# Patient Record
Sex: Male | Born: 1955 | Race: Black or African American | Hispanic: No | Smoking: Current every day smoker
Health system: Southern US, Community
[De-identification: ages and names within clinical notes are randomized; demographics above are authoritative.]

## PROBLEM LIST (undated history)

## (undated) DIAGNOSIS — M25562 Pain in left knee: Secondary | ICD-10-CM

## (undated) DIAGNOSIS — M549 Dorsalgia, unspecified: Secondary | ICD-10-CM

## (undated) DIAGNOSIS — G4733 Obstructive sleep apnea (adult) (pediatric): Secondary | ICD-10-CM

## (undated) DIAGNOSIS — B009 Herpesviral infection, unspecified: Secondary | ICD-10-CM

## (undated) DIAGNOSIS — M1990 Unspecified osteoarthritis, unspecified site: Secondary | ICD-10-CM

## (undated) DIAGNOSIS — A4902 Methicillin resistant Staphylococcus aureus infection, unspecified site: Secondary | ICD-10-CM

## (undated) DIAGNOSIS — I639 Cerebral infarction, unspecified: Secondary | ICD-10-CM

## (undated) DIAGNOSIS — M25561 Pain in right knee: Secondary | ICD-10-CM

## (undated) DIAGNOSIS — F1410 Cocaine abuse, uncomplicated: Secondary | ICD-10-CM

## (undated) HISTORY — DX: Herpesviral infection, unspecified: B00.9

## (undated) HISTORY — DX: Pain in right knee: M25.562

## (undated) HISTORY — PX: CYST REMOVAL NECK: SHX6281

## (undated) HISTORY — DX: Pain in right knee: M25.561

## (undated) HISTORY — DX: Cocaine abuse, uncomplicated: F14.10

## (undated) HISTORY — DX: Dorsalgia, unspecified: M54.9

---

## 2007-01-09 HISTORY — PX: HAND SURGERY: SHX662

## 2008-07-28 ENCOUNTER — Emergency Department (HOSPITAL_COMMUNITY): Admission: EM | Admit: 2008-07-28 | Discharge: 2008-07-28 | Payer: Self-pay | Admitting: Family Medicine

## 2008-10-04 ENCOUNTER — Emergency Department (HOSPITAL_COMMUNITY): Admission: EM | Admit: 2008-10-04 | Discharge: 2008-10-05 | Payer: Self-pay | Admitting: Emergency Medicine

## 2010-03-06 ENCOUNTER — Inpatient Hospital Stay (INDEPENDENT_AMBULATORY_CARE_PROVIDER_SITE_OTHER)
Admission: RE | Admit: 2010-03-06 | Discharge: 2010-03-06 | Disposition: A | Discharge status: Home / Self Care | Payer: Self-pay | Source: Ambulatory Visit | Attending: Family Medicine | Admitting: Family Medicine

## 2010-03-06 DIAGNOSIS — B354 Tinea corporis: Secondary | ICD-10-CM

## 2010-03-06 DIAGNOSIS — T148XXA Other injury of unspecified body region, initial encounter: Secondary | ICD-10-CM

## 2010-03-06 DIAGNOSIS — M549 Dorsalgia, unspecified: Secondary | ICD-10-CM

## 2010-06-22 ENCOUNTER — Observation Stay (HOSPITAL_COMMUNITY)
Admission: EM | Admit: 2010-06-22 | Discharge: 2010-06-23 | Disposition: A | Discharge status: Home / Self Care | Payer: Self-pay | Attending: Internal Medicine | Admitting: Internal Medicine

## 2010-06-22 DIAGNOSIS — F1010 Alcohol abuse, uncomplicated: Secondary | ICD-10-CM | POA: Insufficient documentation

## 2010-06-22 DIAGNOSIS — E7800 Pure hypercholesterolemia, unspecified: Secondary | ICD-10-CM | POA: Insufficient documentation

## 2010-06-22 DIAGNOSIS — R21 Rash and other nonspecific skin eruption: Secondary | ICD-10-CM | POA: Insufficient documentation

## 2010-06-22 DIAGNOSIS — F17200 Nicotine dependence, unspecified, uncomplicated: Secondary | ICD-10-CM | POA: Insufficient documentation

## 2010-06-22 DIAGNOSIS — R0789 Other chest pain: Principal | ICD-10-CM | POA: Insufficient documentation

## 2010-06-22 DIAGNOSIS — M25519 Pain in unspecified shoulder: Secondary | ICD-10-CM | POA: Insufficient documentation

## 2010-06-22 DIAGNOSIS — F1411 Cocaine abuse, in remission: Secondary | ICD-10-CM | POA: Insufficient documentation

## 2010-06-23 ENCOUNTER — Emergency Department (HOSPITAL_COMMUNITY): Payer: Self-pay

## 2010-06-23 ENCOUNTER — Inpatient Hospital Stay (HOSPITAL_COMMUNITY): Payer: Self-pay

## 2010-06-23 DIAGNOSIS — I200 Unstable angina: Secondary | ICD-10-CM

## 2010-06-23 LAB — COMPREHENSIVE METABOLIC PANEL
ALT: 16 U/L (ref 0–53)
AST: 17 U/L (ref 0–37)
Albumin: 3.8 g/dL (ref 3.5–5.2)
Alkaline Phosphatase: 70 U/L (ref 39–117)
BUN: 11 mg/dL (ref 6–23)
Calcium: 9.1 mg/dL (ref 8.4–10.5)
Calcium: 9.3 mg/dL (ref 8.4–10.5)
Creatinine, Ser: 0.67 mg/dL (ref 0.50–1.35)
Creatinine, Ser: 0.81 mg/dL (ref 0.4–1.5)
GFR calc Af Amer: >60 mL/min (ref >60)
Potassium: 3.5 mEq/L (ref 3.5–5.1)
Sodium: 138 mEq/L (ref 135–145)
Total Protein: 6.9 g/dL (ref 6.0–8.3)
Total Protein: 7.5 g/dL (ref 6.0–8.3)

## 2010-06-23 LAB — CARDIAC PANEL(CRET KIN+CKTOT+MB+TROPI)
CK, MB: 3.9 ng/mL (ref 0.3–4.0)
CK, MB: 3.6 ng/mL (ref 0.3–4.0)
Relative Index: 1.3 (ref 0.0–2.5)
Relative Index: 1.3 (ref 0.0–2.5)
Total CK: 309 U/L — ABNORMAL HIGH (ref 7–232)
Troponin I: <0.3 ng/mL (ref <0.30)

## 2010-06-23 LAB — CBC
HCT: 43 % (ref 39.0–52.0)
HCT: 44.1 % (ref 39.0–52.0)
Hemoglobin: 15.2 g/dL (ref 13.0–17.0)
RBC: 4.89 MIL/uL (ref 4.22–5.81)
RDW: 13.1 % (ref 11.5–15.5)
WBC: 6.2 10*3/uL (ref 4.0–10.5)
WBC: 7.1 10*3/uL (ref 4.0–10.5)

## 2010-06-23 LAB — LIPID PANEL
HDL: 53 mg/dL (ref >39)
LDL Cholesterol: 110 mg/dL — ABNORMAL HIGH (ref 0–99)
Total CHOL/HDL Ratio: 3.4 RATIO
Triglycerides: 83 mg/dL (ref <150)

## 2010-06-23 LAB — CK TOTAL AND CKMB (NOT AT ARMC)
CK, MB: 4 ng/mL (ref 0.3–4.0)
Relative Index: 1.1 (ref 0.0–2.5)

## 2010-06-23 LAB — LIPASE, BLOOD: Lipase: 19 U/L (ref 11–59)

## 2010-06-23 LAB — ETHANOL: Alcohol, Ethyl (B): 41 mg/dL — ABNORMAL HIGH (ref 0–11)

## 2010-07-03 NOTE — H&P (Signed)
NAME:  Harold Colon, BALDING               ACCOUNT NO.:  [id number]  MEDICAL RECORD NO.:  [id number]  LOCATION:                                 FACILITY:  PHYSICIAN:  Eduard Clos, MDDATE OF BIRTH:  10/12/1955  DATE OF ADMISSION: DATE OF DISCHARGE:                             HISTORY & PHYSICAL   PRIMARY CARE PHYSICIAN:  Not known.  The patient does not recall the name, previously is to be [facility] and he is not going to [facility] for more than 2 years now.  CHIEF COMPLAINT:  Chest pain.  HISTORY OF PRESENT ILLNESS:  A  55 year old male with known history of alcoholism and tobacco abuse, previous history of  drug abuse. He presented with complaint of chest pain.  The patient stated the chest pain started around 8:30 p.m. and lasted for 10 minutes with a left- sided anterior chest wall pressure like, he came to the ER.  In the ER, the patient's cardiac enzymes and EKG did not show anything acute.  Atthis time, the patient has been admitted for further workup for his chest pain.  Chest x-ray only showed bronchitic changes at the bases.  The patient denies any shortness of breath.  Denies any nausea, vomiting, abdominal pain, dysuria, discharge, or diarrhea.  Denies any dizziness, loss of consciousness, does have some difficulty moving his right upper extremity which caused intense pain in the shoulder when he does that.  It has been there like for a month's time.  PAST MEDICAL HISTORY: 1. He has some chronic abdominal rash for which he uses cream.  He     does not know the name of the cream. 2. Tobacco abuse and alcohol abuse.  PAST SURGICAL HISTORY:  He has had surgery for his hand.  MEDICATIONS ON ADMISSION:  He uses a cream for the abdominal rash.  SOCIAL HISTORY:  The patient is separated.  Smokes cigarettes, drinks beer every other day used to use cocaine.  He says he has not used cocaine for a year or two now.  FAMILY HISTORY:  Significant for  diabetes in his mom.  REVIEW OF SYSTEMS:  As per history of present illness, nothing else significant.  ALLERGIES:  No known drug allergies.  PHYSICAL EXAMINATION:  GENERAL:  The patient examined at bedside not in acute distress. VITAL SIGNS:  Blood pressure 120/86, pulse 70 per minute, temperature 97.1, respirations 18, and O2 sat 94%. HEENT:  Anicteric.  No pallor.  No discharge from ears, eyes, or mouth. CHEST:  Bilateral air entry present.  No rhonchi.  No crepitation. HEART:  S1 and S2 heard. ABDOMEN:  Soft, nontender.  Bowel sounds heard.  There is a rash in the lower part of the abdomen which he says it has been there for at least 4 weeks. CNS:  Patient is alert, awake, and oriented to time, place, and person. He is able to move upper and lower extremities but right upper extremity is limited because of the pain in the right shoulder. EXTREMITIES:  There is shoulder pain.  The right side and abduction in the right is only 45% because of the pain.  There is no acute  extremity changes, cyanosis, or clubbing.  LABORATORY DATA:  EKG shows normal sinus rhythm with nonspecific ST-T changes.  Heart rate is around 80 beats per minute.  I did discuss the case with cardiologist.  Chest x-ray shows bronchitic changes at the bases.  CBC:  WBC 7.1, hemoglobin is 14.9, hematocrit 43, platelets 201. Complete metabolic panel sodium 138, potassium 3.5, chloride 103, carbon dioxide 27, glucose of five, BUN 11 creatinine 0.8, total bilirubin is 0.2, alkaline phosphatase 70, AST 20, ALT 18, total protein 7.5, prealbumin 3.8, calcium of 9.3, CK is 351, MB is 4, index is 1.1, troponin less than 0.3.  Alcohol 41.  ASSESSMENT: 1. Chest pain. 2. History of alcohol and tobacco abuse with previous history of drug     abuse. 3. Chronic abdominal rash. 4. Right shoulder pain with decreased abduction of his right.  PLAN: 1. At this time, admit the patient to telemetry. 2. Chest pain.  At this  time, the patient  is chest pain free we will     cycle cardiac markers.  The patient on aspirin.  I am going to     check a D-dimer and lipase level.  We will get a 2-D Echo, check a     drug screen. 3. Alcohol abuse.  We will keep the patient on alcohol withdrawal     protocol use p.o. Ativan along with thiamine.  We will get an x-ray     of his right shoulder.  Further recommendation based on the test     order and clinic course.     Eduard Clos, MD     ANK/MEDQ  D:  06/23/2010  T:  06/23/2010  Job:  [id number]  Electronically Signed by Midge Minium MD on 07/03/2010 07:33:35 AM

## 2010-07-10 ENCOUNTER — Encounter: Payer: Self-pay | Admitting: Cardiovascular Disease

## 2010-07-10 ENCOUNTER — Encounter: Payer: Self-pay | Admitting: *Deleted

## 2010-07-10 NOTE — Discharge Summary (Signed)
NAME:  Harold Colon, Harold Colon               ACCOUNT NO.:  [id number]  MEDICAL RECORD NO.:  [id number]  LOCATION:  3736                         FACILITY:  [facility]  PHYSICIAN:  Marcellus Scott, MD     DATE OF BIRTH:  1955/08/13  DATE OF ADMISSION:  06/22/2010 DATE OF DISCHARGE:  06/23/2010                              DISCHARGE SUMMARY   PRIMARY CARE PHYSICIAN:  The patient does not have one, but has an appointment to be seen at [facility].  DISCHARGE DIAGNOSES: 1. Atypical chest pain, resolved.  Acute coronary artery syndrome     ruled out. 2. Tobacco abuse. 3. Alcohol use. 4. History of cocaine abuse. 5. Bilateral shoulder pains, chronic. 6. Lower abdominal rash.  DISCHARGE MEDICATIONS: 1. Enteric-coated aspirin 81 mg p.o. daily. 2. Tylenol 650 mg p.o. q.4 hourly p.r.n. for pain.  IMAGING: 1. Chest x-ray on June 23, 2010.  Impression, bronchitic changes at     the basis. 2. Right shoulder x-ray shows degenerative changes at the     acromioclavicular joint and rotator cuff insertion.  No acute     findings. 3. A 2-D echocardiogram shows mild left ventricular hypertrophy, left     ventricular ejection fraction 60%.  Normal wall motion and no     regional wall motion abnormalities.  LABORATORY DATA:  TSH 1.756, magnesium 1.9, lipase 19.  Cardiac enzymes cycled and only shows minimally elevated CK, but negative troponin and CK-MB.  LDL is 110.  Comprehensive metabolic panel only significant for albumin of 3.4, D-dimer negative at 0.30.  CBCs within normal limits. Blood alcohol level on admission was 41 mg/dL.  His EKG is normal sinus rhythm, normal axis, nonspecific ST-T changes.  CONSULTATIONS:  None.  DIET:  Heart-healthy.  ACTIVITIES:  Ad lib.  COMPLAINTS:  Mild right-sided shoulder pain which is chronic.  PHYSICAL EXAMINATION:  GENERAL:  The patient is in no obvious distress. VITAL SIGNS:  Temperature 97.4 degrees Fahrenheit, pulse 74 per minute, respirations  20 per minute, blood pressure 125/80 and saturating at 96% on room air. RESPIRATORY:  Clear. CARDIOVASCULAR:  First and second heart sounds heard regular. ABDOMEN:  Nondistended, nontender, soft and bowel sounds present.  The patient has a hypopigmented lichenified rash in the belt area in the anterior groin.  This appears chronic with no acute findings. CENTRAL NERVOUS SYSTEM:  The patient is awake, alert, oriented x3 with no focal neurological deficits.  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  He now presented with atypical chest pain.  He indicates that approximately 8:30 last night, he noticed gradual onset of left shoulder pain and then anterior chest pressure-like discomfort with no associated dyspnea or diaphoresis or wheezing or cough or fever.  He sat down on the chair. Elevated both his shoulders and relaxed and indicates that after about 10-15 minutes his chest pain subsided spontaneously and has not returned since.  He was concerned and  presented to the emergency room and was admitted to rule out acute coronary syndrome.  He indicates that he has chronic both shoulder pains right greater than the left.  He has had no further chest pain.  His vital signs are stable.  His EKG does not show any acute changes.  His echocardiogram findings are as indicated above. This dictator has discussed with [facility] cardiologist and we have agreed to get an outpatient stress test and followup with the [facility] cardiologist.  This dictator has called the [facility] coordinator who will arrange for the outpatient stress test and followup with Cardiology, MD. The patient has been counseled regarding  cessation from alcohol, tobacco and drug abuse.  He verbalizes understanding.  He indicates that he drinks 3 cans of beer maybe every other day and does not do any hard liquor.  DISPOSITION:  The patient will be discharged home in stable condition pending negative third set of cardiac enzymes.  FOLLOWUP RECOMMENDATIONS: 1. With [facility] on July 6 at 10:30 a.m. for     eligibility. 2. With Dr. Jolaine Click at [facility] on July 10 at 10:00     a.m. for followup appointment. 3. With [facility] Cardiology.  The MD's office will call with an     appointment for outpatient stress test and a cardiologist followup.  Time taken in coordinating this discharge is 30 minutes.     Marcellus Scott, MD     AH/MEDQ  D:  06/23/2010  T:  06/24/2010  Job:  [id number]  cc:   [facility]  Electronically Signed by Marcellus Scott MD on 07/10/2010 11:28:59 PM

## 2010-07-11 ENCOUNTER — Ambulatory Visit (INDEPENDENT_AMBULATORY_CARE_PROVIDER_SITE_OTHER): Payer: Self-pay | Admitting: Cardiovascular Disease

## 2010-07-11 ENCOUNTER — Encounter: Payer: Self-pay | Admitting: Cardiovascular Disease

## 2010-07-11 VITALS — BP 116/80 | HR 70 | Ht 73.0 in | Wt 247.0 lb

## 2010-07-11 DIAGNOSIS — R079 Chest pain, unspecified: Secondary | ICD-10-CM | POA: Insufficient documentation

## 2010-07-11 DIAGNOSIS — Z720 Tobacco use: Secondary | ICD-10-CM

## 2010-07-11 DIAGNOSIS — F17200 Nicotine dependence, unspecified, uncomplicated: Secondary | ICD-10-CM

## 2010-07-11 DIAGNOSIS — R072 Precordial pain: Secondary | ICD-10-CM

## 2010-07-11 NOTE — Assessment & Plan Note (Signed)
Tobacco cessation counseling done

## 2010-07-11 NOTE — Patient Instructions (Signed)
Your physician recommends that you schedule a follow-up appointment in: 2 months with Dr. Excell Seltzer.  Your physician has requested that you have en exercise stress myoview. For further information please visit [email or web address] Please follow instruction sheet, as given.

## 2010-07-11 NOTE — Progress Notes (Signed)
HPI:  This is a 55 year old gentleman referred for initial evaluation of chest pain.  He was hospitalized June 14 at [facility] for chest pain. The patient initially developed pain in his shoulder but he then described a pressure-like sensation across his anterior chest. He was ruled out for myocardial infarction. An echocardiogram demonstrated no regional wall motion abnormalities. He was discharged the following day and to followup stress test was recommended. The patient was not seen by cardiology in the hospital.  He continues to complain of right shoulder pain, predominantly with raising her right arm. He denies chest pain or pressure with exertion. He does admit to chronic dyspnea with exertion. He continues to smoke cigarettes. The patient has a history of cocaine use but has not used in a few years. He denies leg swelling, palpitations, lightheadedness, or syncope. He has no past history of cardiac disease.  Outpatient Encounter Prescriptions as of 07/11/2010  Medication Sig Dispense Refill  . aspirin 81 MG tablet Take 81 mg by mouth daily.          Review of patient's allergies indicates not on file.  Past Medical History  Diagnosis Date  . Chest pain   . Cocaine abuse   . Herpes     Past Surgical History  Procedure Date  . Hand surgery     History   Social History  . Marital Status: Legally Separated    Spouse Name: N/A    Number of Children: N/A  . Years of Education: N/A   Occupational History  . Not on file.   Social History Main Topics  . Smoking status: Former Smoker    Types: Cigarettes  . Smokeless tobacco: Not on file  . Alcohol Use: Not on file  . Drug Use: Not on file  . Sexually Active: Not on file   Other Topics Concern  . Not on file   Social History Narrative    The patient is separated.  Smokes cigarettes, drinks  beer every other day used to use cocaine.  He says he has not used  cocaine for a year or two now.     Family History  Problem  Relation Age of Onset  . Diabetes      ROS: General: no fevers/chills/night sweats Eyes: no blurry vision, diplopia, or amaurosis ENT: no sore throat or hearing loss Resp: no cough, wheezing, or hemoptysis CV: no edema or palpitations GI: no abdominal pain, nausea, vomiting, diarrhea, or constipation GU: no dysuria, frequency, or hematuria Skin: no rash Neuro: no headache, numbness, tingling, or weakness of extremities Musculoskeletal: no joint pain or swelling Heme: no bleeding, DVT, or easy bruising Endo: no polydipsia or polyuria  BP 116/80  Pulse 70  Ht 6\' 1"  (1.854 m)  Wt 247 lb (112.038 kg)  BMI 32.59 kg/m2  PHYSICAL EXAM: Pt is alert and oriented, WD, WN, her weight African American male in no distress. HEENT: normal Neck: JVP normal. Carotid upstrokes normal without bruits. No thyromegaly. Lungs: equal expansion, clear bilaterally CV: Apex is discrete and nondisplaced, RRR without murmur or gallop Abd: soft, NT, +BS, no bruit, no hepatosplenomegaly Back: no CVA tenderness Ext: no C/C/E        Femoral pulses 2+= without bruits        DP/PT pulses intact and = Skin: warm and dry without rash Neuro: CNII-XII intact             Strength intact = bilaterally  EKG:  Normal sinus rhythm with possible  anterior infarction age undetermined, heart rate 70 beats per minute.  ASSESSMENT AND PLAN:

## 2010-07-11 NOTE — Assessment & Plan Note (Signed)
Patient has chest pain with both typical and atypical features. His EKG is abnormal although nondiagnostic. He has cardiac vascular risk factors of tobacco abuse and obesity. There is a family history of congestive heart failure but no premature CAD. Recommend an exercise Myoview stress scan to rule out significant inducible ischemia. The patient should continue on antiplatelet therapy with aspirin. Further plans pending results of the stress test. His echocardiogram results were reviewed.

## 2010-07-18 ENCOUNTER — Encounter (HOSPITAL_COMMUNITY): Payer: Self-pay | Admitting: Radiology

## 2010-07-20 ENCOUNTER — Encounter (HOSPITAL_COMMUNITY): Payer: Self-pay | Admitting: Radiology

## 2010-07-20 ENCOUNTER — Ambulatory Visit (HOSPITAL_COMMUNITY): Payer: Self-pay | Attending: Cardiovascular Disease | Admitting: Radiology

## 2010-07-20 DIAGNOSIS — R0989 Other specified symptoms and signs involving the circulatory and respiratory systems: Secondary | ICD-10-CM

## 2010-07-26 ENCOUNTER — Ambulatory Visit (HOSPITAL_COMMUNITY): Payer: Self-pay | Attending: Cardiovascular Disease | Admitting: Radiology

## 2010-07-26 VITALS — Ht 73.0 in | Wt 240.0 lb

## 2010-07-26 DIAGNOSIS — R079 Chest pain, unspecified: Secondary | ICD-10-CM

## 2010-07-26 DIAGNOSIS — R0602 Shortness of breath: Secondary | ICD-10-CM

## 2010-07-26 DIAGNOSIS — R072 Precordial pain: Secondary | ICD-10-CM | POA: Insufficient documentation

## 2010-07-26 DIAGNOSIS — R9431 Abnormal electrocardiogram [ECG] [EKG]: Secondary | ICD-10-CM

## 2010-07-26 DIAGNOSIS — R0989 Other specified symptoms and signs involving the circulatory and respiratory systems: Secondary | ICD-10-CM

## 2010-07-26 MED ORDER — TECHNETIUM TC 99M TETROFOSMIN IV KIT: 33.0000 | PACK | Freq: Once | INTRAVENOUS | Status: AC | PRN

## 2010-07-26 MED ORDER — TECHNETIUM TC 99M TETROFOSMIN IV KIT
33.0000 | PACK | Freq: Once | INTRAVENOUS | Status: AC | PRN
  Administered 2010-07-26: 33 via INTRAVENOUS

## 2010-07-26 NOTE — Progress Notes (Signed)
[facility] SITE 3 NUCLEAR MED [address] [place] [address] [phone]  Cardiology Nuclear Med Study  Harold Colon is a 55 y.o. male [id number] 1955/05/09   Nuclear Med Background Indication for Stress Test:  Evaluation for Ischemia and [facility] 06/22/10 CP R/O MI History: 06/22/10 Echo: NL  Cardiac Risk Factors: Smoker  Symptoms:  Chest Pain, DOE, Palpitations and SOB   Nuclear Pre-Procedure Caffeine/Decaff Intake:  None NPO After: 7:00pm   Lungs:  clear IV 0.9% NS with Angio Cath:  22g  IV Site: R Hand  IV Started by:  Doyne Keel, CNMT  Chest Size (in):  48in Cup Size: n/a  Height: 6\' 1"  (1.854 m)  Weight:  240 lb (108.863 kg)  BMI:  Body mass index is 31.66 kg/(m^2). Tech Comments:  meds ok.  This patient had very blunted BP's.    Nuclear Med Study 1 or 2 day study: 2 day  Stress Test Type:  Stress  Reading MD: Marca Ancona, MD  Order Authorizing Provider:  Dayle Points, MD  Resting Radionuclide: Technetium 63m Tetrofosmin  Resting Radionuclide Dose: 33.0 mCi   Stress Radionuclide:  Technetium 37m Tetrofosmin  Stress Radionuclide Dose: 33.0 mCi           Stress Protocol Rest HR: 86 Stress HR: 148  Rest BP: 103/74 Stress BP: 169/101  Exercise Time (min): 9:00 METS: 10.40   Predicted Max HR: 167 bpm % Max HR: 88.62 bpm Rate Pressure Product: 21308   Dose of Adenosine (mg):  n/a Dose of Lexiscan: n/a mg  Dose of Atropine (mg): n/a Dose of Dobutamine: n/a mcg/kg/min (at max HR)  Stress Test Technologist: Milana Na, EMT-P  Nuclear Technologist:  Domenic Polite, CNMT     Rest Procedure:  Myocardial perfusion imaging was performed at rest 45 minutes following the intravenous administration of Technetium 55m Tetrofosmin. Rest ECG: NSR with non-specific ST-T wave changes  Stress Procedure:  The patient exercised for 9:00.  The patient stopped due to fatigue and denied any chest pain.  There were no significant ST-T wave changes. This  patient had very blunted BPs.  Technetium 45m Tetrofosmin was injected at peak exercise and myocardial perfusion imaging was performed after a brief delay. Stress ECG: No significant change from baseline ECG  QPS Raw Data Images:  Normal; no motion artifact; normal heart/lung ratio. Stress Images:  Small apical perfusion defect.  Rest Images:  Normal homogeneous uptake in all areas of the myocardium. Subtraction (SDS):  Small reversible apical perfusion defect. Transient Ischemic Dilatation (Normal <1.22):  0.63 Lung/Heart Ratio (Normal <0.45):  0.37  Quantitative Gated Spect Images QGS EDV:  82 ml QGS ESV:  29 ml  QGS cine images:    QGS EF: 64%  Impression Exercise Capacity:  Good exercise capacity. BP Response:  Blunted blood pressure response with exercise.  Clinical Symptoms:  Fatigue, no chest pain.  ECG Impression:  No significant ST segment change suggestive of ischemia. Comparison with Prior Nuclear Study: No images to compare  Overall Impression:  Low risk stress nuclear study.  Small reversible apical perfusion defect, ischemia versus shifting attenuation.  Normal LV systolic function.   [name] [employer]

## 2010-07-27 NOTE — Progress Notes (Addendum)
nuc med report routed to Dr. Excell Seltzer 07/27/10 Harold Colon  No significant ischemia seen on Myoview. No further CV workup required.

## 2010-08-02 NOTE — Progress Notes (Signed)
Low risk nuclear study. No further CV eval required.

## 2010-08-03 NOTE — Progress Notes (Signed)
Attempted to call pt with results but voicemail not set up and I was unable to leave message.  No other number listed for pt.

## 2010-09-06 NOTE — Progress Notes (Signed)
Have attempted times two to call pt but no voicemail set up.  Pt has appointment scheduled with Dr. Excell Seltzer on Sept. 4, 2012.

## 2010-09-12 ENCOUNTER — Ambulatory Visit: Payer: Self-pay | Admitting: Cardiovascular Disease

## 2010-09-13 ENCOUNTER — Encounter: Payer: Self-pay | Admitting: *Deleted

## 2010-09-13 NOTE — Progress Notes (Signed)
Patient no showed for appointment with Dr. Excell Seltzer on Sept. 4, 2012. Letter sent to patient asking him to contact our office to review results.

## 2011-06-08 ENCOUNTER — Encounter (HOSPITAL_COMMUNITY): Payer: Self-pay | Admitting: Emergency Medicine

## 2011-06-08 ENCOUNTER — Inpatient Hospital Stay (HOSPITAL_COMMUNITY)
Admission: EM | Admit: 2011-06-08 | Discharge: 2011-06-10 | DRG: 378 | Disposition: A | Discharge status: Home / Self Care | Payer: Self-pay | Attending: Family Medicine | Admitting: Family Medicine

## 2011-06-08 ENCOUNTER — Emergency Department (INDEPENDENT_AMBULATORY_CARE_PROVIDER_SITE_OTHER)
Admission: EM | Admit: 2011-06-08 | Discharge: 2011-06-08 | Disposition: A | Discharge status: Nursing Home (Includes ICF) | Payer: Self-pay | Source: Home / Self Care | Attending: Emergency Medicine | Admitting: Emergency Medicine

## 2011-06-08 ENCOUNTER — Encounter (HOSPITAL_COMMUNITY): Payer: Self-pay

## 2011-06-08 DIAGNOSIS — R000 Tachycardia, unspecified: Secondary | ICD-10-CM | POA: Diagnosis present

## 2011-06-08 DIAGNOSIS — IMO0002 Reserved for concepts with insufficient information to code with codable children: Secondary | ICD-10-CM | POA: Diagnosis present

## 2011-06-08 DIAGNOSIS — Z79899 Other long term (current) drug therapy: Secondary | ICD-10-CM

## 2011-06-08 DIAGNOSIS — G4733 Obstructive sleep apnea (adult) (pediatric): Secondary | ICD-10-CM | POA: Diagnosis present

## 2011-06-08 DIAGNOSIS — L0390 Cellulitis, unspecified: Secondary | ICD-10-CM

## 2011-06-08 DIAGNOSIS — K921 Melena: Secondary | ICD-10-CM

## 2011-06-08 DIAGNOSIS — K648 Other hemorrhoids: Secondary | ICD-10-CM | POA: Diagnosis present

## 2011-06-08 DIAGNOSIS — L03113 Cellulitis of right upper limb: Secondary | ICD-10-CM

## 2011-06-08 DIAGNOSIS — Z8719 Personal history of other diseases of the digestive system: Secondary | ICD-10-CM

## 2011-06-08 DIAGNOSIS — K922 Gastrointestinal hemorrhage, unspecified: Secondary | ICD-10-CM

## 2011-06-08 DIAGNOSIS — F1410 Cocaine abuse, uncomplicated: Secondary | ICD-10-CM | POA: Diagnosis present

## 2011-06-08 DIAGNOSIS — L0291 Cutaneous abscess, unspecified: Secondary | ICD-10-CM

## 2011-06-08 DIAGNOSIS — L739 Follicular disorder, unspecified: Secondary | ICD-10-CM | POA: Diagnosis present

## 2011-06-08 DIAGNOSIS — K644 Residual hemorrhoidal skin tags: Secondary | ICD-10-CM | POA: Diagnosis present

## 2011-06-08 DIAGNOSIS — K2980 Duodenitis without bleeding: Secondary | ICD-10-CM | POA: Diagnosis present

## 2011-06-08 DIAGNOSIS — F17200 Nicotine dependence, unspecified, uncomplicated: Secondary | ICD-10-CM | POA: Diagnosis present

## 2011-06-08 DIAGNOSIS — A4902 Methicillin resistant Staphylococcus aureus infection, unspecified site: Secondary | ICD-10-CM | POA: Diagnosis present

## 2011-06-08 HISTORY — DX: Obstructive sleep apnea (adult) (pediatric): G47.33

## 2011-06-08 HISTORY — DX: Personal history of other diseases of the digestive system: Z87.19

## 2011-06-08 HISTORY — DX: Methicillin resistant Staphylococcus aureus infection, unspecified site: A49.02

## 2011-06-08 LAB — DIFFERENTIAL
Basophils Absolute: 0 10*3/uL (ref 0.0–0.1)
Basophils Relative: 0 % (ref 0–1)
Eosinophils Absolute: 0.2 10*3/uL (ref 0.0–0.7)
Eosinophils Relative: 2 % (ref 0–5)
Lymphocytes Relative: 31 % (ref 12–46)
Lymphs Abs: 2.8 10*3/uL (ref 0.7–4.0)
Monocytes Absolute: 0.6 10*3/uL (ref 0.1–1.0)
Monocytes Relative: 7 % (ref 3–12)
Neutro Abs: 5.5 10*3/uL (ref 1.7–7.7)
Neutrophils Relative %: 60 % (ref 43–77)

## 2011-06-08 LAB — BASIC METABOLIC PANEL
BUN: 12 mg/dL (ref 6–23)
CO2: 28 mEq/L (ref 19–32)
Calcium: 9.5 mg/dL (ref 8.4–10.5)
Chloride: 99 mEq/L (ref 96–112)
Creatinine, Ser: 0.87 mg/dL (ref 0.50–1.35)
GFR calc Af Amer: >90 mL/min (ref >90)
GFR calc non Af Amer: >90 mL/min (ref >90)
Glucose, Bld: 126 mg/dL — ABNORMAL HIGH (ref 70–99)
Potassium: 3.7 mEq/L (ref 3.5–5.1)
Sodium: 137 mEq/L (ref 135–145)

## 2011-06-08 LAB — HEPATIC FUNCTION PANEL
ALT: 26 U/L (ref 0–53)
Alkaline Phosphatase: 69 U/L (ref 39–117)
Total Protein: 7.6 g/dL (ref 6.0–8.3)

## 2011-06-08 LAB — CBC
HCT: 41.2 % (ref 39.0–52.0)
Hemoglobin: 14.2 g/dL (ref 13.0–17.0)
MCH: 31.2 pg (ref 26.0–34.0)
MCHC: 34.5 g/dL (ref 30.0–36.0)
MCV: 90.5 fL (ref 78.0–100.0)
Platelets: 204 10*3/uL (ref 150–400)
RBC: 4.55 MIL/uL (ref 4.22–5.81)
RDW: 13.5 % (ref 11.5–15.5)
WBC: 9.1 10*3/uL (ref 4.0–10.5)

## 2011-06-08 LAB — OCCULT BLOOD, POC DEVICE: Fecal Occult Bld: POSITIVE

## 2011-06-08 LAB — PROTIME-INR: INR: 1.07 (ref 0.00–1.49)

## 2011-06-08 LAB — APTT: aPTT: 32 seconds (ref 24–37)

## 2011-06-08 MED ORDER — LIDOCAINE HCL (PF) 1 % IJ SOLN
INTRAMUSCULAR | Status: AC
  Filled 2011-06-08: qty 5

## 2011-06-08 MED ORDER — CEFTRIAXONE SODIUM 1 G IJ SOLR
1.0000 g | Freq: Once | INTRAMUSCULAR | Status: AC
  Administered 2011-06-08: 1 g via INTRAMUSCULAR

## 2011-06-08 NOTE — ED Notes (Signed)
Pt also being sent down primarily for GI bleed, onset ~ 2 weeks ago, started as bright red, now dark red, occult positive at [facility], given rocephin IM at [facility] for cellulitis.

## 2011-06-08 NOTE — H&P (Signed)
KRESTON AHRENDT is an 56 y.o. male.   Chief Complaint:GI bleed, cellulitis HPI: 56 yo male sent to ED from UC presents with right arm cellulitis and GI bleed.  Patient is a very poor historian.  Per pt, he had some gross rectal bleeding about 1 week ago after using a laxative for some mild constipation, and since that time his stools have become darker.  He reports no abdominal or rectal pain.  May have had one episode of a sharp pain in his lower abdomen one time.  No problems with diarrhea or constipation. Stools have been formed.  No nausea or vomiting. No decreased appetite, has been eating and drinking well.  No history of GI bleed or other GI illnesses, although after thinking about it, he may have had this happen before, but isn't sure, and doesn't think he even saw a medical professional.  No h/o diverticulitis. Does not feel like his abdomen is distended, but sometimes feels a little bloated after eating.  Denies EtOH use, but does take 81mg  ASA every day.  And may take 400-800mg  of motrin or 2 Aleve once a day for low back pain.  He denies dizziness, chest pain, SOB, syncope.  He has never had a colonoscopy.  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  Those lesions would come and go, with the cream maybe helping.  He squeezed a lesion on his right forearm about 1 week ago when it looked like a pimple, and now his right arm is red, painful, and swollen for the past few days.  No fevers or chills.  No sensory changes or weakness.  Has never had this before.  Does not think anyone else at the shelter has a rash.  Patient does have a h/o MRSA.   While at the urgent care, he got CTX x1 for his cellulitis, which he thinks improved the redness and swelling.  While in the ED, he had hemoccult positive stool, normal BMET, LFTs, CBC, INR.   Past Medical History  Diagnosis Date  . Chest pain   .  Cocaine abuse last use 2010  . Herpes   . MRSA (methicillin resistant Staphylococcus aureus)   . OSA (obstructive sleep apnea)     Past Surgical History  Procedure Date  . Hand surgery     Family History  Problem Relation Age of Onset  . Diabetes     Social History:  reports that he has quit smoking. His smoking use included Cigarettes. He does not have any smokeless tobacco history on file. He reports that he does not drink alcohol or use illicit drugs. H/o cocaine use, no use since 2010 per pt report.  Currently living at the [facility] No PCP  Allergies: No Known Allergies   (Not in a hospital admission)  Results for orders placed during the hospital encounter of 06/08/11 (from the past 48 hour(s))  CBC     Status: Normal   Collection Time   06/08/11  9:11 PM      Component Value Range Comment   WBC 9.1  4.0 - 10.5 (K/uL)    RBC 4.55  4.22 - 5.81 (MIL/uL)    Hemoglobin 14.2  13.0 - 17.0 (g/dL)    HCT 16.1  09.6 - 04.5 (%)    MCV 90.5  78.0 - 100.0 (fL)    MCH 31.2  26.0 - 34.0 (pg)    MCHC 34.5  30.0 - 36.0 (g/dL)    RDW 40.9  81.1 - 91.4 (%)    Platelets 204  150 - 400 (K/uL)   DIFFERENTIAL     Status: Normal   Collection Time   06/08/11  9:11 PM      Component Value Range Comment   Neutrophils Relative 60  43 - 77 (%)    Neutro Abs 5.5  1.7 - 7.7 (K/uL)    Lymphocytes Relative 31  12 - 46 (%)    Lymphs Abs 2.8  0.7 - 4.0 (K/uL)    Monocytes Relative 7  3 - 12 (%)    Monocytes Absolute 0.6  0.1 - 1.0 (K/uL)    Eosinophils Relative 2  0 - 5 (%)    Eosinophils Absolute 0.2  0.0 - 0.7 (K/uL)    Basophils Relative 0  0 - 1 (%)    Basophils Absolute 0.0  0.0 - 0.1 (K/uL)   BASIC METABOLIC PANEL     Status: Abnormal   Collection Time   06/08/11  9:11 PM      Component Value Range Comment   Sodium 137  135 - 145 (mEq/L)    Potassium 3.7  3.5 - 5.1 (mEq/L)    Chloride 99  96 - 112 (mEq/L)    CO2 28  19 - 32 (mEq/L)    Glucose, Bld 126 (*) 70 - 99  (mg/dL)    BUN 12  6 - 23 (mg/dL)    Creatinine, Ser 7.82  0.50 - 1.35 (mg/dL)    Calcium 9.5  8.4 - 10.5 (mg/dL)    GFR calc non Af Amer >90  >90 (mL/min)    GFR calc Af Amer >90  >90 (mL/min)   APTT     Status: Normal   Collection Time   06/08/11  9:16 PM      Component Value Range Comment   aPTT 32  24 - 37 (seconds)   PROTIME-INR     Status: Normal   Collection Time   06/08/11  9:16 PM      Component Value Range Comment   Prothrombin Time 14.1  11.6 - 15.2 (seconds)    INR 1.07  0.00 - 1.49    TYPE AND SCREEN     Status: Normal   Collection Time   06/08/11  9:25 PM      Component Value Range Comment   ABO/RH(D) O POS      Antibody Screen NEG      Sample Expiration 06/11/2011     HEPATIC FUNCTION PANEL     Status: Normal   Collection Time   06/08/11  9:36 PM      Component Value Range Comment   Total Protein 7.6  6.0 - 8.3 (g/dL)    Albumin 3.8  3.5 - 5.2 (g/dL)    AST 26  0 - 37 (U/L)    ALT 26  0 - 53 (U/L)    Alkaline Phosphatase 69  39 - 117 (U/L)    Total Bilirubin 0.3  0.3 - 1.2 (mg/dL)    Bilirubin, Direct <9.5  0.0 - 0.3 (mg/dL)    Indirect Bilirubin NOT CALCULATED  0.3 - 0.9 (mg/dL)    No results found.  Review of Systems  Constitutional: Negative for fever, chills, weight loss, malaise/fatigue and diaphoresis.  HENT: Negative for sore throat.   Respiratory: Negative for cough, hemoptysis, sputum production and shortness of breath.  Cardiovascular: Positive for leg swelling. Negative for chest pain and palpitations.  Gastrointestinal: Positive for blood in stool and melena. Negative for heartburn, nausea, vomiting, abdominal pain, diarrhea and constipation.  Genitourinary: Negative for dysuria.  Musculoskeletal: Positive for back pain. Negative for falls.  Skin: Positive for rash.  Neurological: Negative for dizziness, sensory change, focal weakness, weakness and headaches.    There were no vitals taken for this visit. Physical Exam  Constitutional: He  appears well-developed and well-nourished. No distress.  HENT:  Head: Normocephalic and atraumatic.  Right Ear: External ear normal.  Left Ear: External ear normal.  Nose: Nose normal.  Mouth/Throat: Oropharynx is clear and moist. No oropharyngeal exudate.  Eyes: Conjunctivae are normal. Pupils are equal, round, and reactive to light.  Neck: Normal range of motion. Neck supple. No thyromegaly present.  Cardiovascular: Regular rhythm, normal heart sounds and intact distal pulses.  Tachycardia present.   No murmur heard. Respiratory: Effort normal. No accessory muscle usage. No respiratory distress. He has wheezes.  GI: Soft. Bowel sounds are normal. He exhibits distension. He exhibits no ascites. There is no tenderness. There is no rebound and no guarding.  Genitourinary: Rectal exam shows external hemorrhoid. Guaiac positive stool.       Small, nonbleeding external hemorrhoids  >20 lesions of various stages over suprapubic, pubic, and groin areas- some papules, some healing lesion, no drainage or erythema, no fluctuance, no inguinal LAD  Musculoskeletal: Normal range of motion. He exhibits no edema and no tenderness.  Lymphadenopathy:    He has no cervical adenopathy.  Neurological: He is alert.  Skin: Skin is warm and dry. Rash noted. There is erythema.        Assessment/Plan 56 yo male with h/o MRSA p/w 5 day h/o rectal bleeding and 1 week h/o rash/cellulitis  1. GI bleed: nonbleeding external hemorrhoids, hemoccult +, denies EtOH use, has never had colonoscopy, does have chronic NSAID use -Admit to monitor overnight for hemodynamic stability  -Consider GI consult in AM --> more likely to be lower GI bleed and will need outpt colonoscopy, but GI may want to do EGD with h/o NSAID use -No abdominal pain or clear source of bleed -Hemoglobin stable, 14.2 (unchanged from 1 year ago); INR normal (1) -Will make NPO -Protonix IV -CT abd due to distension on exam   2. Cellulitis/rash:  pt with h/o MRSA -S/p CTX x1 at urgent care which seems to be helping per pt report -R arm has 1 lesion with surrounding cellulitis-like changes, no fluctuance or drainable abscess -groin area appears to be folliculitis and chronic in nature -Vancomycin while NPO, then will change to doxycycyline  3. Tachycardia: low 100s, unclear source -pt denies current cocaine use, but h/o -will get UDS -IVF for possible dehydration, although Cr normal and pt does not appear clinically dry  4. Tobacco use: 1 ppd -nicotine patch   FEN/GI: NPO (GI bleed), NS @ 100cc/hr Ppx: SCDs (GI bleed), protonix Dispo: admit to inpatient, monitor on tele, d/c pending clinical improvement  Silviano Neuser 06/08/2011, 10:24 PM

## 2011-06-08 NOTE — ED Provider Notes (Signed)
History     CSN: [id number]  Arrival date & time 06/08/11  2039   First MD Initiated Contact with Patient 06/08/11 2101      Chief Complaint  Patient presents with  . GI Bleeding  . Cellulitis    (Consider location/radiation/quality/duration/timing/severity/associated sxs/prior treatment) HPI Comments: Patient with a history of substance abuse of cocaine and recurrent skin infections presents emergency department from urgent care for evaluation of GI bleed and right arm cellulitis.  Patient states that about a week ago he began having gross bright blood in his stool and this has gradually dark and to current black stools.  Patient does have a history of external hemorrhoids and evaluation in urgent care included a positive Hemoccult study and no active bleeding on anoscopy.patient denies any abdominal pain, nausea, vomiting, fevers, night sweats, chills, diarrhea, or constipation.  In addition patient complains of right arm tenderness around previous abscess with surrounding erythema and tinea infection of the groin area.  Patient has no other complaints at this time.    The history is provided by the patient.    Past Medical History  Diagnosis Date  . Chest pain   . Cocaine abuse last use 2010  . Herpes   . MRSA (methicillin resistant Staphylococcus aureus)   . OSA (obstructive sleep apnea)     Past Surgical History  Procedure Date  . Hand surgery     Family History  Problem Relation Age of Onset  . Diabetes      History  Substance Use Topics  . Smoking status: Former Smoker    Types: Cigarettes  . Smokeless tobacco: Not on file  . Alcohol Use: No      Review of Systems  All other systems reviewed and are negative.    Allergies  Review of patient's allergies indicates no known allergies.  Home Medications  No current outpatient prescriptions on file.  There were no vitals taken for this visit.  Physical Exam  Constitutional: He is oriented to person,  place, and time. He appears well-developed and well-nourished. No distress.  HENT:  Head: Normocephalic and atraumatic.  Mouth/Throat: Oropharynx is clear and moist. No oropharyngeal exudate.  Eyes: EOM are normal. Pupils are equal, round, and reactive to light.  Neck: Normal range of motion. Neck supple. No tracheal deviation present. No thyromegaly present.  Cardiovascular: Regular rhythm, normal heart sounds and intact distal pulses.        Tachycardic, 116. No aberancy on auscultation.   Pulmonary/Chest: Effort normal and breath sounds normal. No stridor. No respiratory distress. He has no wheezes.  Abdominal:       Obese, distended abdomen, non rigid & no ttp. Non pulsatile aorta.   Genitourinary:       External hemorrhoids, not bleeding. Hyperpigmented skin with scattered pustules in groin area. No active draining. Scrotum non tender.   Musculoskeletal: Normal range of motion. He exhibits no edema and no tenderness.  Neurological: He is alert and oriented to person, place, and time. Coordination normal.  Skin: Skin is warm and dry. Rash noted. He is not diaphoretic. There is erythema. No pallor.       Right forearm with warmth and erythema, ttp, no fluctuance or purulent draining. Tender pustules in right axilla. Hx of MRSA  Psychiatric: He has a normal mood and affect. His behavior is normal.    ED Course  Procedures (including critical care time)   Labs Reviewed  CBC  DIFFERENTIAL  APTT  PROTIME-INR  TYPE AND  SCREEN  BASIC METABOLIC PANEL  HEPATIC FUNCTION PANEL   No results found.   No diagnosis found.    MDM  Cellulitis, tachycardia, GI bleed (+ hemoccult via UCC)  The patient appears reasonably stabilized for admission considering the current resources, flow, and capabilities available in the ED at this time, and I doubt any other [facility] requiring further screening and/or treatment in the ED prior to admission.         Jaci Carrel, PA-C 06/08/11  [address], PA-C 06/08/11 2305

## 2011-06-08 NOTE — ED Notes (Signed)
PT. SENT FROM [facility] FOR FURTHER EVALUATION OF RIGHT FOREARM SWELLING ONSET THIS MORNING . DENIES FEVER .

## 2011-06-08 NOTE — ED Notes (Signed)
Pt called this RN back into room, states "I did have some pain in my stomach after I ate some food a couple days ago." Very vague.

## 2011-06-08 NOTE — ED Notes (Signed)
Pt c/o abcess to R forearm onset approx 5 days ago.  Pt also c/o generalized bumps to L axillae and L groin and surpapubic abdomen.

## 2011-06-08 NOTE — ED Provider Notes (Signed)
History     CSN: [id number]  Arrival date & time 06/08/11  8469   First MD Initiated Contact with Patient 06/08/11 1832      Chief Complaint  Patient presents with  . Recurrent Skin Infections    (Consider location/radiation/quality/duration/timing/severity/associated sxs/prior treatment) HPI Comments: Patient presents with about one week of multiple pustular lesions as starting in his groin, now on his right trunk, right leg the. States that he quotes "squoze" a lesion on his right forearm, and now his entire forearm is erythematous, painful, and swollen. No weakness, numbness, fevers. He is not a diabetic.  He also reports painless bloody bowel movements for the past 4 days. States it started out as bright red, and has now progressively become darker. Patient states that it is now dark red, almost black in color. He reports some upper abdominal "fullness" but no abdominal pain. No abdominal distention. No urinary complaints. No weakness, chest pain, shortness of breath, presyncope or syncope. He is a smoker, and has a history of cocaine abuse, last use 3 years ago. He does not use NSAIDs, and denies any other medications. He is currently on any aspirin 81 mg daily for atypical chest pain. Family history significant for father having colon cancer. Of note, patient is currently living at the [facility].  ROS as noted in HPI. All other ROS negative.   Patient is a 56 y.o. male presenting with rash and hematochezia. The history is provided by the patient. No language interpreter was used.  Rash  This is a new problem. The current episode started more than 2 days ago. The problem has been gradually worsening. The problem is associated with an unknown factor. There has been no fever. The rash is present on the groin, right arm and trunk. Associated symptoms include pain. Pertinent negatives include no blisters, no itching and no weeping. He has tried nothing for the symptoms. The  treatment provided no relief.  Rectal Bleeding  The current episode started 3 to 5 days ago. The onset was sudden. The problem has been gradually worsening. The patient is experiencing no pain. The stool is described as bloody. There was no prior successful therapy. There was no prior unsuccessful therapy. Associated symptoms include hemorrhoids and rash. Pertinent negatives include no anorexia, no fever, no abdominal pain, no diarrhea, no hematemesis, no nausea, no rectal pain, no vomiting, no chest pain and no difficulty breathing. His past medical history does not include abdominal surgery, inflammatory bowel disease, recent abdominal injury, recent antibiotic use, recent change in diet or a recent illness.    Past Medical History  Diagnosis Date  . Chest pain   . Cocaine abuse last use 2010  . Herpes   . MRSA (methicillin resistant Staphylococcus aureus)   . OSA (obstructive sleep apnea)     Past Surgical History  Procedure Date  . Hand surgery     Family History  Problem Relation Age of Onset  . Diabetes      History  Substance Use Topics  . Smoking status: Former Smoker    Types: Cigarettes  . Smokeless tobacco: Not on file  . Alcohol Use: No      Review of Systems  Constitutional: Negative for fever.  Cardiovascular: Negative for chest pain.  Gastrointestinal: Positive for hematochezia and hemorrhoids. Negative for nausea, vomiting, abdominal pain, diarrhea, rectal pain, anorexia and hematemesis.  Skin: Positive for rash. Negative for itching.    Allergies  Review of patient's allergies indicates no known  allergies.  Home Medications   Current Outpatient Rx  Name Route Sig Dispense Refill  . ASPIRIN 81 MG PO TABS Oral Take 81 mg by mouth daily.        BP 143/92  Pulse 116  Temp(Src) 99.3 F (37.4 C) (Oral)  Resp 28  SpO2 98%  Physical Exam  Nursing note and vitals reviewed. Constitutional: He is oriented to person, place, and time. He appears  well-developed and well-nourished.  HENT:  Head: Normocephalic and atraumatic.  Eyes: Conjunctivae and EOM are normal.  Neck: Normal range of motion.  Cardiovascular: Regular rhythm and normal heart sounds.  Tachycardia present.   Pulmonary/Chest: Effort normal and breath sounds normal. No respiratory distress.  Abdominal: Soft. Normal appearance. He exhibits no distension. Bowel sounds are decreased. There is no tenderness. There is no rigidity, no rebound, no guarding and no CVA tenderness.       obese  Genitourinary: Rectal exam shows external hemorrhoid. Rectal exam shows no internal hemorrhoid, no fissure, no mass, no tenderness and anal tone normal.          Symmetric, flat, hyperpigmented rash consistent with tinea in groin. Several scattered pustules and groin as well. No active bleeding seen on anoscopy. Soft stool in rectal vault  Musculoskeletal: Normal range of motion.  Neurological: He is alert and oriented to person, place, and time.  Skin: Skin is warm and dry.       Erythema, tenderness, increased temperature right forearm. No lymphangitis. Sensation grossly intact. RP 2+. Skin is currently intact.  Multiple tender pustules in right axilla. No expressible purulent drainage. Scattered pustules on right chest and torso, c/w MRSA infection.  Psychiatric: He has a normal mood and affect. His behavior is normal.    ED Course  Procedures (including critical care time)   Labs Reviewed  OCCULT BLOOD, POC DEVICE  OCCULT BLOOD X 1 CARD TO LAB, STOOL   No results found.   1. Cellulitis of arm, right   2. GIB (gastrointestinal bleeding)     MDM  Patient originally presented for skin infection in his groin, arm, chest which appears to be multiple MRSA infections. He has a cellulitis of his right arm. Gave 1 g of Rocephin for this. He also has a history that is concerning for GI bleed.. He has external hemorrhoids, which are not bleeding. No active bleeding seen on anoscopy.  Hemoccult was positive. Abdomen is otherwise benign. He is tachycardic, but normotensive. Afebrile. Transferring to the ER to rule out lower GI bleed. He may also benefit from the cellulitis protocol.   Luiz Blare, MD 06/08/11 2031

## 2011-06-08 NOTE — ED Notes (Signed)
Pt states he has been having red-reddish/black bm x2wks. Denies abd pain. States his stomach feels more bloated than normal, abd appears distended. Pt states his rash pain "comes and goes and hurts at times." Vesicular rash noted to chest, right arm and axilla, and groin.

## 2011-06-09 ENCOUNTER — Inpatient Hospital Stay (HOSPITAL_COMMUNITY): Payer: Self-pay

## 2011-06-09 ENCOUNTER — Encounter (HOSPITAL_COMMUNITY): Payer: Self-pay | Admitting: Radiology

## 2011-06-09 LAB — BASIC METABOLIC PANEL
BUN: 10 mg/dL (ref 6–23)
CO2: 25 mEq/L (ref 19–32)
GFR calc non Af Amer: >90 mL/min (ref >90)
Glucose, Bld: 97 mg/dL (ref 70–99)
Potassium: 3.9 mEq/L (ref 3.5–5.1)
Sodium: 138 mEq/L (ref 135–145)

## 2011-06-09 LAB — RAPID URINE DRUG SCREEN, HOSP PERFORMED
Amphetamines: NOT DETECTED
Barbiturates: NOT DETECTED
Benzodiazepines: NOT DETECTED

## 2011-06-09 LAB — MRSA PCR SCREENING: MRSA by PCR: POSITIVE — AB

## 2011-06-09 LAB — CBC
Hemoglobin: 14 g/dL (ref 13.0–17.0)
MCH: 30.6 pg (ref 26.0–34.0)
MCHC: 33.9 g/dL (ref 30.0–36.0)
MCV: 90.4 fL (ref 78.0–100.0)
RBC: 4.57 MIL/uL (ref 4.22–5.81)

## 2011-06-09 MED ORDER — ACETAMINOPHEN 650 MG RE SUPP: 650.0000 mg | Freq: Four times a day (QID) | RECTAL | Status: DC | PRN

## 2011-06-09 MED ORDER — NICOTINE 14 MG/24HR TD PT24
14.0000 mg | MEDICATED_PATCH | Freq: Every day | TRANSDERMAL | Status: DC
  Administered 2011-06-09: 14 mg via TRANSDERMAL
  Filled 2011-06-09 (×2): qty 1

## 2011-06-09 MED ORDER — SODIUM CHLORIDE 0.9 % IJ SOLN: 3.0000 mL | Freq: Two times a day (BID) | INTRAMUSCULAR | Status: DC

## 2011-06-09 MED ORDER — MUPIROCIN 2 % EX OINT
1.0000 "application " | TOPICAL_OINTMENT | Freq: Two times a day (BID) | CUTANEOUS | Status: DC
  Administered 2011-06-09 – 2011-06-10 (×3): 1 via NASAL
  Filled 2011-06-09: qty 22

## 2011-06-09 MED ORDER — ONDANSETRON HCL 4 MG PO TABS: 4.0000 mg | ORAL_TABLET | Freq: Four times a day (QID) | ORAL | Status: DC | PRN

## 2011-06-09 MED ORDER — VANCOMYCIN HCL 1000 MG IV SOLR
1250.0000 mg | Freq: Two times a day (BID) | INTRAVENOUS | Status: DC
  Administered 2011-06-09: 1250 mg via INTRAVENOUS
  Filled 2011-06-09 (×3): qty 1250

## 2011-06-09 MED ORDER — ONDANSETRON HCL 4 MG/2ML IJ SOLN: 4.0000 mg | Freq: Four times a day (QID) | INTRAMUSCULAR | Status: DC | PRN

## 2011-06-09 MED ORDER — CEPHALEXIN 500 MG PO CAPS
500.0000 mg | ORAL_CAPSULE | Freq: Four times a day (QID) | ORAL | Status: DC
  Administered 2011-06-09: 500 mg via ORAL
  Filled 2011-06-09: qty 2
  Filled 2011-06-09 (×5): qty 1

## 2011-06-09 MED ORDER — CHLORHEXIDINE GLUCONATE CLOTH 2 % EX PADS
6.0000 | MEDICATED_PAD | Freq: Every day | CUTANEOUS | Status: DC
  Administered 2011-06-09 – 2011-06-10 (×2): 6 via TOPICAL

## 2011-06-09 MED ORDER — ACETAMINOPHEN 325 MG PO TABS: 650.0000 mg | ORAL_TABLET | Freq: Four times a day (QID) | ORAL | Status: DC | PRN

## 2011-06-09 MED ORDER — IOHEXOL 300 MG/ML  SOLN
100.0000 mL | Freq: Once | INTRAMUSCULAR | Status: AC | PRN
  Administered 2011-06-09: 100 mL via INTRAVENOUS

## 2011-06-09 MED ORDER — PEG 3350-KCL-NA BICARB-NACL 420 G PO SOLR
4000.0000 mL | Freq: Once | ORAL | Status: AC
  Administered 2011-06-09: 4000 mL via ORAL
  Filled 2011-06-09: qty 4000

## 2011-06-09 MED ORDER — VANCOMYCIN HCL 1000 MG IV SOLR
2000.0000 mg | Freq: Once | INTRAVENOUS | Status: AC
  Administered 2011-06-09: 2000 mg via INTRAVENOUS
  Filled 2011-06-09: qty 2000

## 2011-06-09 MED ORDER — SODIUM CHLORIDE 0.9 % IV SOLN
INTRAVENOUS | Status: DC
  Administered 2011-06-09 (×2): via INTRAVENOUS
  Administered 2011-06-09: 100 mL/h via INTRAVENOUS

## 2011-06-09 MED ORDER — PNEUMOCOCCAL VAC POLYVALENT 25 MCG/0.5ML IJ INJ
0.5000 mL | INJECTION | INTRAMUSCULAR | Status: DC
  Filled 2011-06-09: qty 0.5

## 2011-06-09 MED ORDER — PANTOPRAZOLE SODIUM 40 MG IV SOLR
40.0000 mg | Freq: Two times a day (BID) | INTRAVENOUS | Status: DC
  Administered 2011-06-09 – 2011-06-10 (×3): 40 mg via INTRAVENOUS
  Filled 2011-06-09 (×4): qty 40

## 2011-06-09 NOTE — Progress Notes (Signed)
Seen and examined.  See my note for today which is in my co sign of the H and PE

## 2011-06-09 NOTE — Consult Note (Signed)
Referring Provider: Dr. Leveda Anna Primary Care Physician:  No primary provider on file. Primary Gastroenterologist:  Gentry Fitz  Reason for Consultation:  GI bleed (melena)  HPI: Harold Colon is a 56 y.o. male admitted for cellulitis, who reports having onset of red blood per rectum about a week ago followed by black stools (formed) daily for a week. Bleeding started after taking a laxative (epson salts) for constipation. Denies any associated N/V/abdominal pain/heartburn/dysphagia. He reports rare episodes of burning in his throat when he lies down to sleep. Denies daily NSAIDs but states he would take an aspirin each day until a while ago. Occasional Aleve/Advil but denies any daily NSAID use. Denies any black stools since being at urgent care/ER. Hgb stable at 14. History of cocaine use none since 2010 per chart history. Denies alcohol use. Never had a colonoscopy. Reports history of external hemorrhoids.  Past Medical History  Diagnosis Date  . Chest pain   . Cocaine abuse last use 2010  . Herpes   . MRSA (methicillin resistant Staphylococcus aureus)   . OSA (obstructive sleep apnea)     Past Surgical History  Procedure Date  . Hand surgery     Prior to Admission medications   Not on File    Scheduled Meds:   . Chlorhexidine Gluconate Cloth  6 each Topical Q0600  . mupirocin ointment  1 application Nasal BID  . nicotine  14 mg Transdermal Daily  . pantoprazole (PROTONIX) IV  40 mg Intravenous Q12H  . pneumococcal 23 valent vaccine  0.5 mL Intramuscular Tomorrow-1000  . sodium chloride  3 mL Intravenous Q12H  . vancomycin  1,250 mg Intravenous Q12H  . vancomycin  2,000 mg Intravenous Once  . DISCONTD: cephALEXin  500 mg Oral Q6H   Continuous Infusions:   . sodium chloride 100 mL/hr at 06/09/11 1008   PRN Meds:.acetaminophen, acetaminophen, iohexol, ondansetron (ZOFRAN) IV, ondansetron  Allergies as of 06/08/2011  . (No Known Allergies)    Family History  Problem  Relation Age of Onset  . Diabetes      History   Social History  . Marital Status: Legally Separated    Spouse Name: N/A    Number of Children: N/A  . Years of Education: N/A   Occupational History  . Not on file.   Social History Main Topics  . Smoking status: Former Smoker    Types: Cigarettes  . Smokeless tobacco: Not on file  . Alcohol Use: No  . Drug Use: No     h/o cocaine abuse last use 2010  . Sexually Active: Not on file   Other Topics Concern  . Not on file   Social History Narrative    The patient is separated.  Smokes cigarettes, drinks  beer every other day used to use cocaine.  He says he has not used  cocaine for a year or two now.     Review of Systems: All negative except as stated above in HPI.  Physical Exam: Vital signs: Filed Vitals:   06/09/11 0600  BP: 115/80  Pulse: 84  Temp: 98.4 F (36.9 C)  Resp: 16   Last BM Date: 06/08/11 General:   Alert,  Well-developed, well-nourished, pleasant and cooperative in NAD Lungs:  Clear throughout to auscultation.   No wheezes, crackles, or rhonchi. No acute distress. Heart:  Regular rate and rhythm; no murmurs, clicks, rubs,  or gallops. Abdomen: soft, nontender, nondistended, positive bowel sounds  Rectal:  Deferred  GI:  Lab Results:  Basename 06/09/11 0515 06/08/11 2111  WBC 7.9 9.1  HGB 14.0 14.2  HCT 41.3 41.2  PLT 210 204   BMET  Basename 06/09/11 0515 06/08/11 2111  NA 138 137  K 3.9 3.7  CL 101 99  CO2 25 28  GLUCOSE 97 126*  BUN 10 12  CREATININE 0.74 0.87  CALCIUM 9.3 9.5   LFT  Basename 06/08/11 2136  PROT 7.6  ALBUMIN 3.8  AST 26  ALT 26  ALKPHOS 69  BILITOT 0.3  BILIDIR <0.1  IBILI NOT CALCULATED   PT/INR  Basename 06/08/11 2116  LABPROT 14.1  INR 1.07     Studies/Results: Ct Abdomen Pelvis W Contrast  06/09/2011  *RADIOLOGY REPORT*  Clinical Data: Abdominal distension.  CT ABDOMEN AND PELVIS WITH CONTRAST  Technique:  Multidetector CT imaging of the  abdomen and pelvis was performed following the standard protocol during bolus administration of intravenous contrast.  Contrast:  100 ml Omnipaque 300  Comparison: None.  Findings: The mild dependent changes in the lung bases.  The gallbladder is contracted, likely due to nonfasting state.  The liver, spleen, pancreas, adrenal glands, and retroperitoneal lymph nodes are unremarkable.  Calcification of the abdominal aorta without aneurysm.  Small sub centimeter parenchymal cysts in the kidneys.  No evidence of solid mass or hydronephrosis.  The stomach, small bowel, and colon are not distended.  The colon is diffusely stool filled without apparent wall thickening.  No free air or free fluid in the abdomen.  Pelvis:  The prostate gland is not enlarged.  No bladder wall thickening.  Small lipoma in the inferior right rectus abdominous muscle.  No free fluid or free air in the pelvis.  The appendix is normal.  No evidence of diverticulitis.  Normal alignment of the lumbar vertebrae with degenerative changes present.  IMPRESSION: No acute process demonstrated in the abdomen or pelvis.  Original Report Authenticated By: Marlon Pel, M.D.    Impression/Plan: 56yo male admitted for GI bleed and cellulitis who has been having melena for the past week. Will plan to do a colonoscopy/egd tomorrow and prep today. Question peptic ulcer disease but needs colonoscopy as well for screening purposes. Doubt he would f/u as an outpt. Also, ischemic colitis also in differential especially  if he has restarted back on cocaine (he denies and UDS negative).    LOS: 1 day   Kary Sugrue C.  06/09/2011, 2:13 PM

## 2011-06-09 NOTE — H&P (Signed)
Seen and examined.  Chart reviewed.  Discussed with both Dr. Fara Boros and Katrinka Blazing. Agree with their management.  Briefly  56 yo male with hx of MRSA presents with two distinct problems.  #1 Multi focal skin infection.  Most pronounced is cellulitis of Rt arm.  Also has folliculitis of pubic area and both axilla.  He has on small folliculitis area on rt shin.  + MRSA again.  Suspect all related.  He has had mild recurrent problems of axilla and groin in the past raising the possibility of hydradenitis suppritivum.  Regardless, no change in management.  #2 GI bleed, hemodynamically stable.  Never had colon cancer screen.  Also, recent NSAID use.  We have consulted GI and I understand they plan both EGD and colonoscopy tomorrow.  Greatly appreciate their help.

## 2011-06-09 NOTE — Progress Notes (Signed)
ANTIBIOTIC CONSULT NOTE - INITIAL  Pharmacy Consult for vancomycin Indication: r/o MRSA cellulitis  No Known Allergies  Patient Measurements: Height: 6\' 1"  (185.4 cm) Weight: 274 lb 7.6 oz (124.5 kg) IBW/kg (Calculated) : 79.9    Vital Signs: Temp: 98.4 F (36.9 C) (06/01 0600) Temp src: Oral (06/01 0256) BP: 115/80 mmHg (06/01 0600) Pulse Rate: 84  (06/01 0600) Intake/Output from previous day:   Intake/Output from this shift:    Labs:  Basename 06/09/11 0515 06/08/11 2111  WBC 7.9 9.1  HGB 14.0 14.2  PLT 210 204  LABCREA -- --  CREATININE 0.74 0.87   Estimated Creatinine Clearance: 145.9 ml/min (by C-G formula based on Cr of 0.74). No results found for this basename: VANCOTROUGH:2,VANCOPEAK:2,VANCORANDOM:2,GENTTROUGH:2,GENTPEAK:2,GENTRANDOM:2,TOBRATROUGH:2,TOBRAPEAK:2,TOBRARND:2,AMIKACINPEAK:2,AMIKACINTROU:2,AMIKACIN:2, in the last 72 hours   Microbiology: No results found for this or any previous visit (from the past 720 hour(s)).  Medical History: Past Medical History  Diagnosis Date  . Chest pain   . Cocaine abuse last use 2010  . Herpes   . MRSA (methicillin resistant Staphylococcus aureus)   . OSA (obstructive sleep apnea)     Medications:  No prescriptions prior to admission   Assessment:  56 yo man, per MD note " Cellulitis/rash: pt with h/o MRSA  -S/p CTX x1 at urgent care which seems to be helping per pt report  -R arm has 1 lesion with surrounding cellulitis-like changes, no fluctuance or drainable abscess  -groin area appears to be folliculitis and chronic in nature  -Vancomycin while NPO, then will change to doxycycyline". His weight is 124.5 kg.  Creat cl > 100 ml/min. Goal of Therapy Vancomycin trough level 10-15 mcg/ml  Plan:  1. Vancomycin 2000 mg IV loading dose 2. Vancomycin 1250 mg IV q12h  3. MD plans to change to oral doxycycline once pt able to take PO Herby Abraham, Pharm.D. [phone] 06/09/2011 7:37 AM

## 2011-06-09 NOTE — ED Provider Notes (Signed)
Medical screening examination/treatment/procedure(s) were conducted as a shared visit with non-physician practitioner(s) and myself.  I personally evaluated the patient during the encounter   Chaya Dehaan, MD 06/09/11 0009 

## 2011-06-09 NOTE — Progress Notes (Signed)
Subjective: Patient states arm still hurts but is feeling a little better.    Objective: Vital signs in last 24 hours: Temp:  [97.7 F (36.5 C)-99.3 F (37.4 C)] 98.4 F (36.9 C) (06/01 0600) Pulse Rate:  [79-116] 84  (06/01 0600) Resp:  [16-28] 16  (06/01 0600) BP: (115-143)/(80-92) 115/80 mmHg (06/01 0600) SpO2:  [93 %-98 %] 97 % (06/01 0600) Weight:  [124.5 kg (274 lb 7.6 oz)] 124.5 kg (274 lb 7.6 oz) (06/01 0310) Weight change:  Last BM Date: 06/08/11  Intake/Output from previous day:   Intake/Output this shift:    Constitutional: He appears well-developed and well-nourished. No distress.  Cardiovascular: Regular rhythm, normal heart sounds and intact distal pulses No murmur heard.  Respiratory: Effort normal. No accessory muscle usage. No respiratory distress.  GI: Soft. Bowel sounds are normal. He exhibits distension. He exhibits no ascites. There is no tenderness. There is no rebound and no guarding.  Guaiac positive stool.  >20 lesions of various stages over suprapubic, pubic, and groin areas- some papules, some healing lesion, no drainage or erythema, no fluctuance, no inguinal LAD  Skin: Skin is warm and dry. Right arm still warm to touch, mild erythema, per H&P note, seems to be improving.    Lab Results:  Basename 06/09/11 0515 06/08/11 2111  WBC 7.9 9.1  HGB 14.0 14.2  HCT 41.3 41.2  PLT 210 204   BMET  Basename 06/09/11 0515 06/08/11 2111  NA 138 137  K 3.9 3.7  CL 101 99  CO2 25 28  GLUCOSE 97 126*  BUN 10 12  CREATININE 0.74 0.87  CALCIUM 9.3 9.5    Studies/Results: Ct Abdomen Pelvis W Contrast  06/09/2011  *RADIOLOGY REPORT*  Clinical Data: Abdominal distension.  CT ABDOMEN AND PELVIS WITH CONTRAST  Technique:  Multidetector CT imaging of the abdomen and pelvis was performed following the standard protocol during bolus administration of intravenous contrast.  Contrast:  100 ml Omnipaque 300  Comparison: None.  Findings: The mild dependent changes  in the lung bases.  The gallbladder is contracted, likely due to nonfasting state.  The liver, spleen, pancreas, adrenal glands, and retroperitoneal lymph nodes are unremarkable.  Calcification of the abdominal aorta without aneurysm.  Small sub centimeter parenchymal cysts in the kidneys.  No evidence of solid mass or hydronephrosis.  The stomach, small bowel, and colon are not distended.  The colon is diffusely stool filled without apparent wall thickening.  No free air or free fluid in the abdomen.  Pelvis:  The prostate gland is not enlarged.  No bladder wall thickening.  Small lipoma in the inferior right rectus abdominous muscle.  No free fluid or free air in the pelvis.  The appendix is normal.  No evidence of diverticulitis.  Normal alignment of the lumbar vertebrae with degenerative changes present.  IMPRESSION: No acute process demonstrated in the abdomen or pelvis.  Original Report Authenticated By: Marlon Pel, M.D.    Medications: I have reviewed the patient's current medications.  Assessment/Plan:  56 yo male with h/o MRSA p/w 5 day h/o rectal bleeding and 1 week h/o rash/cellulitis   1. GI bleed: nonbleeding external hemorrhoids, hemoccult +, denies EtOH use, has never had colonoscopy, does have chronic NSAID use  -GI consulted will see patient in next 2 days. Dr, Bosie Clos of [facility] paged.  -No abdominal pain or clear source of bleed now with more melena though so more concern for upper bleed.  -Hemoglobin stable, 14.2-->14.0 (unchanged from  1 year ago); INR normal (1)  -NPO still until Gi sees patient will advance accordingly.  -Protonix IV  -CT abd due to distension on exam was negative  2. Cellulitis/rash: pt with h/o MRSA  -on vanc pharm following.   -groin area appears to be folliculitis and chronic in nature  -Vancomycin while NPO, then will change to doxycycyline   3. Tachycardia: improved likley was from some dehydration -pt denies current cocaine use, UDS  pending   -IVF for possible dehydration, although Cr normal and pt does not appear clinically dry   4. Tobacco use: 1 ppd  -nicotine patch  FEN/GI: NPO (GI bleed), NS @ 100cc/hr  Ppx: SCDs (GI bleed), protonix  Dispo:pending clinical improvement and further work up.    LOS: 1 day   [place],Shawan Tosh 06/09/2011, 9:28 AM

## 2011-06-09 NOTE — ED Notes (Signed)
Pt back from CT

## 2011-06-09 NOTE — ED Notes (Signed)
Pt has finished drinking contrast. CT notified. 

## 2011-06-10 ENCOUNTER — Encounter (HOSPITAL_COMMUNITY)
Admission: EM | Disposition: A | Discharge status: Home / Self Care | Payer: Self-pay | Source: Home / Self Care | Attending: Family Medicine

## 2011-06-10 HISTORY — PX: ESOPHAGOGASTRODUODENOSCOPY: SHX5428

## 2011-06-10 HISTORY — PX: COLONOSCOPY: SHX5424

## 2011-06-10 LAB — CBC
HCT: 41.1 % (ref 39.0–52.0)
Hemoglobin: 14.1 g/dL (ref 13.0–17.0)
MCH: 31.1 pg (ref 26.0–34.0)
MCHC: 34.3 g/dL (ref 30.0–36.0)
MCV: 90.7 fL (ref 78.0–100.0)
RDW: 13.7 % (ref 11.5–15.5)

## 2011-06-10 LAB — BASIC METABOLIC PANEL
BUN: 6 mg/dL (ref 6–23)
Calcium: 9.3 mg/dL (ref 8.4–10.5)
Creatinine, Ser: 0.72 mg/dL (ref 0.50–1.35)
GFR calc Af Amer: >90 mL/min (ref >90)
GFR calc non Af Amer: >90 mL/min (ref >90)
Glucose, Bld: 98 mg/dL (ref 70–99)

## 2011-06-10 SURGERY — EGD (ESOPHAGOGASTRODUODENOSCOPY)
Anesthesia: Moderate Sedation

## 2011-06-10 MED ORDER — MIDAZOLAM HCL 10 MG/2ML IJ SOLN
INTRAMUSCULAR | Status: DC | PRN
  Administered 2011-06-10 (×2): 1 mg via INTRAVENOUS
  Administered 2011-06-10 (×2): 2 mg via INTRAVENOUS

## 2011-06-10 MED ORDER — OMEPRAZOLE 40 MG PO CPDR: 40.0000 mg | DELAYED_RELEASE_CAPSULE | Freq: Two times a day (BID) | ORAL | Status: DC

## 2011-06-10 MED ORDER — SODIUM CHLORIDE 0.9 % IV SOLN
Freq: Once | INTRAVENOUS | Status: AC
  Administered 2011-06-10: 08:00:00 via INTRAVENOUS

## 2011-06-10 MED ORDER — DIPHENHYDRAMINE HCL 50 MG/ML IJ SOLN
INTRAMUSCULAR | Status: AC
  Filled 2011-06-10: qty 1

## 2011-06-10 MED ORDER — FENTANYL NICU IV SYRINGE 50 MCG/ML
INJECTION | INTRAMUSCULAR | Status: DC | PRN
  Administered 2011-06-10: 12.5 ug via INTRAVENOUS
  Administered 2011-06-10 (×2): 25 ug via INTRAVENOUS

## 2011-06-10 MED ORDER — DIPHENHYDRAMINE HCL 50 MG/ML IJ SOLN
INTRAMUSCULAR | Status: DC | PRN
  Administered 2011-06-10: 12.5 mg via INTRAVENOUS

## 2011-06-10 MED ORDER — SULFAMETHOXAZOLE-TRIMETHOPRIM 800-160 MG PO TABS: 1.0000 | ORAL_TABLET | Freq: Two times a day (BID) | ORAL | Status: AC

## 2011-06-10 MED ORDER — FENTANYL CITRATE 0.05 MG/ML IJ SOLN
INTRAMUSCULAR | Status: AC
  Filled 2011-06-10: qty 4

## 2011-06-10 MED ORDER — MIDAZOLAM HCL 10 MG/2ML IJ SOLN
INTRAMUSCULAR | Status: AC
  Filled 2011-06-10: qty 4

## 2011-06-10 NOTE — Op Note (Signed)
[facility] [address] [place], [place]  [address]  ENDOSCOPY PROCEDURE REPORT  PATIENT:  Harold Colon, Harold Colon  MR#:  [id number] BIRTHDATE:  Jul 13, 1955, 55 yrs. old  GENDER:  male  ENDOSCOPIST:  Charlott Rakes, MD Referred by:  Doralee Albino, M.D.  PROCEDURE DATE:  06/10/2011 PROCEDURE:  EGD, diagnostic 43235 ASA CLASS:  Class II INDICATIONS:  melena, hemorrhage of GI tract  MEDICATIONS:   Fentanyl 12.5 mcg IV, Versed 2 mg IV, There was residual sedation effect present from prior procedure.  TOPICAL ANESTHETIC:  DESCRIPTION OF PROCEDURE:   After the risks benefits and alternatives of the procedure were thoroughly explained, informed consent was obtained.  The Pentax Gastroscope [id number] endoscope was introduced through the mouth and advanced to the second portion of the duodenum, without limitations.  The instrument was slowly withdrawn as the mucosa was fully examined. <<PROCEDUREIMAGES>>  FINDINGS:  The endoscope was inserted into the oropharynx and esophagus was intubated.  The gastroesophageal junction was noted to be 42 cm from the incisors. Endoscope was advanced into the stomach which revealed normal-appearing gastric mucosa.  The endoscope was advanced into the duodenal bulb which revealed minimal edematous mucosa consistent with minimal duodenitis. No ulcers were noted. The second portion of duodenum was unremarkable.  The endoscope was withdrawn back into the stomach and retroflexion revealed a medium-sized hiatal hernia. Endoscope was straightened and withdrawn back into the gastroesophageal junction, which was normal in appearance. No bleeding or blood products were seen during the endoscopy.  COMPLICATIONS:  None  ENDOSCOPIC IMPRESSION:  1. Minimal duodenitis 2. Medium-sized hiatal hernia 3. No bleeding or blood products noted 4. Question mucosal bleed that occurred and has since resolved  RECOMMENDATIONS:    1. Avoid NSAIDs 2. PPI  daily (if heartburn symptoms develop) 3. Small portion, low fat meals  REPEAT EXAM:  N/A  ______________________________ Charlott Rakes, MD  CC:  Doralee Albino, MD  n. Rosalie DoctorCharlott Rakes at 06/10/2011 10:30 AM  Eduardo Osier, [phone]

## 2011-06-10 NOTE — Discharge Summary (Addendum)
Discharge Summary 06/10/2011 1:53 PM  Harold Colon DOB: 11/19/1955 MRN: [id number]  Date of Admission: 06/08/2011 Date of Discharge: 06/10/2011  PCP: No primary provider on file. Consultants: Dr. Bosie Clos in GI  Reason for Admission: GI bleed and cellulitis  Discharge Diagnosis Primary 1. GI bleed 2. Right arm MRSA cellulitis 3. Tobacco abuse  Hospital Course: 56 yo male with h/o MRSA p/w 5 day h/o rectal bleeding and 1 week h/o rash/cellulitis   1. GI Bleed: Patient reported some BRBPR followed by melena over the last week.  His hemoglobin was monitored and remained stable at 14.  Concern for ischemic cause given history of cocaine use; UDS was negative.  GI was consulted and performed EGD and colonoscopy given his age and symptoms.  Scopes showed mild duodenitis and internal/external hemorrhoids with no signs of active bleeding.  Recommend avoiding NSAIDs, continued PPI, and repeat colonoscopy in 10 years.  2. Right arm cellulitis: Patient with extensive history of MRSA infections.  He was started on vancomycin while in the hospital with improvement of the arm.  He remained afebrile and without a white count during this hospitalization.  Discharged on Bactrim DS 2 tabs BID.  3. Tobacco abuse: Placed on nicotine patch.   Discharge Exam: Temp:  [97 F (36.1 C)-98 F (36.7 C)] 97 F (36.1 C) (06/02 0822) Pulse Rate:  [78-89] 89  (06/02 0530) Resp:  [6-34] 30  (06/02 1027) BP: (55-192)/(37-179) 55/37 mmHg (06/02 1027) SpO2:  [91 %-100 %] 91 % (06/02 1027)  General: alert, cooperative, NAD CV: RRR, no murmurs Abd: +BS, soft, NTND Right arm: mild swelling of forearm, 1cm area of induration no fluctuance Ext: no edema, WWP  Procedures:  EGD  ENDOSCOPIC IMPRESSION:  1. Minimal duodenitis  2. Medium-sized hiatal hernia  3. No bleeding or blood products noted  4. Question mucosal bleed that occurred and has since  resolved  RECOMMENDATIONS:  1. Avoid NSAIDs  2. PPI daily  (if heartburn symptoms develop)  3. Small portion, low fat meals  Colonoscopy IMPRESSION:  1. Internal and external hemorrhoids as stated  above  2. No source of bleeding and no blood products noted  RECOMMENDATIONS:  1. Repeat colonoscopy for screening purposes in  10 years  2. Hemorrhoidal topical treatment as needed   Discharge Medications Medication List  As of 06/10/2011  1:53 PM   START taking these medications         omeprazole 40 MG capsule   Commonly known as: PRILOSEC   Take 1 capsule (40 mg total) by mouth 2 (two) times daily.      sulfamethoxazole-trimethoprim 800-160 MG per tablet   Commonly known as: BACTRIM DS,SEPTRA DS   Take 1 tablet by mouth 2 (two) times daily.          Where to get your medications    These are the prescriptions that you need to pick up.   You may get these medications from any pharmacy.         omeprazole 40 MG capsule   sulfamethoxazole-trimethoprim 800-160 MG per tablet            Pertinent Hospital Labs  Lab 06/10/11 0455 06/09/11 0515 06/08/11 2111  WBC 5.7 7.9 9.1  HGB 14.1 14.0 14.2  HCT 41.1 41.3 41.2  PLT 188 210 204    Lab 06/10/11 0455 06/09/11 0515 06/08/11 2111  NA 140 138 137  K 3.9 3.9 3.7  CL 104 101 99  CO2 26 25 28  BUN 6 10 12   CREATININE 0.72 0.74 0.87  LABGLOM -- -- --  GLUCOSE 98 -- --  CALCIUM 9.3 9.3 9.5    Discharge instructions: see AVS  Condition at discharge: stable  Disposition: home  Pending Tests: none  Follow up: Follow-up Information    [facility] with [facility] Call in 1 day. (for appointment THIS WEEK)    Contact information:   [address] [place] [address]          Follow up Issues:  1. Please assess for any further rectal bleeding 2. Please assess for continued improvement of right cellulitis 3. He will need a sleep study for CPAP initiation.  I have placed the order, please make sure he has scheduled the study.  BOOTH,  Kellsie Grindle 06/10/2011, 1:53 PM

## 2011-06-10 NOTE — Discharge Instructions (Signed)
Cellulitis Cellulitis is an infection of the tissue under the skin. The infected area is usually red and tender. This is caused by germs. These germs enter the body through cuts or sores. This usually happens in the arms or lower legs. HOME CARE   Take your medicine as told. Finish it even if you start to feel better.   If the infection is on the arm or leg, keep it raised (elevated).   Use a warm cloth on the infected area several times a day.   See your doctor for a follow-up visit as told.  GET HELP RIGHT AWAY IF:   You are tired or confused.   You throw up (vomit).   You have watery poop (diarrhea).   You feel ill and have muscle aches.   You have a fever.  MAKE SURE YOU:   Understand these instructions.   Will watch your condition.   Will get help right away if you are not doing well or get worse.  Document Released: 06/13/2007 Document Revised: 12/14/2010 Document Reviewed: 11/26/2008 [facility] Patient Information 2012 [place], [place].  Hemorrhoids Hemorrhoids are veins in the rectum that get big. These veins can get blocked. Blocked veins become puffy (swollen) and painful. HOME CARE  Eat more fiber.   Drink enough fluid to keep your pee (urine) clear or pale yellow.   Exercise often.   Avoid straining to poop (bowel movement).   Keep the butt area dry and clean.   Only take medicine as told by your doctor.  If your hemorrhoids are puffy and painful:  Take a warm bath for 20 to 30 minutes. Do this 3 to 4 times a day.   Place ice packs on the area. Use the ice packs between the baths.   Put ice in a plastic bag.   Place a towel between your skin and the bag.   Leave the ice on for 15 to 20 minutes, 3 to 4 times a day.   Do not use a donut-shaped pillow. Do not sit on the toilet for a long time.   Go to the bathroom when your body has the urge to poop. This is so you do not strain as much to poop.  GET HELP RIGHT AWAY IF:   You have increasing pain  that is not controlled with medicine.   You have uncontrolled bleeding.   You cannot poop.   You have pain or puffiness outside the area of the hemorrhoids.   You have chills.   You have a temperature by mouth above 102 F (38.9 C), not controlled by medicine.  MAKE SURE YOU:   Understand these instructions.   Will watch your condition.   Will get help right away if you are not doing well or get worse.  Document Released: 10/04/2007 Document Revised: 12/14/2010 Document Reviewed: 10/04/2007 [facility] Patient Information 2012 [place], [place].

## 2011-06-10 NOTE — Brief Op Note (Signed)
See endopro notes. No bleeding or blood products seen. EGD showed hiatal hernia and minimal duodenitis. Colonoscopy showed internal and external hemorrhoids. Advance diet. No further GI workup at this time. Will sign off. Call if questions. F/U with GI as needed.

## 2011-06-10 NOTE — Op Note (Signed)
[facility] [address] [place], [place]  [address]  COLONOSCOPY PROCEDURE REPORT  PATIENT:  Harold Colon, Harold Colon  MR#:  [id number] BIRTHDATE:  1955/09/04, 55 yrs. old  GENDER:  male ENDOSCOPIST:  Charlott Rakes, MD REF. BY:  Doralee Albino, M.D. PROCEDURE DATE:  06/10/2011 PROCEDURE:  Colonoscopy 19147 ASA CLASS:  Class II INDICATIONS:  Gastrointestinal hemorrhage, melena MEDICATIONS:   Fentanyl 50 mcg IV, Versed 4 mg IV, Benadryl 12.5 mg IV  DESCRIPTION OF PROCEDURE:   After the risks benefits and alternatives of the procedure were thoroughly explained, informed consent was obtained.  The Pentax Ped Colon [id number] and [id number] [id number]) endoscope was introduced through the anus and advanced to the cecum, which was identified by both the appendix and ileocecal valve, limited by looping difficulties, fair prep. The quality of the prep was fair but repeated irrigation led to a good and adequate prep.  The instrument was then slowly withdrawn as the colon was fully examined. <<PROCEDUREIMAGES>>  FINDINGS:  Rectal exam revealed medium-sized nonthrombosed external hemorrhoids.  Pediatric colonoscope inserted into the colon and advanced to the cecum, where the appendiceal orifice and ileocecal valve were identified.    In order to reach the cecum repeated loop reduction was necessary  and placement of the patient in the supine position with abdominal pressure was also necessary. On careful withdrawal of the colonoscope no mucosal abnormalities were noted. Repeated irrigation was necessary to achieve a good and adequate prep.    Retroflexion revealed small nonthrombosed internal hemorrhoids. No bleeding no blood products was seen in the entire colon. The terminal ileum could not be intubated due to excessive looping.  COMPLICATIONS:  None  IMPRESSION:     1. Internal and external hemorrhoids as stated above 2. No source of bleeding and no blood  products noted  RECOMMENDATIONS:   1. Repeat colonoscopy for screening purposes in 10 years 2. Hemorrhoidal topical treatment as needed  ______________________________ Charlott Rakes, MD  CC:  Doralee Albino, MD  n. Rosalie DoctorCharlott Rakes at 06/10/2011 10:15 AM  Eduardo Osier, [phone]

## 2011-06-10 NOTE — Interval H&P Note (Signed)
History and Physical Interval Note:  06/10/2011 9:20 AM  Harold Colon  has presented today for surgery, with the diagnosis of GIB  The various methods of treatment have been discussed with the patient and family. After consideration of risks, benefits and other options for treatment, the patient has consented to  Procedure(s) (LRB): ESOPHAGOGASTRODUODENOSCOPY (EGD) (N/A) COLONOSCOPY (N/A) as a surgical intervention .  The patients' history has been reviewed, patient examined, no change in status, stable for surgery.  I have reviewed the patients' chart and labs.  Questions were answered to the patient's satisfaction.     Kandis Henry C.

## 2011-06-10 NOTE — Discharge Summary (Signed)
Seen and examined.  Greatly appreciate GI help.  Results of colonoscopy and egd noted.  Cellulitis improving.  Discussed with Dr. Elwyn Reach and agree with DC today as outlined above.

## 2011-06-10 NOTE — Discharge Summary (Signed)
Seen and examined.  Agree with DC today as outlined by Dr. Elwyn Reach.  Greatly appreciate GI help.

## 2011-06-10 NOTE — H&P (View-Only) (Signed)
Subjective: Patient states arm still hurts but is feeling a little better.    Objective: Vital signs in last 24 hours: Temp:  [97.7 F (36.5 C)-99.3 F (37.4 C)] 98.4 F (36.9 C) (06/01 0600) Pulse Rate:  [79-116] 84  (06/01 0600) Resp:  [16-28] 16  (06/01 0600) BP: (115-143)/(80-92) 115/80 mmHg (06/01 0600) SpO2:  [93 %-98 %] 97 % (06/01 0600) Weight:  [124.5 kg (274 lb 7.6 oz)] 124.5 kg (274 lb 7.6 oz) (06/01 0310) Weight change:  Last BM Date: 06/08/11  Intake/Output from previous day:   Intake/Output this shift:    Constitutional: He appears well-developed and well-nourished. No distress.  Cardiovascular: Regular rhythm, normal heart sounds and intact distal pulses No murmur heard.  Respiratory: Effort normal. No accessory muscle usage. No respiratory distress.  GI: Soft. Bowel sounds are normal. He exhibits distension. He exhibits no ascites. There is no tenderness. There is no rebound and no guarding.  Guaiac positive stool.  >20 lesions of various stages over suprapubic, pubic, and groin areas- some papules, some healing lesion, no drainage or erythema, no fluctuance, no inguinal LAD  Skin: Skin is warm and dry. Right arm still warm to touch, mild erythema, per H&P note, seems to be improving.    Lab Results:  Basename 06/09/11 0515 06/08/11 2111  WBC 7.9 9.1  HGB 14.0 14.2  HCT 41.3 41.2  PLT 210 204   BMET  Basename 06/09/11 0515 06/08/11 2111  NA 138 137  K 3.9 3.7  CL 101 99  CO2 25 28  GLUCOSE 97 126*  BUN 10 12  CREATININE 0.74 0.87  CALCIUM 9.3 9.5    Studies/Results: Ct Abdomen Pelvis W Contrast  06/09/2011  *RADIOLOGY REPORT*  Clinical Data: Abdominal distension.  CT ABDOMEN AND PELVIS WITH CONTRAST  Technique:  Multidetector CT imaging of the abdomen and pelvis was performed following the standard protocol during bolus administration of intravenous contrast.  Contrast:  100 ml Omnipaque 300  Comparison: None.  Findings: The mild dependent changes  in the lung bases.  The gallbladder is contracted, likely due to nonfasting state.  The liver, spleen, pancreas, adrenal glands, and retroperitoneal lymph nodes are unremarkable.  Calcification of the abdominal aorta without aneurysm.  Small sub centimeter parenchymal cysts in the kidneys.  No evidence of solid mass or hydronephrosis.  The stomach, small bowel, and colon are not distended.  The colon is diffusely stool filled without apparent wall thickening.  No free air or free fluid in the abdomen.  Pelvis:  The prostate gland is not enlarged.  No bladder wall thickening.  Small lipoma in the inferior right rectus abdominous muscle.  No free fluid or free air in the pelvis.  The appendix is normal.  No evidence of diverticulitis.  Normal alignment of the lumbar vertebrae with degenerative changes present.  IMPRESSION: No acute process demonstrated in the abdomen or pelvis.  Original Report Authenticated By: WILLIAM R. STEVENS, M.D.    Medications: I have reviewed the patient's current medications.  Assessment/Plan:  55 yo male with h/o MRSA p/w 5 day h/o rectal bleeding and 1 week h/o rash/cellulitis   1. GI bleed: nonbleeding external hemorrhoids, hemoccult +, denies EtOH use, has never had colonoscopy, does have chronic NSAID use  -GI consulted will see patient in next 2 days. Dr, Schooler of [facility] paged.  -No abdominal pain or clear source of bleed now with more melena though so more concern for upper bleed.  -Hemoglobin stable, 14.2-->14.0 (unchanged from   1 year ago); INR normal (1)  -NPO still until Gi sees patient will advance accordingly.  -Protonix IV  -CT abd due to distension on exam was negative  2. Cellulitis/rash: pt with h/o MRSA  -on vanc pharm following.   -groin area appears to be folliculitis and chronic in nature  -Vancomycin while NPO, then will change to doxycycyline   3. Tachycardia: improved likley was from some dehydration -pt denies current cocaine use, UDS  pending   -IVF for possible dehydration, although Cr normal and pt does not appear clinically dry   4. Tobacco use: 1 ppd  -nicotine patch  FEN/GI: NPO (GI bleed), NS @ 100cc/hr  Ppx: SCDs (GI bleed), protonix  Dispo:pending clinical improvement and further work up.    LOS: 1 day   Shisler,Leighann Amadon 06/09/2011, 9:28 AM   

## 2011-06-12 ENCOUNTER — Emergency Department (INDEPENDENT_AMBULATORY_CARE_PROVIDER_SITE_OTHER)
Admission: EM | Admit: 2011-06-12 | Discharge: 2011-06-12 | Disposition: A | Discharge status: Home / Self Care | Payer: Self-pay | Source: Home / Self Care | Attending: Emergency Medicine | Admitting: Emergency Medicine

## 2011-06-12 ENCOUNTER — Encounter (HOSPITAL_COMMUNITY): Payer: Self-pay | Admitting: Gastroenterology

## 2011-06-12 DIAGNOSIS — L03119 Cellulitis of unspecified part of limb: Secondary | ICD-10-CM

## 2011-06-12 DIAGNOSIS — IMO0002 Reserved for concepts with insufficient information to code with codable children: Secondary | ICD-10-CM

## 2011-06-12 NOTE — Discharge Instructions (Signed)
   Continue with your antibiotics and followup with family practice in 2 days we are calling them to make sure they facilitate a provider to see you as you were discharged from their service.  Cellulitis Cellulitis is an infection of the tissue under the skin. The infected area is usually red and tender. This is caused by germs. These germs enter the body through cuts or sores. This usually happens in the arms or lower legs. HOME CARE   Take your medicine as told. Finish it even if you start to feel better.   If the infection is on the arm or leg, keep it raised (elevated).   Use a warm cloth on the infected area several times a day.   See your doctor for a follow-up visit as told.  GET HELP RIGHT AWAY IF:   You are tired or confused.   You throw up (vomit).   You have watery poop (diarrhea).   You feel ill and have muscle aches.   You have a fever.  MAKE SURE YOU:   Understand these instructions.   Will watch your condition.   Will get help right away if you are not doing well or get worse.  Document Released: 06/13/2007 Document Revised: 12/14/2010 Document Reviewed: 11/26/2008 [facility] Patient Information 2012 [place], [place].

## 2011-06-12 NOTE — ED Notes (Signed)
Pt seen in  [facility] 5/31 for right arm wound. Told to follow up here for wound check. Right arm remains swollen.

## 2011-06-12 NOTE — Progress Notes (Signed)
Utilization Review Completed.Harold Colon T6/04/2011   

## 2011-06-12 NOTE — ED Provider Notes (Signed)
History     CSN: [id number]  Arrival date & time 06/12/11  1305   First MD Initiated Contact with Patient 06/12/11 1316      Chief Complaint  Patient presents with  . Wound Check    (Consider location/radiation/quality/duration/timing/severity/associated sxs/prior treatment) HPI Comments: Patient was told to followup with family practice he today got somewhat confused and thought that it he needed to come here. He describes that he's been taking his antibiotics and that the swelling is still there but the redness and tenderness have improved. Patient also denies any fevers, chills or generalized malaise or body aches. " I think I'm doing better swelling is a bit better"  Patient is a 56 y.o. male presenting with wound check. The history is provided by the patient.  Wound Check  Previous treatment in the ED includes oral antibiotics. Treatments since wound repair include oral antibiotics. There has been no drainage from the wound. The redness has improved. The swelling has improved. The pain has improved. He has no difficulty moving the affected extremity or digit.    Past Medical History  Diagnosis Date  . Chest pain   . Cocaine abuse last use 2010  . Herpes   . MRSA (methicillin resistant Staphylococcus aureus)   . OSA (obstructive sleep apnea)     Past Surgical History  Procedure Date  . Hand surgery   . Esophagogastroduodenoscopy 06/10/2011    Procedure: ESOPHAGOGASTRODUODENOSCOPY (EGD);  Surgeon: Shirley Friar, MD;  Location: [facility] ENDOSCOPY;  Service: Endoscopy;  Laterality: N/A;  . Colonoscopy 06/10/2011    Procedure: COLONOSCOPY;  Surgeon: Shirley Friar, MD;  Location: [facility] ENDOSCOPY;  Service: Endoscopy;  Laterality: N/A;    Family History  Problem Relation Age of Onset  . Diabetes      History  Substance Use Topics  . Smoking status: Former Smoker    Types: Cigarettes  . Smokeless tobacco: Not on file  . Alcohol Use: No      Review of Systems    Constitutional: Negative for fever, chills, activity change and fatigue.  Musculoskeletal: Negative for myalgias and joint swelling.  Skin: Negative for rash and wound.    Allergies  Review of patient's allergies indicates no known allergies.  Home Medications   Current Outpatient Rx  Name Route Sig Dispense Refill  . OMEPRAZOLE 40 MG PO CPDR Oral Take 1 capsule (40 mg total) by mouth 2 (two) times daily. 60 capsule 0  . SULFAMETHOXAZOLE-TRIMETHOPRIM 800-160 MG PO TABS Oral Take 1 tablet by mouth 2 (two) times daily. 40 tablet 0    BP 119/81  Pulse 96  Temp(Src) 98.4 F (36.9 C) (Oral)  Resp 18  SpO2 99%  Physical Exam  Nursing note and vitals reviewed. Constitutional: He appears well-developed and well-nourished.  Skin: There is erythema.       ED Course  Procedures (including critical care time)  Labs Reviewed - No data to display No results found.   1. Cellulitis of arm       MDM  Patient recently discharged from hospitalization for extensive right forearm cellulitis. Patient reports is taking antibiotics and denies any systemic symptoms he describes that his swelling and redness have improved. Patient will followup with family practice in 2 days. Patient looks comfortable and I explained clearly what symptoms should merit him to go immediately to the emergency department. He acknowledges his understanding. He feels he is doing better        Jimmie Molly, MD 06/12/11 2042

## 2011-06-12 NOTE — ED Notes (Signed)
Pt instructed to expect a call from [facility]  RN for appointment with [facility]

## 2011-06-13 NOTE — ED Notes (Signed)
6/4 I called [facility] and left a message that pt. needs a follow-up appointment in 2 days.  He was supposed to see them after he was discharged from the hospital but came here instead. Dr. Ladon Applebaum wants pt. to be seen there. They called back and will call the pt. to schedule the appointment.  [facility] left a VM saying appt. was scheduled for 06/14/11 with Dr. Gwendolyn Grant.  Dr. Ladon Applebaum notified. Vassie Moselle 06/13/2011

## 2011-06-14 ENCOUNTER — Ambulatory Visit (INDEPENDENT_AMBULATORY_CARE_PROVIDER_SITE_OTHER): Payer: Self-pay | Admitting: Family Medicine

## 2011-06-14 ENCOUNTER — Encounter: Payer: Self-pay | Admitting: Family Medicine

## 2011-06-14 VITALS — BP 115/81 | HR 96 | Temp 98.5°F | Ht 73.0 in | Wt 273.5 lb

## 2011-06-14 DIAGNOSIS — L0390 Cellulitis, unspecified: Secondary | ICD-10-CM

## 2011-06-14 DIAGNOSIS — G4730 Sleep apnea, unspecified: Secondary | ICD-10-CM

## 2011-06-14 DIAGNOSIS — R0989 Other specified symptoms and signs involving the circulatory and respiratory systems: Secondary | ICD-10-CM

## 2011-06-14 DIAGNOSIS — R0683 Snoring: Secondary | ICD-10-CM

## 2011-06-14 DIAGNOSIS — K922 Gastrointestinal hemorrhage, unspecified: Secondary | ICD-10-CM

## 2011-06-14 NOTE — Patient Instructions (Signed)
Come back and see me next Friday so we can make sure you're arm's getting better. If you notice any red streaks, heat from your arm, increased swelling, or fevers don't wait until Friday, come back sooner.    It was good to meet you today.

## 2011-06-14 NOTE — Progress Notes (Signed)
  Subjective:    Patient ID: Harold Colon, male    DOB: 1955-09-05, 56 y.o.   MRN: [id number]  HPI 56 yo Male recently diagnosed with Right forearm cellulitis requiring admission to the hospital and IV antibiotics.  Recently discharged and still on antibiotics.  Doing well since discharge, still taking Antibiotics.    1.  FU for arm cellulitis:  Much improved.  Still on Antibiotics.  Notes some mild redness, but much better.  No pain.  No fevers, chills, nausea, vomiting.   2.  Blood in stool:  Had episodes of BRBPR prior to hospital admission.  Colonoscopy performed in hospital which showed external and internal hemorrhoids.  No further episodes of bleeding since leaving the hospital.  No orthostasis  3.  Sleep study:  Patient diagnosed with probably OSA while in house.  Recommended for sleep study while in house.  States he wakes multiple times snoring at night and awakens tired almost everyday.  Would like treatment for this.    The following portions of the patient's history were reviewed and updated as appropriate: allergies, current medications, past medical history, family and social history, and problem list.  Patient is a nonsmoker  Review of Systems See HPI above for review of systems.       Objective:   Physical Exam Gen:  Alert, cooperative patient who appears stated age in no acute distress.  Vital signs reviewed. HEENT:  [place]/AT, MMM Cardiac:  Regular rate and rhythm without murmur auscultated.  Good S1/S2. Pulm:  Clear to auscultation bilaterally with good air movement.  No wheezes or rales noted.   Abd:  Soft/nondistended/nontender.  Good bowel sounds throughout all four quadrants.  No masses noted.  Skin:  1 cm area in diameter of erythema noted superior aspect of Right forearm.  Minimal heat, no fluctuance or induration noted.         Assessment & Plan:

## 2011-06-15 ENCOUNTER — Encounter: Payer: Self-pay | Admitting: Family Medicine

## 2011-06-15 DIAGNOSIS — G4733 Obstructive sleep apnea (adult) (pediatric): Secondary | ICD-10-CM | POA: Insufficient documentation

## 2011-06-15 NOTE — Assessment & Plan Note (Signed)
Resolved.   Gave him red flags and warnings to return if this recurs.

## 2011-06-15 NOTE — Assessment & Plan Note (Signed)
Based on prior notes, this is improving. Plan to continue antibiotics for full course. Want to see him after he finishes Antibiotics to ensure that his cellulitis has totally resolved.

## 2011-06-15 NOTE — Assessment & Plan Note (Signed)
Plan to refer for sleep study based on history of symptoms.

## 2011-06-22 ENCOUNTER — Ambulatory Visit: Payer: Self-pay | Admitting: Family Medicine

## 2011-06-27 ENCOUNTER — Ambulatory Visit: Payer: Self-pay | Admitting: Family Medicine

## 2011-07-02 ENCOUNTER — Ambulatory Visit (HOSPITAL_BASED_OUTPATIENT_CLINIC_OR_DEPARTMENT_OTHER): Payer: Self-pay | Attending: Family Medicine | Admitting: Radiology

## 2011-07-02 VITALS — Ht 73.0 in | Wt 270.0 lb

## 2011-07-02 DIAGNOSIS — Z9989 Dependence on other enabling machines and devices: Secondary | ICD-10-CM

## 2011-07-02 DIAGNOSIS — G4730 Sleep apnea, unspecified: Secondary | ICD-10-CM

## 2011-07-02 DIAGNOSIS — G4733 Obstructive sleep apnea (adult) (pediatric): Secondary | ICD-10-CM | POA: Insufficient documentation

## 2011-07-02 DIAGNOSIS — R0989 Other specified symptoms and signs involving the circulatory and respiratory systems: Secondary | ICD-10-CM | POA: Insufficient documentation

## 2011-07-02 DIAGNOSIS — R0609 Other forms of dyspnea: Secondary | ICD-10-CM | POA: Insufficient documentation

## 2011-07-07 DIAGNOSIS — R0989 Other specified symptoms and signs involving the circulatory and respiratory systems: Secondary | ICD-10-CM

## 2011-07-07 DIAGNOSIS — G4733 Obstructive sleep apnea (adult) (pediatric): Secondary | ICD-10-CM

## 2011-07-07 DIAGNOSIS — R0609 Other forms of dyspnea: Secondary | ICD-10-CM

## 2011-07-07 NOTE — Procedures (Signed)
NAME:  Harold Colon, Harold Colon               ACCOUNT NO.:  [id number]  MEDICAL RECORD NO.:  [id number]          PATIENT TYPE:  OUT  LOCATION:  SLEEP CENTER                 FACILITY:  [facility]  PHYSICIAN:  Kismet Facemire D. Maple Hudson, MD, FCCP, FACPDATE OF BIRTH:  1956-01-08  DATE OF STUDY:  07/02/2011                           NOCTURNAL POLYSOMNOGRAM  REFERRING PHYSICIAN:  Sarah Swaziland, MD  INDICATION FOR STUDY:  Hypersomnia with sleep apnea.  EPWORTH SLEEPINESS SCORE:  14/24.  BMI 35.6.  Weight 270 pounds, height 73 inches, neck 18.5 inches.  MEDICATIONS:  Home medications are charted as "none."  SLEEP ARCHITECTURE:  Split study protocol.  During the diagnostic phase, total sleep time 131.5 minutes with sleep efficiency 83%.  Stage I was 36.9%, stage II 45.6%, stage III absent, REM 17.5% of total sleep time. Sleep latency 2.5 minutes, REM latency 63 minutes.  Awake after sleep onset 23.5 minutes.  Arousal index of 73.5.  BEDTIME MEDICATION:  None.  RESPIRATORY DATA:  Apnea-hypopnea index (AHI) 68 per hour.  A total of 149 events was scored including 108 obstructive apneas, 2 central apneas, 2 mixed apneas, 37 hypopneas.  Events were scored as supine. REM AHI 60 per hour.  CPAP was titrated to 20 CWP, AHI 0 per hour.  With continued snoring, he was switched to Bi-Flex (BiPAP) at 1:06 a.m. and titrated to a final bilevel pressure, inspiratory 24 CWP, and expiratory 18 CWP for final AHI of 4.1 per hour.  Sleep quality was visibly improved with sustained REM and resolution of frequent wakings.  He wore a large ResMed Mirage Quattro full-face mask with heated humidifier and a Bi-Flex setting of 3.  OXYGEN DATA:  Before CPAP/BIPAP, snoring was loud with oxygen desaturation to a nadir of 25%.  Supplemental oxygen was added at 1 liter/minute.  It was turned off for trial, but CPAP was insufficient to maintain oxygenation, so finally supplemental oxygen was maintained at 2 liters/minute for the rest of the  study.  Snoring was prevented by final BiPAP settings with mean oxygen saturation through the titration phase recorded at 84.1%.  This included time with oxygen and time without.  CARDIAC DATA:  Sinus rhythm with occasional PVC.  MOVEMENT-PARASOMNIA:  No significant movement disturbance.  No bathroom trips.  IMPRESSIONS-RECOMMENDATIONS: 1. Severe obstructive sleep apnea/hypopnea syndrome, AHI of 68 per     hour with supine events, loud snoring and oxygen desaturation to a     nadir of 25% on room air. 2. Successful positive airway pressure titration at a maximum     continuous positive airway pressure setting of 20 CWP, obstructive     events were prevented, but significant snoring persisted.  He was     switched to bilevel (BiPAP) and titrated to a final pressure of     inspiratory 24 CWP, expiratory 18 CWP.  This is the recommended     initial pressure setting for home trial.  This was associated with     AHI of 4.1 per hour.  Snoring was prevented.  He wore a large     ResMed Mirage Quattro full-face mask with heated humidifier and a     Bi-Flex setting of 3. 3.  On arrival, baseline room air oxygen saturation was recorded as     75%.  During the initial diagnostic recording, he desaturated to a     nadir of 25% on room air.  Supplemental oxygen was provided by     protocol up to 2 liters/minute.  With CPAP in place, oxygen was     discontinued briefly, but was still required and the rest of the     study was completed with supplemental oxygen at 2 liters.  Mean     oxygen saturation during the titration phase, including some time     on room air, was 84.1%.  Recommend initiation of home bilevel     positive airway pressure and then overnight oximetry to evaluate     need for addition of supplemental home oxygen through bilevel     positive airway pressure in the home environment.  If the initial     room air oxygen saturation of 75% is correct, then there is     significant  underlying cardiopulmonary disease, which also needs     evaluation.     Coen Miyasato D. Maple Hudson, MD, [facility], FACP Diplomate, American Board of Sleep Medicine    CDY/MEDQ  D:  07/07/2011 09:20:56  T:  07/07/2011 11:34:43  Job:  [id number]

## 2011-10-30 ENCOUNTER — Encounter (HOSPITAL_COMMUNITY): Payer: Self-pay | Admitting: *Deleted

## 2011-10-30 ENCOUNTER — Emergency Department (INDEPENDENT_AMBULATORY_CARE_PROVIDER_SITE_OTHER)
Admission: EM | Admit: 2011-10-30 | Discharge: 2011-10-30 | Disposition: A | Discharge status: Home / Self Care | Payer: Self-pay | Source: Home / Self Care

## 2011-10-30 DIAGNOSIS — L0291 Cutaneous abscess, unspecified: Secondary | ICD-10-CM

## 2011-10-30 DIAGNOSIS — L0390 Cellulitis, unspecified: Secondary | ICD-10-CM

## 2011-10-30 MED ORDER — SULFAMETHOXAZOLE-TRIMETHOPRIM 800-160 MG PO TABS: 1.0000 | ORAL_TABLET | Freq: Two times a day (BID) | ORAL | Status: DC

## 2011-10-30 NOTE — ED Provider Notes (Addendum)
History     CSN: [id number]  Arrival date & time 10/30/11  0920   None     Chief Complaint  Patient presents with  . Rash    (Consider location/radiation/quality/duration/timing/severity/associated sxs/prior treatment) HPI Comments: 56 year old male with a history of herpes, MRSA, diabetes and cocaine abuse presents with multiple pustules about his trunk. This is not the first time he's had this. In the past has been treated with antibiotics the name of which he does not know. He denies systemic symptoms such as fever chills or malaise. There are several small raised annular areas ranging from 3 mm in diameter to 8 mm in diameter. These are too numerous to count There is a larger nodular type lesion in the left axilla. Does not feel fluctuant.   Past Medical History  Diagnosis Date  . Chest pain   . Cocaine abuse last use 2010  . Herpes   . MRSA (methicillin resistant Staphylococcus aureus)   . OSA (obstructive sleep apnea)     Past Surgical History  Procedure Date  . Hand surgery   . Esophagogastroduodenoscopy 06/10/2011    Procedure: ESOPHAGOGASTRODUODENOSCOPY (EGD);  Surgeon: Shirley Friar, MD;  Location: [facility] ENDOSCOPY;  Service: Endoscopy;  Laterality: N/A;  . Colonoscopy 06/10/2011    Procedure: COLONOSCOPY;  Surgeon: Shirley Friar, MD;  Location: [facility] ENDOSCOPY;  Service: Endoscopy;  Laterality: N/A;    Family History  Problem Relation Age of Onset  . Diabetes      History  Substance Use Topics  . Smoking status: Former Smoker    Types: Cigarettes  . Smokeless tobacco: Not on file  . Alcohol Use: No      Review of Systems  Constitutional: Negative.   Respiratory: Negative.   Gastrointestinal: Negative.   Genitourinary: Negative.   Skin:       As per history of present illness  Neurological: Negative.     Allergies  Review of patient's allergies indicates no known allergies.  Home Medications   Current Outpatient Rx  Name Route Sig  Dispense Refill  . OMEPRAZOLE 40 MG PO CPDR Oral Take 1 capsule (40 mg total) by mouth 2 (two) times daily. 60 capsule 0  . SULFAMETHOXAZOLE-TRIMETHOPRIM 800-160 MG PO TABS Oral Take 1 tablet by mouth every 12 (twelve) hours. 16 tablet 0    BP 131/84  Pulse 69  Temp 98 F (36.7 C) (Oral)  Resp 20  SpO2 100%  Physical Exam  Constitutional: He is oriented to person, place, and time. He appears well-developed. No distress.  HENT:  Head: Normocephalic and atraumatic.  Eyes: Conjunctivae normal and EOM are normal.  Neck: Normal range of motion. Neck supple.  Cardiovascular: Normal rate, regular rhythm and normal heart sounds.   Pulmonary/Chest: Effort normal and breath sounds normal. No respiratory distress. He has no wheezes.  Musculoskeletal: Normal range of motion. He exhibits no edema and no tenderness.  Neurological: He is alert and oriented to person, place, and time. No cranial nerve deficit.  Skin: Skin is warm and dry.       Multiple pustules in raised red tender or annular and unemployed lesions to the anterior posterior lateral trunk.  Psychiatric: He has a normal mood and affect.    ED Course  Procedures (including critical care time)   Labs Reviewed  CULTURE, ROUTINE-ABSCESS   No results found.   1. Cellulitis and abscess       MDM  I have obtained a culture from a couple of small  pustules Treat with Septra DS twice a day empirically. He does have hx of MRSA. Instructions for MRSA and MRSA hygiene.  No results found. Results for orders placed during the hospital encounter of 10/30/11  CULTURE, ROUTINE-ABSCESS      Component Value Range   Specimen Description ABSCESS CHEST LEFT     Special Requests FROM PUSTULE OF LEFT ANTERIOR CHEST     Gram Stain       Value: RARE WBC PRESENT, PREDOMINANTLY PMN     RARE SQUAMOUS EPITHELIAL CELLS PRESENT     RARE GRAM POSITIVE COCCI IN PAIRS   Culture       Value: MODERATE METHICILLIN RESISTANT STAPHYLOCOCCUS AUREUS       Note: RIFAMPIN AND GENTAMICIN SHOULD NOT BE USED AS SINGLE DRUGS FOR TREATMENT OF STAPH INFECTIONS. This organism DOES NOT demonstrate inducible Clindamycin resistance in vitro. CRITICAL RESULT CALLED TO, READ BACK BY AND VERIFIED WITH: OLIVIA SNEED      @1212  ON 102413 BY [facility]   Report Status 11/01/2011 FINAL     Organism ID, Bacteria METHICILLIN RESISTANT STAPHYLOCOCCUS AUREUS            Hayden Rasmussen, NP 10/30/11 1137  Hayden Rasmussen, NP 11/02/11 2137

## 2011-10-30 NOTE — ED Notes (Signed)
Pt  Reports   Symptoms   Of  A  Skin  Rash    With  Small  Yellow     Pustules         Present        Pt   Reports       He  Was  Seen  For  Similar  Symptoms      sev  Months  Ago  Was  Placed  On  Anti biotics            At that  Time       He  Is  In no  Acute  Distress      At this  Time  He  Displays  No  Angioedema     Or  Any  resp  Difficulty

## 2011-10-31 NOTE — ED Provider Notes (Signed)
Medical screening examination/treatment/procedure(s) were performed by resident physician or non-physician practitioner and as supervising physician I was immediately available for consultation/collaboration.   Erendida Wrenn DOUGLAS MD.    Jayleigh Notarianni D Slater Mcmanaman, MD 10/31/11 2105 

## 2011-11-01 ENCOUNTER — Telehealth (HOSPITAL_COMMUNITY): Payer: Self-pay | Admitting: *Deleted

## 2011-11-01 LAB — CULTURE, ROUTINE-ABSCESS

## 2011-11-01 NOTE — ED Notes (Signed)
Abscess culture chest: mod. MRSA.  Pt. adequately treated with Septra DS.  I called and left a message to call.

## 2011-11-02 ENCOUNTER — Telehealth (HOSPITAL_COMMUNITY): Payer: Self-pay | Admitting: *Deleted

## 2011-11-05 NOTE — ED Provider Notes (Signed)
Medical screening examination/treatment/procedure(s) were performed by non-physician practitioner and as supervising physician I was immediately available for consultation/collaboration.  Christophor Eick, M.D.   Konner Saiz C Ehren Berisha, MD 11/05/11 0800 

## 2011-11-06 ENCOUNTER — Telehealth (HOSPITAL_COMMUNITY): Payer: Self-pay | Admitting: *Deleted

## 2011-11-06 NOTE — ED Notes (Signed)
Left message with contact - wife of Dmitri Tso ( brother).  She said she would try to get a message to him to call.

## 2011-11-07 ENCOUNTER — Telehealth (HOSPITAL_COMMUNITY): Payer: Self-pay | Admitting: *Deleted

## 2011-11-07 NOTE — ED Notes (Signed)
Unable to reach pt. by phone.  Letter sent with results and MRSA instructions from the [facility] and [facility] handout.  Instructed to call if any questions. Vassie Moselle 11/07/2011

## 2011-12-04 ENCOUNTER — Encounter (HOSPITAL_COMMUNITY): Payer: Self-pay | Admitting: Emergency Medicine

## 2011-12-04 ENCOUNTER — Emergency Department (INDEPENDENT_AMBULATORY_CARE_PROVIDER_SITE_OTHER)
Admission: EM | Admit: 2011-12-04 | Discharge: 2011-12-04 | Disposition: A | Discharge status: Home / Self Care | Payer: Self-pay | Source: Home / Self Care | Attending: Emergency Medicine | Admitting: Emergency Medicine

## 2011-12-04 DIAGNOSIS — L0292 Furuncle, unspecified: Secondary | ICD-10-CM

## 2011-12-04 DIAGNOSIS — L02223 Furuncle of chest wall: Secondary | ICD-10-CM

## 2011-12-04 DIAGNOSIS — L0293 Carbuncle, unspecified: Secondary | ICD-10-CM

## 2011-12-04 MED ORDER — DOXYCYCLINE HYCLATE 100 MG PO CAPS: 100.0000 mg | ORAL_CAPSULE | Freq: Two times a day (BID) | ORAL | Status: DC

## 2011-12-04 MED ORDER — CHLORHEXIDINE GLUCONATE 4 % EX LIQD: 60.0000 mL | CUTANEOUS | Status: DC

## 2011-12-04 NOTE — ED Provider Notes (Addendum)
History     CSN: [id number]  Arrival date & time 12/04/11  5621   First MD Initiated Contact with Patient 12/04/11 [phone]      Chief Complaint  Patient presents with  . Follow-up    (Consider location/radiation/quality/duration/timing/severity/associated sxs/prior treatment) HPI Comments: Patient presents urgent care this morning complaining of newly developed infections on his upper back and trunk area. Has 4 distinctive areas of redness tenderness that he describes as newly developing and within the last 2-3 days. Patient denies any fevers or chills or generalized malaise or body aches. Describes that he did very well after the first antibiotic course but he's noticing lately that he continues experiencing this infections. He does describe that he leaves in a community home where there is more people and he believes he gets infected by others.  The history is provided by the patient.    Past Medical History  Diagnosis Date  . Chest pain   . Cocaine abuse last use 2010  . Herpes   . MRSA (methicillin resistant Staphylococcus aureus)   . OSA (obstructive sleep apnea)     Past Surgical History  Procedure Laterality Date  . Hand surgery    . Esophagogastroduodenoscopy  06/10/2011    Procedure: ESOPHAGOGASTRODUODENOSCOPY (EGD);  Surgeon: Shirley Friar, MD;  Location: [facility] ENDOSCOPY;  Service: Endoscopy;  Laterality: N/A;  . Colonoscopy  06/10/2011    Procedure: COLONOSCOPY;  Surgeon: Shirley Friar, MD;  Location: [facility] ENDOSCOPY;  Service: Endoscopy;  Laterality: N/A;    Family History  Problem Relation Age of Onset  . Diabetes      History  Substance Use Topics  . Smoking status: Former Smoker    Types: Cigarettes  . Smokeless tobacco: Not on file  . Alcohol Use: No      Review of Systems  Constitutional: Negative for fever and chills.  Skin: Positive for color change and rash.    Allergies  Review of patient's allergies indicates no known allergies.  Home  Medications   Current Outpatient Rx  Name  Route  Sig  Dispense  Refill  . chlorhexidine (HIBICLENS) 4 % external liquid   Topical   Apply 60 mLs (4 application total) topically 2 (two) times a week.   473 mL   0   . doxycycline (VIBRAMYCIN) 100 MG capsule   Oral   Take 1 capsule (100 mg total) by mouth 2 (two) times daily.   20 capsule   0   . omeprazole (PRILOSEC) 40 MG capsule   Oral   Take 1 capsule (40 mg total) by mouth 2 (two) times daily.   60 capsule   0   . sulfamethoxazole-trimethoprim (SEPTRA DS) 800-160 MG per tablet   Oral   Take 1 tablet by mouth every 12 (twelve) hours.   16 tablet   0     BP 141/90  Pulse 82  Temp(Src) 99 F (37.2 C) (Oral)  Resp 20  SpO2 99%  Physical Exam  Vitals reviewed. Constitutional: Vital signs are normal. He appears well-developed and well-nourished.  Non-toxic appearance. He does not have a sickly appearance. He does not appear ill. No distress.  Neurological: He is alert.  Skin: No rash noted. There is erythema.       ED Course  Procedures (including critical care time)  Labs Reviewed - No data to display No results found.   1. Furuncle of chest wall   2. Recurrent carbunculosis       MDM  Recurrent soft tissue infections in the form of  carbuncules and abscesses. Patient lives in a community home and denies that he is diabetic. Today was prescribed a course of doxycycline for 10 days along with preventive empirical use of a hybrid cleanse twice a week in affected areas.        Jimmie Molly, MD 12/04/11 4010  Jimmie Molly, MD 03/30/12 2725  Jimmie Molly, MD 03/30/12 [phone]

## 2011-12-04 NOTE — ED Notes (Signed)
Pt is here for a f/u for MRSA... Was seen on 10/30/11 and treated for MRSA... Pt states he feels much better but feels like sx are coming back and reports small painful pustules under bilateral axillary... Denies: fevers, vomiting, nauseas, diarrhea... Pt is alert w/no signs of distress.... Has finished course of antibiotics.

## 2012-03-30 ENCOUNTER — Emergency Department (INDEPENDENT_AMBULATORY_CARE_PROVIDER_SITE_OTHER)
Admission: EM | Admit: 2012-03-30 | Discharge: 2012-03-30 | Disposition: A | Discharge status: Home / Self Care | Payer: Self-pay | Source: Home / Self Care | Attending: Family Medicine | Admitting: Family Medicine

## 2012-03-30 ENCOUNTER — Encounter (HOSPITAL_COMMUNITY): Payer: Self-pay | Admitting: Emergency Medicine

## 2012-03-30 MED ORDER — SULFAMETHOXAZOLE-TRIMETHOPRIM 800-160 MG PO TABS: 1.0000 | ORAL_TABLET | Freq: Two times a day (BID) | ORAL | Status: DC

## 2012-03-30 MED ORDER — SULFAMETHOXAZOLE-TRIMETHOPRIM 800-160 MG PO TABS: 1.0000 | ORAL_TABLET | Freq: Two times a day (BID) | ORAL | Status: AC

## 2012-03-30 MED ORDER — CHLORHEXIDINE GLUCONATE 4 % EX LIQD: 60.0000 mL | CUTANEOUS | Status: AC

## 2012-03-30 NOTE — ED Provider Notes (Signed)
History     CSN: [id number]  Arrival date & time 03/30/12  1530   First MD Initiated Contact with Patient 03/30/12 1536      Chief Complaint  Patient presents with  . Recurrent Skin Infections    axillary and abdomen    (Consider location/radiation/quality/duration/timing/severity/associated sxs/prior treatment) HPI Comments: Pt states that he was recently treated for mrsa and has finished antibiotics three weeks ago but has since had a new area appear on stomach that started out as a small bump then drained. Area appears to healed with some crusting/scabs around area.Pt is also having bilateral axillary tenderness with new sores appear that are tender to touch.  This last two bumps are not coming to a head, tender red and not draining on its own"..  The history is provided by the patient.    Past Medical History  Diagnosis Date  . Chest pain   . Cocaine abuse last use 2010  . Herpes   . MRSA (methicillin resistant Staphylococcus aureus)   . OSA (obstructive sleep apnea)     Past Surgical History  Procedure Laterality Date  . Hand surgery    . Esophagogastroduodenoscopy  06/10/2011    Procedure: ESOPHAGOGASTRODUODENOSCOPY (EGD);  Surgeon: Shirley Friar, MD;  Location: [facility] ENDOSCOPY;  Service: Endoscopy;  Laterality: N/A;  . Colonoscopy  06/10/2011    Procedure: COLONOSCOPY;  Surgeon: Shirley Friar, MD;  Location: [facility] ENDOSCOPY;  Service: Endoscopy;  Laterality: N/A;    Family History  Problem Relation Age of Onset  . Diabetes      History  Substance Use Topics  . Smoking status: Former Smoker    Types: Cigarettes  . Smokeless tobacco: Not on file  . Alcohol Use: No      Review of Systems  Constitutional: Negative for fever, chills, activity change and appetite change.  Skin: Positive for color change.    Allergies  Review of patient's allergies indicates no known allergies.  Home Medications   Current Outpatient Rx  Name  Route  Sig  Dispense   Refill  . doxycycline (VIBRAMYCIN) 100 MG capsule   Oral   Take 1 capsule (100 mg total) by mouth 2 (two) times daily.   20 capsule   0   . chlorhexidine (HIBICLENS) 4 % external liquid   Topical   Apply 60 mLs (4 application total) topically 2 (two) times a week.   473 mL   0   . omeprazole (PRILOSEC) 40 MG capsule   Oral   Take 1 capsule (40 mg total) by mouth 2 (two) times daily.   60 capsule   0   . sulfamethoxazole-trimethoprim (SEPTRA DS) 800-160 MG per tablet   Oral   Take 1 tablet by mouth every 12 (twelve) hours.   16 tablet   0   . sulfamethoxazole-trimethoprim (SEPTRA DS) 800-160 MG per tablet   Oral   Take 1 tablet by mouth 2 (two) times daily.   20 tablet   0     There were no vitals taken for this visit.  Physical Exam  Nursing note and vitals reviewed. Constitutional: Vital signs are normal. He appears well-developed.  Non-toxic appearance. He does not have a sickly appearance. He does not appear ill. No distress.  Neurological: He is alert.  Skin: There is erythema.     Psychiatric: He has a normal mood and affect.    ED Course  Procedures (including critical care time)  Labs Reviewed - No data to display  No results found.   1. Carbuncle and furuncle of trunk       MDM  Hibiclens-+ puncture wounds Rx orla antibiotics on thsi occasion sulfa- Advised patient to RTC if area become larger        Jimmie Molly, MD 03/30/12 1912

## 2012-03-30 NOTE — ED Notes (Signed)
Pt states that he was recently treated for mrsa and has finished antibiotics three weeks ago but has since had a new area appear on stomach that started out as a small bump then drained. Area appears to healed with some crusting/scabs around area.  Pt is also having bilateral axillary tenderness with new sores appear that are tender to touch.

## 2012-04-10 ENCOUNTER — Other Ambulatory Visit (HOSPITAL_COMMUNITY): Payer: Self-pay | Admitting: Internal Medicine

## 2012-04-10 ENCOUNTER — Ambulatory Visit (HOSPITAL_COMMUNITY)
Admission: RE | Admit: 2012-04-10 | Discharge: 2012-04-10 | Disposition: A | Discharge status: Home / Self Care | Payer: No Typology Code available for payment source | Source: Ambulatory Visit | Attending: Internal Medicine | Admitting: Internal Medicine

## 2012-04-10 DIAGNOSIS — R52 Pain, unspecified: Secondary | ICD-10-CM

## 2012-04-10 DIAGNOSIS — M25519 Pain in unspecified shoulder: Secondary | ICD-10-CM | POA: Insufficient documentation

## 2012-06-12 ENCOUNTER — Emergency Department (INDEPENDENT_AMBULATORY_CARE_PROVIDER_SITE_OTHER)
Admission: EM | Admit: 2012-06-12 | Discharge: 2012-06-12 | Disposition: A | Discharge status: Home / Self Care | Payer: No Typology Code available for payment source | Source: Home / Self Care | Attending: Emergency Medicine | Admitting: Emergency Medicine

## 2012-06-12 ENCOUNTER — Encounter (HOSPITAL_COMMUNITY): Payer: Self-pay | Admitting: Emergency Medicine

## 2012-06-12 DIAGNOSIS — L0291 Cutaneous abscess, unspecified: Secondary | ICD-10-CM

## 2012-06-12 DIAGNOSIS — L0390 Cellulitis, unspecified: Secondary | ICD-10-CM

## 2012-06-12 MED ORDER — RIFAMPIN 300 MG PO CAPS: 300.0000 mg | ORAL_CAPSULE | Freq: Two times a day (BID) | ORAL | Status: DC

## 2012-06-12 MED ORDER — MUPIROCIN 2 % EX OINT: TOPICAL_OINTMENT | Freq: Three times a day (TID) | CUTANEOUS | Status: DC

## 2012-06-12 MED ORDER — DOXYCYCLINE HYCLATE 100 MG PO TABS: 100.0000 mg | ORAL_TABLET | Freq: Two times a day (BID) | ORAL | Status: DC

## 2012-06-12 MED ORDER — CHLORHEXIDINE GLUCONATE 4 % EX LIQD: 60.0000 mL | Freq: Every day | CUTANEOUS | Status: DC | PRN

## 2012-06-12 NOTE — ED Notes (Addendum)
Pt c/o persistent skin infection to left side/ribcage areas ... Seen in March here at the [facility] and dx w/ carbuncle and furuncle ... Also seen by PCP last month for same reason... Both times was treated w/antibiotics.  Hx of MRSA Denies: fevers

## 2012-06-12 NOTE — ED Provider Notes (Signed)
Chief Complaint:   Chief Complaint  Patient presents with  . Cellulitis    History of Present Illness:   Harold Colon is a 57 year old male with a history of multiple recurrences of MRSA. These usually occur on his lateral chest wall area bilaterally just below his axilla. He has seen physicians multiple times for this and received many courses of antibiotics including doxycyclineand Septra. He's also tried mupirocin his nostrils and Hibiclens body wash. The MRSA infections always seemed to come back again. The current infection has been going on about 3 weeks he describes a small bump in his left lateral chest area which is draining a small amount of pus. This is very tiny. He's not had a fever or chills.  Review of Systems:  Other than noted above, the patient denies any of the following symptoms: Systemic:  No fever, chills, sweats, weight loss, or fatigue. ENT:  No nasal congestion, rhinorrhea, sore throat, swelling of lips, tongue or throat. Resp:  No cough, wheezing, or shortness of breath. Skin:  No rash, itching, nodules, or suspicious lesions.  PMFSH:  Past medical history, family history, social history, meds, and allergies were reviewed. His only other medication is hydrocodone which he takes for arthritis. He is being seen at the [facility] on [facility] He has a history of cocaine abuse in the past.  Physical Exam:   Vital signs:  BP 125/87  Pulse 80  Temp(Src) 97.9 F (36.6 C) (Oral)  Resp 20  SpO2 99% Gen:  Alert, oriented, in no distress. ENT:  Pharynx clear, no intraoral lesions, moist mucous membranes. Lungs:  Clear to auscultation. Skin:  He has multiple healed abscesses on both lateral chest wall areas, none is actually extend into the axilla. The only active abscess was a very tiny bump measuring no more than 8 mm in size. This was firm and nonfluctuant. It was draining a small amount of pus. Skin was otherwise clear.  Assessment:   The encounter diagnosis was Abscess.  It appears that he has had recurring episodes of MRSA has tried everything else to prevent it from recurring including courses of Septra, doxycycline, nasal mupirocin, and Hibiclens. The only thing he has not tried his account current course of rifampin plus another antibiotic, so we'll try this for 10 days. His return again if this gets worse in any way or if these recur.  Plan:   1.  The following meds were prescribed:   Discharge Medication List as of 06/12/2012  8:16 PM    START taking these medications   Details  chlorhexidine (HIBICLENS) 4 % external liquid Apply 60 mLs (4 application total) topically daily as needed., Starting 06/12/2012, Until Discontinued, Print    doxycycline (VIBRA-TABS) 100 MG tablet Take 1 tablet (100 mg total) by mouth 2 (two) times daily., Starting 06/12/2012, Until Discontinued, Print    mupirocin ointment (BACTROBAN) 2 % Apply topically 3 (three) times daily., Starting 06/12/2012, Until Discontinued, Print    rifampin (RIFADIN) 300 MG capsule Take 1 capsule (300 mg total) by mouth 2 (two) times daily., Starting 06/12/2012, Until Discontinued, Print       2.  The patient was instructed in symptomatic care and handouts were given. 3.  The patient was told to return if becoming worse in any way, if no better in 3 or 4 days, and given some red flag symptoms such as fever or worsening symptoms that would indicate earlier return. 4.  Follow up here  if worse in any way.     Reuben Likes, MD 06/12/12 2108

## 2012-08-04 ENCOUNTER — Emergency Department (INDEPENDENT_AMBULATORY_CARE_PROVIDER_SITE_OTHER)
Admission: EM | Admit: 2012-08-04 | Discharge: 2012-08-04 | Disposition: A | Discharge status: Home / Self Care | Payer: Medicaid Other | Source: Home / Self Care | Attending: Family Medicine | Admitting: Family Medicine

## 2012-08-04 ENCOUNTER — Encounter (HOSPITAL_COMMUNITY): Payer: Self-pay | Admitting: Emergency Medicine

## 2012-08-04 DIAGNOSIS — B372 Candidiasis of skin and nail: Secondary | ICD-10-CM

## 2012-08-04 DIAGNOSIS — M1711 Unilateral primary osteoarthritis, right knee: Secondary | ICD-10-CM

## 2012-08-04 HISTORY — DX: Unspecified osteoarthritis, unspecified site: M19.90

## 2012-08-04 MED ORDER — NYSTATIN-TRIAMCINOLONE 100000-0.1 UNIT/GM-% EX CREA: TOPICAL_CREAM | CUTANEOUS | Status: DC

## 2012-08-04 MED ORDER — METHYLPREDNISOLONE ACETATE 80 MG/ML IJ SUSP
INTRAMUSCULAR | Status: AC
  Filled 2012-08-04: qty 1

## 2012-08-04 MED ORDER — NAPROXEN 500 MG PO TABS: 500.0000 mg | ORAL_TABLET | Freq: Two times a day (BID) | ORAL | Status: DC

## 2012-08-04 MED ORDER — METHYLPREDNISOLONE ACETATE 40 MG/ML IJ SUSP
80.0000 mg | Freq: Once | INTRAMUSCULAR | Status: AC
  Administered 2012-08-04: 80 mg via INTRAMUSCULAR

## 2012-08-04 MED ORDER — HYDROCODONE-ACETAMINOPHEN 5-325 MG PO TABS: 1.0000 | ORAL_TABLET | Freq: Four times a day (QID) | ORAL | Status: DC | PRN

## 2012-08-04 NOTE — ED Provider Notes (Signed)
CSN: [id number]     Arrival date & time 08/04/12  1847 History     First MD Initiated Contact with Patient 08/04/12 2008     Chief Complaint  Patient presents with  . Knee Pain   (Consider location/radiation/quality/duration/timing/severity/associated sxs/prior Treatment) HPI Comments: 57 year old male with chronic bilateral knee and shoulder pain due to arthritis, presenting for eval of right knee getting a lot worse today. He reports that he has been walking a lot more than normal today and the knee started hurting today around 4 pm. The pain is worse on the medial side of the knee.  No injury, no trauma, no swelling. He has not taken anything for pain.  No fever, swelling, heat, numbness.    Also c/o dry itchy rash on the bottom of his stomach.  He has been treated before for a yeast rash in this area, it appears to him that this has come back.  The rash itches, worse when it is hot or he is sweating.  No swelling, redness, or discharge from the rash.      Patient is a 57 y.o. male presenting with knee pain.  Knee Pain Associated symptoms: no back pain, no fatigue, no fever and no neck pain     Past Medical History  Diagnosis Date  . Chest pain   . Cocaine abuse last use 2010  . Herpes   . MRSA (methicillin resistant Staphylococcus aureus)   . OSA (obstructive sleep apnea)   . Arthritis    Past Surgical History  Procedure Laterality Date  . Hand surgery    . Esophagogastroduodenoscopy  06/10/2011    Procedure: ESOPHAGOGASTRODUODENOSCOPY (EGD);  Surgeon: Shirley Friar, MD;  Location: [facility] ENDOSCOPY;  Service: Endoscopy;  Laterality: N/A;  . Colonoscopy  06/10/2011    Procedure: COLONOSCOPY;  Surgeon: Shirley Friar, MD;  Location: [facility] ENDOSCOPY;  Service: Endoscopy;  Laterality: N/A;   Family History  Problem Relation Age of Onset  . Diabetes     History  Substance Use Topics  . Smoking status: Former Smoker    Types: Cigarettes  . Smokeless tobacco: Not on file   . Alcohol Use: No    Review of Systems  Constitutional: Negative for fever, chills and fatigue.  HENT: Negative for sore throat, neck pain and neck stiffness.   Eyes: Negative for visual disturbance.  Respiratory: Negative for cough and shortness of breath.   Cardiovascular: Negative for chest pain, palpitations and leg swelling.  Gastrointestinal: Negative for nausea, vomiting, abdominal pain, diarrhea and constipation.  Genitourinary: Negative for dysuria, urgency, frequency and hematuria.  Musculoskeletal: Positive for arthralgias. Negative for myalgias, back pain and joint swelling.  Skin: Positive for rash.  Neurological: Negative for dizziness, weakness and light-headedness.    Allergies  Review of patient's allergies indicates no known allergies.  Home Medications   Current Outpatient Rx  Name  Route  Sig  Dispense  Refill  . chlorhexidine (HIBICLENS) 4 % external liquid   Topical   Apply 60 mLs (4 application total) topically daily as needed.   237 mL   0   . doxycycline (VIBRA-TABS) 100 MG tablet   Oral   Take 1 tablet (100 mg total) by mouth 2 (two) times daily.   20 tablet   0   . doxycycline (VIBRAMYCIN) 100 MG capsule   Oral   Take 1 capsule (100 mg total) by mouth 2 (two) times daily.   20 capsule   0   . HYDROcodone-acetaminophen (NORCO)  5-325 MG per tablet   Oral   Take 1 tablet by mouth every 6 (six) hours as needed for pain.   20 tablet   0   . mupirocin ointment (BACTROBAN) 2 %   Topical   Apply topically 3 (three) times daily.   22 g   0   . naproxen (NAPROSYN) 500 MG tablet   Oral   Take 1 tablet (500 mg total) by mouth 2 (two) times daily.   60 tablet   0   . nystatin-triamcinolone (MYCOLOG II) cream      Apply to affected area twice daily   60 g   0   . EXPIRED: omeprazole (PRILOSEC) 40 MG capsule   Oral   Take 1 capsule (40 mg total) by mouth 2 (two) times daily.   60 capsule   0   . rifampin (RIFADIN) 300 MG capsule    Oral   Take 1 capsule (300 mg total) by mouth 2 (two) times daily.   20 capsule   0   . sulfamethoxazole-trimethoprim (SEPTRA DS) 800-160 MG per tablet   Oral   Take 1 tablet by mouth every 12 (twelve) hours.   16 tablet   0    BP 133/90  Pulse 71  Temp(Src) 98.6 F (37 C) (Oral)  Resp 18  SpO2 96% Physical Exam  Constitutional: He is oriented to person, place, and time. He appears well-developed and well-nourished. No distress.  HENT:  Head: Normocephalic and atraumatic.  Abdominal: Soft. There is no tenderness.  Musculoskeletal:       Right knee: He exhibits swelling and effusion (minimal). He exhibits normal range of motion, no deformity, no erythema and no bony tenderness. Tenderness found. Medial joint line tenderness noted. No lateral joint line, no MCL, no LCL and no patellar tendon tenderness noted.  Neurological: He is oriented to person, place, and time.  Skin: Skin is warm and dry. Rash noted. Rash is maculopapular (dry, scaling rash, hyperpigmented, in the skin fold at the lower abdomen).  Psychiatric: He has a normal mood and affect. Judgment normal.    ED Course   Procedures (including critical care time)  Labs Reviewed - No data to display No results found. 1. Arthritis of knee, right   2. Candidal intertrigo     MDM  Treat with norco and naproxen, f/u with PCP.  I believe this pt needs to see ortho, however has medicaid so needs referral from PCP   Skin rash is intertrigo, dry, treat with nystatin/triamcinolone for 2 weeks    Meds ordered this encounter  Medications  . methylPREDNISolone acetate (DEPO-MEDROL) injection 80 mg    Sig:   . naproxen (NAPROSYN) 500 MG tablet    Sig: Take 1 tablet (500 mg total) by mouth 2 (two) times daily.    Dispense:  60 tablet    Refill:  0  . HYDROcodone-acetaminophen (NORCO) 5-325 MG per tablet    Sig: Take 1 tablet by mouth every 6 (six) hours as needed for pain.    Dispense:  20 tablet    Refill:  0  .  nystatin-triamcinolone (MYCOLOG II) cream    Sig: Apply to affected area twice daily    Dispense:  60 g    Refill:  0   \]  Graylon Good, PA-C 08/05/12 [phone]

## 2012-08-04 NOTE — ED Notes (Signed)
Pt c/o left knee pain onset 1600 today... Hx of arthritis on both knees... Denies inj/trauma... Reports he has been doing a lot of walking... sxs include: swelling and pain that increases w/activity... Alert w/no signs of acute distress.

## 2012-08-05 NOTE — ED Provider Notes (Signed)
Medical screening examination/treatment/procedure(s) were performed by resident physician or non-physician practitioner and as supervising physician I was immediately available for consultation/collaboration.   Barkley Bruns MD.   Linna Hoff, MD 08/05/12 2110

## 2012-10-02 ENCOUNTER — Ambulatory Visit (HOSPITAL_COMMUNITY)
Admission: RE | Admit: 2012-10-02 | Discharge: 2012-10-02 | Disposition: A | Discharge status: Home / Self Care | Payer: Medicaid Other | Source: Ambulatory Visit | Attending: Nurse Practitioner | Admitting: Nurse Practitioner

## 2012-10-02 ENCOUNTER — Other Ambulatory Visit (HOSPITAL_COMMUNITY): Payer: Self-pay | Admitting: Nurse Practitioner

## 2012-10-02 DIAGNOSIS — R52 Pain, unspecified: Secondary | ICD-10-CM

## 2012-10-02 DIAGNOSIS — M899 Disorder of bone, unspecified: Secondary | ICD-10-CM | POA: Insufficient documentation

## 2012-10-02 DIAGNOSIS — M25559 Pain in unspecified hip: Secondary | ICD-10-CM | POA: Insufficient documentation

## 2012-10-07 ENCOUNTER — Emergency Department (INDEPENDENT_AMBULATORY_CARE_PROVIDER_SITE_OTHER)
Admission: EM | Admit: 2012-10-07 | Discharge: 2012-10-07 | Disposition: A | Discharge status: Home / Self Care | Payer: Medicaid Other | Source: Home / Self Care

## 2012-10-07 ENCOUNTER — Encounter (HOSPITAL_COMMUNITY): Payer: Self-pay | Admitting: Emergency Medicine

## 2012-10-07 DIAGNOSIS — B372 Candidiasis of skin and nail: Secondary | ICD-10-CM

## 2012-10-07 MED ORDER — CLOTRIMAZOLE-BETAMETHASONE 1-0.05 % EX CREA: TOPICAL_CREAM | CUTANEOUS | Status: DC

## 2012-10-07 MED ORDER — NYSTATIN 100000 UNIT/GM EX POWD: Freq: Four times a day (QID) | CUTANEOUS | Status: DC

## 2012-10-07 NOTE — ED Provider Notes (Signed)
CSN: [id number]     Arrival date & time 10/07/12  1612 History   First MD Initiated Contact with Patient 10/07/12 1710     Chief Complaint  Patient presents with  . Rash    ?'s possible fungal infection of lower abdomen. comes and goes. itching.    (Consider location/radiation/quality/duration/timing/severity/associated sxs/prior Treatment) HPI Comments: 57 year old male who presents for a rash in the anterior abdominal folds off and on for 2 years. He has been seen here before for this and told it was a fungus. He received a prescription for an antifungal agent but did not get it filled. He states the rash is worse when he sweats and in the heat. It often itches. It has produced a darkened discoloration with a few papules over the intertriginous zone.   Past Medical History  Diagnosis Date  . Chest pain   . Cocaine abuse last use 2010  . Herpes   . MRSA (methicillin resistant Staphylococcus aureus)   . OSA (obstructive sleep apnea)   . Arthritis    Past Surgical History  Procedure Laterality Date  . Hand surgery    . Esophagogastroduodenoscopy  06/10/2011    Procedure: ESOPHAGOGASTRODUODENOSCOPY (EGD);  Surgeon: Shirley Friar, MD;  Location: [facility] ENDOSCOPY;  Service: Endoscopy;  Laterality: N/A;  . Colonoscopy  06/10/2011    Procedure: COLONOSCOPY;  Surgeon: Shirley Friar, MD;  Location: [facility] ENDOSCOPY;  Service: Endoscopy;  Laterality: N/A;   Family History  Problem Relation Age of Onset  . Diabetes     History  Substance Use Topics  . Smoking status: Former Smoker    Types: Cigarettes  . Smokeless tobacco: Not on file  . Alcohol Use: No    Review of Systems  All other systems reviewed and are negative.    Allergies  Review of patient's allergies indicates no known allergies.  Home Medications   Current Outpatient Rx  Name  Route  Sig  Dispense  Refill  . chlorhexidine (HIBICLENS) 4 % external liquid   Topical   Apply 60 mLs (4 application total) topically  daily as needed.   237 mL   0   . clotrimazole-betamethasone (LOTRISONE) cream      Apply to affected area 2 times daily prn   30 g   0   . doxycycline (VIBRA-TABS) 100 MG tablet   Oral   Take 1 tablet (100 mg total) by mouth 2 (two) times daily.   20 tablet   0   . doxycycline (VIBRAMYCIN) 100 MG capsule   Oral   Take 1 capsule (100 mg total) by mouth 2 (two) times daily.   20 capsule   0   . HYDROcodone-acetaminophen (NORCO) 5-325 MG per tablet   Oral   Take 1 tablet by mouth every 6 (six) hours as needed for pain.   20 tablet   0   . mupirocin ointment (BACTROBAN) 2 %   Topical   Apply topically 3 (three) times daily.   22 g   0   . naproxen (NAPROSYN) 500 MG tablet   Oral   Take 1 tablet (500 mg total) by mouth 2 (two) times daily.   60 tablet   0   . nystatin (MYCOSTATIN) powder   Topical   Apply topically 4 (four) times daily.   30 g   0   . nystatin-triamcinolone (MYCOLOG II) cream      Apply to affected area twice daily   60 g   0   .  EXPIRED: omeprazole (PRILOSEC) 40 MG capsule   Oral   Take 1 capsule (40 mg total) by mouth 2 (two) times daily.   60 capsule   0   . rifampin (RIFADIN) 300 MG capsule   Oral   Take 1 capsule (300 mg total) by mouth 2 (two) times daily.   20 capsule   0   . sulfamethoxazole-trimethoprim (SEPTRA DS) 800-160 MG per tablet   Oral   Take 1 tablet by mouth every 12 (twelve) hours.   16 tablet   0    BP 127/82  Pulse 70  Temp(Src) 98.3 F (36.8 C) (Oral)  Resp 18  SpO2 99% Physical Exam  Nursing note and vitals reviewed. Constitutional: He is oriented to person, place, and time. He appears well-developed and well-nourished. No distress.  Pulmonary/Chest: Effort normal. No respiratory distress.  Musculoskeletal: He exhibits no edema.  Neurological: He is alert and oriented to person, place, and time. He exhibits normal muscle tone.  Skin: Skin is warm and dry.  Anterior abdominal intertriginous  darkened pigmented discolored area of slightly moist rash purpura with flesh-colored papules. Consistent with Candida intertrigo.  Psychiatric: He has a normal mood and affect.    ED Course  Procedures (including critical care time) Labs Review Labs Reviewed - No data to display Imaging Review No results found.  MDM   1. Candidiasis, intertrigo      Lotrisone cream twice a day. After which if needed may by OTC Lotrimin AF or Lamisil cream to use twice a day. Keep area dry. Also may use nystatin powder one to 4 times a day as needed.  Hayden Rasmussen, NP 10/07/12 [phone]

## 2012-10-07 NOTE — ED Notes (Signed)
C/o possible fungal infection of lower abdomen. States that symptoms comes and goes. Pt has used neosporin with no relief in itching. States gradually getting worse.

## 2012-10-07 NOTE — ED Provider Notes (Signed)
Medical screening examination/treatment/procedure(s) were performed by non-physician practitioner and as supervising physician I was immediately available for consultation/collaboration.  Leslee Home, M.D.  Reuben Likes, MD 10/07/12 [phone]

## 2012-10-15 ENCOUNTER — Encounter: Payer: Self-pay | Admitting: Family Medicine

## 2012-10-15 DIAGNOSIS — G4733 Obstructive sleep apnea (adult) (pediatric): Secondary | ICD-10-CM

## 2012-10-15 NOTE — Progress Notes (Signed)
Patient ID: Harold Colon, male   DOB: [birth year], 56 y.o.   MRN: [id number] Signed DME form for BiPAP supplies: humidifier, pap tubing, filter disp uw pap device, full face mask, pap headgear.   Sleep study: 07/02/2011. Reviewed.   Severe obstructive sleep apnea/hypopnea syndrome, AHI of 68 per  hour with supine events, loud snoring and oxygen desaturation to a  nadir of 25% on room air.  2. Successful positive airway pressure titration at a maximum  continuous positive airway pressure setting of 20 CWP, obstructive  events were prevented, but significant snoring persisted. He was  switched to bilevel (BiPAP) and titrated to a final pressure of  inspiratory 24 CWP, expiratory 18 CWP. This is the recommended  initial pressure setting for home trial. This was associated with  AHI of 4.1 per hour. Snoring was prevented. He wore a large  ResMed Mirage Quattro full-face mask with heated humidifier and a  Bi-Flex setting of 3.  3. On arrival, baseline room air oxygen saturation was recorded as  75%. During the initial diagnostic recording, he desaturated to a  nadir of 25% on room air. Supplemental oxygen was provided by  protocol up to 2 liters/minute. With CPAP in place, oxygen was  discontinued briefly, but was still required and the rest of the  study was completed with supplemental oxygen at 2 liters. Mean  oxygen saturation during the titration phase, including some time  on room air, was 84.1%. Recommend initiation of home bilevel  positive airway pressure and then overnight oximetry to evaluate  need for addition of supplemental home oxygen through bilevel  positive airway pressure in the home environment. If the initial  room air oxygen saturation of 75% is correct, then there is  significant underlying cardiopulmonary disease, which also needs  evaluation.

## 2012-12-25 ENCOUNTER — Ambulatory Visit: Payer: Medicaid Other | Admitting: Cardiovascular Disease

## 2013-02-04 ENCOUNTER — Encounter (HOSPITAL_COMMUNITY): Payer: Self-pay | Admitting: Emergency Medicine

## 2013-02-04 ENCOUNTER — Emergency Department (HOSPITAL_COMMUNITY)
Admission: EM | Admit: 2013-02-04 | Discharge: 2013-02-04 | Disposition: A | Discharge status: Home / Self Care | Payer: Medicaid Other | Source: Home / Self Care | Attending: Family Medicine | Admitting: Family Medicine

## 2013-02-04 DIAGNOSIS — M549 Dorsalgia, unspecified: Secondary | ICD-10-CM

## 2013-02-04 LAB — POCT URINALYSIS DIP (DEVICE)
BILIRUBIN URINE: NEGATIVE
GLUCOSE, UA: NEGATIVE mg/dL
HGB URINE DIPSTICK: NEGATIVE
Ketones, ur: NEGATIVE mg/dL
Leukocytes, UA: NEGATIVE
Nitrite: NEGATIVE
PROTEIN: NEGATIVE mg/dL
Specific Gravity, Urine: 1.025 (ref 1.005–1.030)
Urobilinogen, UA: 2 mg/dL — ABNORMAL HIGH (ref 0.0–1.0)
pH: 6.5 (ref 5.0–8.0)

## 2013-02-04 MED ORDER — HYDROCODONE-ACETAMINOPHEN 5-325 MG PO TABS: 2.0000 | ORAL_TABLET | ORAL | Status: DC | PRN

## 2013-02-04 MED ORDER — IBUPROFEN 800 MG PO TABS: 800.0000 mg | ORAL_TABLET | Freq: Three times a day (TID) | ORAL | Status: DC

## 2013-02-04 NOTE — Discharge Instructions (Signed)
Back Exercises °Back exercises help treat and prevent back injuries. The goal of back exercises is to increase the strength of your abdominal and back muscles and the flexibility of your back. These exercises should be started when you no longer have back pain. Back exercises include: °· Pelvic Tilt. Lie on your back with your knees bent. Tilt your pelvis until the lower part of your back is against the floor. Hold this position 5 to 10 sec and repeat 5 to 10 times. °· Knee to Chest. Pull first 1 knee up against your chest and hold for 20 to 30 seconds, repeat this with the other knee, and then both knees. This may be done with the other leg straight or bent, whichever feels better. °· Sit-Ups or Curl-Ups. Bend your knees 90 degrees. Start with tilting your pelvis, and do a partial, slow sit-up, lifting your trunk only 30 to 45 degrees off the floor. Take at least 2 to 3 seconds for each sit-up. Do not do sit-ups with your knees out straight. If partial sit-ups are difficult, simply do the above but with only tightening your abdominal muscles and holding it as directed. °· Hip-Lift. Lie on your back with your knees flexed 90 degrees. Push down with your feet and shoulders as you raise your hips a couple inches off the floor; hold for 10 seconds, repeat 5 to 10 times. °· Back arches. Lie on your stomach, propping yourself up on bent elbows. Slowly press on your hands, causing an arch in your low back. Repeat 3 to 5 times. Any initial stiffness and discomfort should lessen with repetition over time. °· Shoulder-Lifts. Lie face down with arms beside your body. Keep hips and torso pressed to floor as you slowly lift your head and shoulders off the floor. °Do not overdo your exercises, especially in the beginning. Exercises may cause you some mild back discomfort which lasts for a few minutes; however, if the pain is more severe, or lasts for more than 15 minutes, do not continue exercises until you see your caregiver.  Improvement with exercise therapy for back problems is slow.  °See your caregivers for assistance with developing a proper back exercise program. °Document Released: 02/02/2004 Document Revised: 03/19/2011 Document Reviewed: 10/26/2010 °Exit[employer]® Patient Information ©2014 [employer]. ° °Back Pain, Adult °Low back pain is very common. About 1 in 5 people have back pain. The cause of low back pain is rarely dangerous. The pain often gets better over time. About half of people with a sudden onset of back pain feel better in just 2 weeks. About 8 in 10 people feel better by 6 weeks.  °CAUSES °Some common causes of back pain include: °· Strain of the muscles or ligaments supporting the spine. °· Wear and tear (degeneration) of the spinal discs. °· Arthritis. °· Direct injury to the back. °DIAGNOSIS °Most of the time, the direct cause of low back pain is not known. However, back pain can be treated effectively even when the exact cause of the pain is unknown. Answering your caregiver's questions about your overall health and symptoms is one of the most accurate ways to make sure the cause of your pain is not dangerous. If your caregiver needs more information, he or she may order lab work or imaging tests (X-rays or MRIs). However, even if imaging tests show changes in your back, this usually does not require surgery. °HOME CARE INSTRUCTIONS °For many people, back pain returns. Since low back pain is rarely dangerous, it is often a condition that people   can learn to manage on their own.  °· Remain active. It is stressful on the back to sit or stand in one place. Do not sit, drive, or stand in one place for more than 30 minutes at a time. Take short walks on level surfaces as soon as pain allows. Try to increase the length of time you walk each day. °· Do not stay in bed. Resting more than 1 or 2 days can delay your recovery. °· Do not avoid exercise or work. Your body is made to move. It is not dangerous to be active,  even though your back may hurt. Your back will likely heal faster if you return to being active before your pain is gone. °· Pay attention to your body when you  bend and lift. Many people have less discomfort when lifting if they bend their knees, keep the load close to their bodies, and avoid twisting. Often, the most comfortable positions are those that put less stress on your recovering back. °· Find a comfortable position to sleep. Use a firm mattress and lie on your side with your knees slightly bent. If you lie on your back, put a pillow under your knees. °· Only take over-the-counter or prescription medicines as directed by your caregiver. Over-the-counter medicines to reduce pain and inflammation are often the most helpful. Your caregiver may prescribe muscle relaxant drugs. These medicines help dull your pain so you can more quickly return to your normal activities and healthy exercise. °· Put ice on the injured area. °· Put ice in a plastic bag. °· Place a towel between your skin and the bag. °· Leave the ice on for 15-20 minutes, 03-04 times a day for the first 2 to 3 days. After that, ice and heat may be alternated to reduce pain and spasms. °· Ask your caregiver about trying back exercises and gentle massage. This may be of some benefit. °· Avoid feeling anxious or stressed. Stress increases muscle tension and can worsen back pain. It is important to recognize when you are anxious or stressed and learn ways to manage it. Exercise is a great option. °SEEK MEDICAL CARE IF: °· You have pain that is not relieved with rest or medicine. °· You have pain that does not improve in 1 week. °· You have new symptoms. °· You are generally not feeling well. °SEEK IMMEDIATE MEDICAL CARE IF:  °· You have pain that radiates from your back into your legs. °· You develop new bowel or bladder control problems. °· You have unusual weakness or numbness in your arms or legs. °· You develop nausea or vomiting. °· You develop  abdominal pain. °· You feel faint. °Document Released: 12/25/2004 Document Revised: 06/26/2011 Document Reviewed: 05/15/2010 °ExitCare® Patient Information ©2014 [employer]. ° °

## 2013-02-04 NOTE — ED Provider Notes (Signed)
CSN: [id number]     Arrival date & time 02/04/13  1857 History   First MD Initiated Contact with Patient 02/04/13 2019     Chief Complaint  Patient presents with  . Flank Pain   (Consider location/radiation/quality/duration/timing/severity/associated sxs/prior Treatment) Patient is a 58 y.o. male presenting with flank pain. The history is provided by the patient. No language interpreter was used.  Flank Pain This is a new problem. The problem occurs constantly. The problem has been gradually worsening. Pertinent negatives include no chest pain, no abdominal pain, no headaches and no shortness of breath. Nothing aggravates the symptoms. Nothing relieves the symptoms. He has tried nothing for the symptoms.  Pt complains of pain in his right flank, right back.  No fever, no cough, no uti symptoms.  Past Medical History  Diagnosis Date  . Chest pain   . Cocaine abuse last use 2010  . Herpes   . MRSA (methicillin resistant Staphylococcus aureus)   . OSA (obstructive sleep apnea)   . Arthritis    Past Surgical History  Procedure Laterality Date  . Hand surgery    . Esophagogastroduodenoscopy  06/10/2011    Procedure: ESOPHAGOGASTRODUODENOSCOPY (EGD);  Surgeon: Shirley Friar, MD;  Location: [facility] ENDOSCOPY;  Service: Endoscopy;  Laterality: N/A;  . Colonoscopy  06/10/2011    Procedure: COLONOSCOPY;  Surgeon: Shirley Friar, MD;  Location: [facility] ENDOSCOPY;  Service: Endoscopy;  Laterality: N/A;   Family History  Problem Relation Age of Onset  . Diabetes     History  Substance Use Topics  . Smoking status: Former Smoker    Types: Cigarettes  . Smokeless tobacco: Not on file  . Alcohol Use: No    Review of Systems  Respiratory: Negative for shortness of breath.   Cardiovascular: Negative for chest pain.  Gastrointestinal: Negative for abdominal pain.  Genitourinary: Positive for flank pain.  Neurological: Negative for headaches.  All other systems reviewed and are  negative.    Allergies  Review of patient's allergies indicates no known allergies.  Home Medications   Current Outpatient Rx  Name  Route  Sig  Dispense  Refill  . ibuprofen (ADVIL,MOTRIN) 200 MG tablet   Oral   Take 200 mg by mouth every 6 (six) hours as needed for mild pain.         . chlorhexidine (HIBICLENS) 4 % external liquid   Topical   Apply 60 mLs (4 application total) topically daily as needed.   237 mL   0   . clotrimazole-betamethasone (LOTRISONE) cream      Apply to affected area 2 times daily prn   30 g   0   . doxycycline (VIBRA-TABS) 100 MG tablet   Oral   Take 1 tablet (100 mg total) by mouth 2 (two) times daily.   20 tablet   0   . doxycycline (VIBRAMYCIN) 100 MG capsule   Oral   Take 1 capsule (100 mg total) by mouth 2 (two) times daily.   20 capsule   0   . HYDROcodone-acetaminophen (NORCO) 5-325 MG per tablet   Oral   Take 1 tablet by mouth every 6 (six) hours as needed for pain.   20 tablet   0   . mupirocin ointment (BACTROBAN) 2 %   Topical   Apply topically 3 (three) times daily.   22 g   0   . naproxen (NAPROSYN) 500 MG tablet   Oral   Take 1 tablet (500 mg total) by mouth  2 (two) times daily.   60 tablet   0   . nystatin (MYCOSTATIN) powder   Topical   Apply topically 4 (four) times daily.   30 g   0   . nystatin-triamcinolone (MYCOLOG II) cream      Apply to affected area twice daily   60 g   0   . EXPIRED: omeprazole (PRILOSEC) 40 MG capsule   Oral   Take 1 capsule (40 mg total) by mouth 2 (two) times daily.   60 capsule   0   . rifampin (RIFADIN) 300 MG capsule   Oral   Take 1 capsule (300 mg total) by mouth 2 (two) times daily.   20 capsule   0   . sulfamethoxazole-trimethoprim (SEPTRA DS) 800-160 MG per tablet   Oral   Take 1 tablet by mouth every 12 (twelve) hours.   16 tablet   0    BP 135/89  Pulse 66  Temp(Src) 97.3 F (36.3 C) (Oral)  Resp 16  SpO2 99% Physical Exam  Nursing note and  vitals reviewed. Constitutional: He is oriented to person, place, and time. He appears well-developed and well-nourished.  HENT:  Head: Normocephalic and atraumatic.  Mouth/Throat: Oropharynx is clear and moist.  Eyes: EOM are normal. Pupils are equal, round, and reactive to light.  Neck: Normal range of motion.  Cardiovascular: Normal rate and normal heart sounds.   Pulmonary/Chest: Effort normal.  Abdominal: Soft. He exhibits no distension.  Musculoskeletal:  Tender right flank  Neurological: He is alert and oriented to person, place, and time.  Skin: Skin is warm.  Psychiatric: He has a normal mood and affect.    ED Course  Procedures (including critical care time) Labs Review Labs Reviewed - No data to display Imaging Review No results found.  EKG Interpretation    Date/Time:    Ventricular Rate:    PR Interval:    QRS Duration:   QT Interval:    QTC Calculation:   R Axis:     Text Interpretation:              MDM Ua is negative,   No pneumonia symptoms.  I will treat pain.   1. Back pain    Ibuprofen and hydrocodone.      Lonia SkinnerLeslie K Rapids CitySofia, PA-C 02/04/13 2057

## 2013-02-04 NOTE — ED Notes (Addendum)
C/o R flank pain onset 1 week ago. It went away after a couple of days and came back yesterday.  No urinary symptoms, no hematuria, no injury.  No hx. of kidney stones.  He said the last time he had back pain he had pneumonia.  Denies any cold symptoms.

## 2013-02-05 ENCOUNTER — Ambulatory Visit (INDEPENDENT_AMBULATORY_CARE_PROVIDER_SITE_OTHER): Payer: Medicaid Other | Admitting: Cardiovascular Disease

## 2013-02-05 ENCOUNTER — Encounter: Payer: Self-pay | Admitting: Cardiovascular Disease

## 2013-02-05 VITALS — BP 142/97 | HR 66 | Ht 73.0 in | Wt 237.0 lb

## 2013-02-05 DIAGNOSIS — R079 Chest pain, unspecified: Secondary | ICD-10-CM

## 2013-02-05 NOTE — Patient Instructions (Signed)
Your physician recommends that you schedule a follow-up appointment with Dr. Excell Seltzerooper as needed.  [phone]  Your physician recommends that you continue on your current medications as directed. Please refer to the Current Medication list given to you today.

## 2013-02-05 NOTE — Progress Notes (Signed)
HPI:   58 year old gentleman presenting for followup evaluation. He was last seen in July 2012 for atypical chest pain. At that time he underwent a nuclear stress test that was low risk. An echocardiogram demonstrated normal LV function without regional wall motion abnormalities. He was scheduled for when necessary followup.  The patient has been doing fairly well. He has some chest pain only when he bends forward. He has no exertional symptoms. He's lost a good bit of weight through dietary changes. In fact, his weight is down almost 40 pounds since 2013. The patient specifically denies exertional chest pressure, shortness of breath, leg swelling, or palpitations. He's been compliant with his medications. He now wears CPAP for obstructive sleep apnea and he feels this has markedly improved his energy level and overall sense of well-being. He continues to smoke one pack of cigarettes daily. He rarely drinks alcohol.  Outpatient Encounter Prescriptions as of 02/05/2013  Medication Sig  . chlorhexidine (HIBICLENS) 4 % external liquid Apply 60 mLs (4 application total) topically daily as needed.  . clotrimazole-betamethasone (LOTRISONE) cream Apply to affected area 2 times daily prn  . HYDROcodone-acetaminophen (NORCO/VICODIN) 5-325 MG per tablet Take 2 tablets by mouth every 4 (four) hours as needed for moderate pain.  [name]. ibuprofen (ADVIL,MOTRIN) 800 MG tablet Take 1 tablet (800 mg total) by mouth 3 (three) times daily.  . mupirocin ointment (BACTROBAN) 2 % Apply topically 3 (three) times daily.  . naproxen (NAPROSYN) 500 MG tablet Take 1 tablet (500 mg total) by mouth 2 (two) times daily.  [name]. nystatin (MYCOSTATIN) powder Apply topically 4 (four) times daily.  [name]. nystatin-triamcinolone (MYCOLOG II) cream Apply to affected area twice daily  . rifampin (RIFADIN) 300 MG capsule Take 1 capsule (300 mg total) by mouth 2 (two) times daily.  . [DISCONTINUED] doxycycline (VIBRA-TABS) 100 MG tablet Take 1  tablet (100 mg total) by mouth 2 (two) times daily.  . [DISCONTINUED] doxycycline (VIBRAMYCIN) 100 MG capsule Take 1 capsule (100 mg total) by mouth 2 (two) times daily.  . [DISCONTINUED] HYDROcodone-acetaminophen (NORCO) 5-325 MG per tablet Take 1 tablet by mouth every 6 (six) hours as needed for pain.  . [DISCONTINUED] ibuprofen (ADVIL,MOTRIN) 200 MG tablet Take 200 mg by mouth every 6 (six) hours as needed for mild pain.  . [DISCONTINUED] omeprazole (PRILOSEC) 40 MG capsule Take 1 capsule (40 mg total) by mouth 2 (two) times daily.  . [DISCONTINUED] sulfamethoxazole-trimethoprim (SEPTRA DS) 800-160 MG per tablet Take 1 tablet by mouth every 12 (twelve) hours.    No Known Allergies  Past Medical History  Diagnosis Date  . Chest pain   . Cocaine abuse last use 2010  . Herpes   . MRSA (methicillin resistant Staphylococcus aureus)   . OSA (obstructive sleep apnea)   . Arthritis     ROS: Negative except as per HPI  BP 142/97  Pulse 66  Ht 6\' 1"  (1.854 m)  Wt 237 lb (107.502 kg)  BMI 31.27 kg/m2  PHYSICAL EXAM: Blood pressure on my recheck is 136/86 Pt is alert and oriented, NAD HEENT: normal Neck: JVP - normal, carotids 2+= without bruits Lungs: CTA bilaterally CV: RRR without murmur or gallop Abd: soft, NT, Positive BS, no hepatomegaly Ext: no C/C/E, distal pulses intact and equal Skin: warm/dry no rash  EKG:  Normal sinus rhythm 81 beats per minute, within normal limits.  ASSESSMENT AND PLAN: 1. Chest pain, atypical. He is not particularly bothered by this. Symptoms only occur when he bends  forward. No further evaluation indicated at this time. Suspect noncardiac symptoms.  2. High blood pressure. A repeat blood pressure was within limits. I reviewed the trend of his blood pressure over several office readings and overall his blood pressure control is adequate. He will need to keep an eye on this. Low sodium diet was recommended.  3. Tobacco abuse. Cessation counseling  done. He is considering nicotine patches  For followup I will see him back as needed.   Cirilo Canner 02/05/2013 12:08 PM

## 2013-02-07 NOTE — ED Provider Notes (Signed)
Medical screening examination/treatment/procedure(s) were performed by a resident physician or non-physician practitioner and as the supervising physician I was immediately available for consultation/collaboration.  Consuello Lassalle, MD   Alson Mcpheeters S Aadon Gorelik, MD 02/07/13 0828 

## 2013-08-05 ENCOUNTER — Emergency Department (INDEPENDENT_AMBULATORY_CARE_PROVIDER_SITE_OTHER)
Admission: EM | Admit: 2013-08-05 | Discharge: 2013-08-05 | Disposition: A | Discharge status: Home / Self Care | Payer: Medicaid Other | Source: Home / Self Care | Attending: Family Medicine | Admitting: Family Medicine

## 2013-08-05 ENCOUNTER — Encounter (HOSPITAL_COMMUNITY): Payer: Self-pay | Admitting: Emergency Medicine

## 2013-08-05 DIAGNOSIS — IMO0002 Reserved for concepts with insufficient information to code with codable children: Secondary | ICD-10-CM

## 2013-08-05 DIAGNOSIS — M25569 Pain in unspecified knee: Secondary | ICD-10-CM

## 2013-08-05 DIAGNOSIS — M1710 Unilateral primary osteoarthritis, unspecified knee: Secondary | ICD-10-CM

## 2013-08-05 DIAGNOSIS — M25562 Pain in left knee: Secondary | ICD-10-CM

## 2013-08-05 MED ORDER — NAPROXEN 500 MG PO TABS: 500.0000 mg | ORAL_TABLET | Freq: Two times a day (BID) | ORAL | Status: DC

## 2013-08-05 MED ORDER — HYDROCODONE-ACETAMINOPHEN 5-325 MG PO TABS: 1.0000 | ORAL_TABLET | Freq: Four times a day (QID) | ORAL | Status: DC | PRN

## 2013-08-05 NOTE — Discharge Instructions (Signed)
Arthritis, Nonspecific °Arthritis is inflammation of a joint. This usually means pain, redness, warmth or swelling are present. One or more joints may be involved. There are a number of types of arthritis. Your caregiver may not be able to tell what type of arthritis you have right away. °CAUSES  °The most common cause of arthritis is the wear and tear on the joint (osteoarthritis). This causes damage to the cartilage, which can break down over time. The knees, hips, back and neck are most often affected by this type of arthritis. °Other types of arthritis and common causes of joint pain include: °· Sprains and other injuries near the joint. Sometimes minor sprains and injuries cause pain and swelling that develop hours later. °· Rheumatoid arthritis. This affects hands, feet and knees. It usually affects both sides of your body at the same time. It is often associated with chronic ailments, fever, weight loss and general weakness. °· Crystal arthritis. Gout and pseudo gout can cause occasional acute severe pain, redness and swelling in the foot, ankle, or knee. °· Infectious arthritis. Bacteria can get into a joint through a break in overlying skin. This can cause infection of the joint. Bacteria and viruses can also spread through the blood and affect your joints. °· Drug, infectious and allergy reactions. Sometimes joints can become mildly painful and slightly swollen with these types of illnesses. °SYMPTOMS  °· Pain is the main symptom. °· Your joint or joints can also be red, swollen and warm or hot to the touch. °· You may have a fever with certain types of arthritis, or even feel overall ill. °· The joint with arthritis will hurt with movement. Stiffness is present with some types of arthritis. °DIAGNOSIS  °Your caregiver will suspect arthritis based on your description of your symptoms and on your exam. Testing may be needed to find the type of arthritis: °· Blood and sometimes urine tests. °· X-ray tests  and sometimes CT or MRI scans. °· Removal of fluid from the joint (arthrocentesis) is done to check for bacteria, crystals or other causes. Your caregiver (or a specialist) will numb the area over the joint with a local anesthetic, and use a needle to remove joint fluid for examination. This procedure is only minimally uncomfortable. °· Even with these tests, your caregiver may not be able to tell what kind of arthritis you have. Consultation with a specialist (rheumatologist) may be helpful. °TREATMENT  °Your caregiver will discuss with you treatment specific to your type of arthritis. If the specific type cannot be determined, then the following general recommendations may apply. °Treatment of severe joint pain includes: °· Rest. °· Elevation. °· Anti-inflammatory medication (for example, ibuprofen) may be prescribed. Avoiding activities that cause increased pain. °· Only take over-the-counter or prescription medicines for pain and discomfort as recommended by your caregiver. °· Cold packs over an inflamed joint may be used for 10 to 15 minutes every hour. Hot packs sometimes feel better, but do not use overnight. Do not use hot packs if you are diabetic without your caregiver's permission. °· A cortisone shot into arthritic joints may help reduce pain and swelling. °· Any acute arthritis that gets worse over the next 1 to 2 days needs to be looked at to be sure there is no joint infection. °Long-term arthritis treatment involves modifying activities and lifestyle to reduce joint stress jarring. This can include weight loss. Also, exercise is needed to nourish the joint cartilage and remove waste. This helps keep the muscles   around the joint strong. °HOME CARE INSTRUCTIONS  °· Do not take aspirin to relieve pain if gout is suspected. This elevates uric acid levels. °· Only take over-the-counter or prescription medicines for pain, discomfort or fever as directed by your caregiver. °· Rest the joint as much as  possible. °· If your joint is swollen, keep it elevated. °· Use crutches if the painful joint is in your leg. °· Drinking plenty of fluids may help for certain types of arthritis. °· Follow your caregiver's dietary instructions. °· Try low-impact exercise such as: °¨ Swimming. °¨ Water aerobics. °¨ Biking. °¨ Walking. °· Morning stiffness is often relieved by a warm shower. °· Put your joints through regular range-of-motion. °SEEK MEDICAL CARE IF:  °· You do not feel better in 24 hours or are getting worse. °· You have side effects to medications, or are not getting better with treatment. °SEEK IMMEDIATE MEDICAL CARE IF:  °· You have a fever. °· You develop severe joint pain, swelling or redness. °· Many joints are involved and become painful and swollen. °· There is severe back pain and/or leg weakness. °· You have loss of bowel or bladder control. °Document Released: 02/02/2004 Document Revised: 03/19/2011 Document Reviewed: 02/18/2008 °Exit[employer]® Patient Information ©2015 [employer]. This information is not intended to replace advice given to you by your health care provider. Make sure you discuss any questions you have with your health care provider. ° °Knee Pain °The knee is the complex joint between your thigh and your lower leg. It is made up of bones, tendons, ligaments, and cartilage. The bones that make up the knee are: °· The femur in the thigh. °· The tibia and fibula in the lower leg. °· The patella or kneecap riding in the groove on the lower femur. °CAUSES  °Knee pain is a common complaint with many causes. A few of these causes are: °· Injury, such as: °¨ A ruptured ligament or tendon injury. °¨ Torn cartilage. °· Medical conditions, such as: °¨ Gout °¨ Arthritis °¨ Infections °· Overuse, over training, or overdoing a physical activity. °Knee pain can be minor or severe. Knee pain can accompany debilitating injury. Minor knee problems often respond well to self-care measures or get well on their  own. More serious injuries may need medical intervention or even surgery. °SYMPTOMS °The knee is complex. Symptoms of knee problems can vary widely. Some of the problems are: °· Pain with movement and weight bearing. °· Swelling and tenderness. °· Buckling of the knee. °· Inability to straighten or extend your knee. °· Your knee locks and you cannot straighten it. °· Warmth and redness with pain and fever. °· Deformity or dislocation of the kneecap. °DIAGNOSIS  °Determining what is wrong may be very straight forward such as when there is an injury. It can also be challenging because of the complexity of the knee. Tests to make a diagnosis may include: °· Your caregiver taking a history and doing a physical exam. °· Routine X-rays can be used to rule out other problems. X-rays will not reveal a cartilage tear. Some injuries of the knee can be diagnosed by: °¨ Arthroscopy a surgical technique by which a small video camera is inserted through tiny incisions on the sides of the knee. This procedure is used to examine and repair internal knee joint problems. Tiny instruments can be used during arthroscopy to repair the torn knee cartilage (meniscus). °¨ Arthrography is a radiology technique. A contrast liquid is directly injected into the knee   joint. Internal structures of the knee joint then become visible on X-ray film. °¨ An MRI scan is a non X-ray radiology procedure in which magnetic fields and a computer produce two- or three-dimensional images of the inside of the knee. Cartilage tears are often visible using an MRI scanner. MRI scans have largely replaced arthrography in diagnosing cartilage tears of the knee. °· Blood work. °· Examination of the fluid that helps to lubricate the knee joint (synovial fluid). This is done by taking a sample out using a needle and a syringe. °TREATMENT °The treatment of knee problems depends on the cause. Some of these treatments are: °· Depending on the injury, proper casting,  splinting, surgery, or physical therapy care will be needed. °· Give yourself adequate recovery time. Do not overuse your joints. If you begin to get sore during workout routines, back off. Slow down or do fewer repetitions. °· For repetitive activities such as cycling or running, maintain your strength and nutrition. °· Alternate muscle groups. For example, if you are a weight lifter, work the upper body on one day and the lower body the next. °· Either tight or weak muscles do not give the proper support for your knee. Tight or weak muscles do not absorb the stress placed on the knee joint. Keep the muscles surrounding the knee strong. °· Take care of mechanical problems. °¨ If you have flat feet, orthotics or special shoes may help. See your caregiver if you need help. °¨ Arch supports, sometimes with wedges on the inner or outer aspect of the heel, can help. These can shift pressure away from the side of the knee most bothered by osteoarthritis. °¨ A brace called an "unloader" brace also may be used to help ease the pressure on the most arthritic side of the knee. °· If your caregiver has prescribed crutches, braces, wraps or ice, use as directed. The acronym for this is PRICE. This means protection, rest, ice, compression, and elevation. °· Nonsteroidal anti-inflammatory drugs (NSAIDs), can help relieve pain. But if taken immediately after an injury, they may actually increase swelling. Take NSAIDs with food in your stomach. Stop them if you develop stomach problems. Do not take these if you have a history of ulcers, stomach pain, or bleeding from the bowel. Do not take without your caregiver's approval if you have problems with fluid retention, heart failure, or kidney problems. °· For ongoing knee problems, physical therapy may be helpful. °· Glucosamine and chondroitin are over-the-counter dietary supplements. Both may help relieve the pain of osteoarthritis in the knee. These medicines are different from  the usual anti-inflammatory drugs. Glucosamine may decrease the rate of cartilage destruction. °· Injections of a corticosteroid drug into your knee joint may help reduce the symptoms of an arthritis flare-up. They may provide pain relief that lasts a few months. You may have to wait a few months between injections. The injections do have a small increased risk of infection, water retention, and elevated blood sugar levels. °· Hyaluronic acid injected into damaged joints may ease pain and provide lubrication. These injections may work by reducing inflammation. A series of shots may give relief for as long as 6 months. °· Topical painkillers. Applying certain ointments to your skin may help relieve the pain and stiffness of osteoarthritis. Ask your pharmacist for suggestions. Many over the-counter products are approved for temporary relief of arthritis pain. °· In some countries, doctors often prescribe topical NSAIDs for relief of chronic conditions such as arthritis and tendinitis.   A review of treatment with NSAID creams found that they worked as well as oral medications but without the serious side effects. °PREVENTION °· Maintain a healthy weight. Extra pounds put more strain on your joints. °· Get strong, stay limber. Weak muscles are a common cause of knee injuries. Stretching is important. Include flexibility exercises in your workouts. °· Be smart about exercise. If you have osteoarthritis, chronic knee pain or recurring injuries, you may need to change the way you exercise. This does not mean you have to stop being active. If your knees ache after jogging or playing basketball, consider switching to swimming, water aerobics, or other low-impact activities, at least for a few days a week. Sometimes limiting high-impact activities will provide relief. °· Make sure your shoes fit well. Choose footwear that is right for your sport. °· Protect your knees. Use the proper gear for knee-sensitive activities. Use  kneepads when playing volleyball or laying carpet. Buckle your seat belt every time you drive. Most shattered kneecaps occur in car accidents. °· Rest when you are tired. °SEEK MEDICAL CARE IF:  °You have knee pain that is continual and does not seem to be getting better.  °SEEK IMMEDIATE MEDICAL CARE IF:  °Your knee joint feels hot to the touch and you have a high fever. °MAKE SURE YOU:  °· Understand these instructions. °· Will watch your condition. °· Will get help right away if you are not doing well or get worse. °Document Released: 10/22/2006 Document Revised: 03/19/2011 Document Reviewed: 10/22/2006 °ExitCare® Patient Information ©2015 [employer]. This information is not intended to replace advice given to you by your health care provider. Make sure you discuss any questions you have with your health care provider. ° °

## 2013-08-05 NOTE — ED Provider Notes (Signed)
Medical screening examination/treatment/procedure(s) were performed by resident physician or non-physician practitioner and as supervising physician I was immediately available for consultation/collaboration.   Sharifah Champine DOUGLAS MD.   Pennye Beeghly D Bentlee Benningfield, MD 08/05/13 2017 

## 2013-08-05 NOTE — ED Provider Notes (Signed)
CSN: [id number]     Arrival date & time 08/05/13  1815 History   First MD Initiated Contact with Patient 08/05/13 1854     Chief Complaint  Patient presents with  . Knee Pain   (Consider location/radiation/quality/duration/timing/severity/associated sxs/prior Treatment) HPI Comments: 58 year old male with history of chronic bilateral knee pain and chronic bilateral shoulder pain from osteoarthritis presents complaining of significant worsening of the pain in his medial left knee for the past couple of weeks. This has been since he ran out of his Naprosyn, he has been taking ibuprofen since then and it does not help as much. He has frequent popping in the knee and the knee gives way. It occasionally swells, but no acute swelling in the past couple weeks. He has never seen an orthopedic doctor about this. He denies any new injury to the knee. The pain is worse at night  Patient is a 58 y.o. male presenting with knee pain.  Knee Pain   Past Medical History  Diagnosis Date  . Chest pain   . Cocaine abuse last use 2010  . Herpes   . MRSA (methicillin resistant Staphylococcus aureus)   . OSA (obstructive sleep apnea)   . Arthritis    Past Surgical History  Procedure Laterality Date  . Hand surgery Left 2009    ring finger- tendon repair  . Esophagogastroduodenoscopy  06/10/2011    Procedure: ESOPHAGOGASTRODUODENOSCOPY (EGD);  Surgeon: Shirley FriarVincent C. Schooler, MD;  [facility];  Service: Endoscopy;  Laterality: N/A;  . Colonoscopy  06/10/2011    Procedure: COLONOSCOPY;  Surgeon: Shirley FriarVincent C. Schooler, MD;  [facility];  Service: Endoscopy;  Laterality: N/A;   Family History  Problem Relation Age of Onset  . Diabetes    . Heart failure Mother   . Diabetes Mother   . Cancer Father     colon   History  Substance Use Topics  . Smoking status: Current Every Day Smoker -- 1.00 packs/day    Types: Cigarettes  . Smokeless tobacco: Not on file  . Alcohol Use: Yes      Comment: occasional    Review of Systems  Musculoskeletal: Positive for arthralgias and joint swelling. Negative for myalgias.  All other systems reviewed and are negative.   Allergies  Review of patient's allergies indicates no known allergies.  Home Medications   Prior to Admission medications   Medication Sig Start Date End Date Taking? Authorizing Provider  ibuprofen (ADVIL,MOTRIN) 800 MG tablet Take 1 tablet (800 mg total) by mouth 3 (three) times daily. 02/04/13  Yes Lonia SkinnerLeslie K Sofia, PA-C  chlorhexidine (HIBICLENS) 4 % external liquid Apply 60 mLs (4 application total) topically daily as needed. 06/12/12   Reuben Likesavid C Keller, MD  clotrimazole-betamethasone (LOTRISONE) cream Apply to affected area 2 times daily prn 10/07/12   Hayden Rasmussenavid Mabe, NP  HYDROcodone-acetaminophen (NORCO) 5-325 MG per tablet Take 1 tablet by mouth every 6 (six) hours as needed for moderate pain. 08/05/13   Graylon GoodZachary H Haylee Mcanany, PA-C  HYDROcodone-acetaminophen (NORCO/VICODIN) 5-325 MG per tablet Take 2 tablets by mouth every 4 (four) hours as needed for moderate pain. 02/04/13   Elson AreasLeslie K Sofia, PA-C  mupirocin ointment (BACTROBAN) 2 % Apply topically 3 (three) times daily. 06/12/12   Reuben Likesavid C Keller, MD  naproxen (NAPROSYN) 500 MG tablet Take 1 tablet (500 mg total) by mouth 2 (two) times daily. 08/04/12   Graylon GoodZachary H Verlaine Embry, PA-C  naproxen (NAPROSYN) 500 MG tablet Take 1 tablet (500 mg total) by mouth 2 (  two) times daily. 08/05/13   Adrian Blackwater Emberleigh Reily, PA-C  nystatin (MYCOSTATIN) powder Apply topically 4 (four) times daily. 10/07/12   Hayden Rasmussen, NP  nystatin-triamcinolone [facility]) cream Apply to affected area twice daily 08/04/12   Graylon Good, PA-C  rifampin (RIFADIN) 300 MG capsule Take 1 capsule (300 mg total) by mouth 2 (two) times daily. 06/12/12   Reuben Likes, MD   BP 148/97  Pulse 74  Temp(Src) 98.5 F (36.9 C) (Oral)  Resp 18  Ht 6\' 1"  (1.854 m)  Wt 250 lb (113.399 kg)  BMI 32.99 kg/m2  SpO2 98% Physical Exam   Nursing note and vitals reviewed. Constitutional: He is oriented to person, place, and time. He appears well-developed and well-nourished. No distress.  HENT:  Head: Normocephalic.  Pulmonary/Chest: Effort normal. No respiratory distress.  Musculoskeletal:       Left knee: He exhibits decreased range of motion (secondary to pain ), swelling, bony tenderness (medial tibial plateau) and abnormal meniscus (positive mcmurrays test for medial meniscus ). He exhibits no effusion, no LCL laxity, normal patellar mobility and no MCL laxity. Tenderness found. Medial joint line tenderness noted.  Neurological: He is alert and oriented to person, place, and time. He has normal strength and normal reflexes. No sensory deficit. Coordination normal.  Skin: Skin is warm and dry. No rash noted. He is not diaphoretic.  Psychiatric: He has a normal mood and affect. Judgment normal.    ED Course  Procedures (including critical care time) Labs Review Labs Reviewed - No data to display  Imaging Review No results found.   MDM   1. Left knee pain   2. Arthritis of knee    This is worsening of chronic arthritis versus meniscal injury. Treat with Ace wrap for support, Naprosyn, Norco, referred to orthopedics for followup.   Meds ordered this encounter  Medications  . naproxen (NAPROSYN) 500 MG tablet    Sig: Take 1 tablet (500 mg total) by mouth 2 (two) times daily.    Dispense:  60 tablet    Refill:  5    Order Specific Question:  Supervising Provider    Answer:  MERRELL, DAVID J [[id number]]  . HYDROcodone-acetaminophen (NORCO) 5-325 MG per tablet    Sig: Take 1 tablet by mouth every 6 (six) hours as needed for moderate pain.    Dispense:  30 tablet    Refill:  0    Order Specific Question:  Supervising Provider    Answer:  MERRELL, DAVID J [[id number]]       Graylon Good, PA-C 08/05/13 1924

## 2013-08-05 NOTE — ED Notes (Signed)
C/o pain to L knee for 3 yrs.  States its arthritis.  No new injury.  States its been giving away and swelled up.

## 2013-08-18 ENCOUNTER — Encounter: Payer: Self-pay | Admitting: Family Medicine

## 2013-08-18 ENCOUNTER — Ambulatory Visit (INDEPENDENT_AMBULATORY_CARE_PROVIDER_SITE_OTHER): Payer: Medicaid Other | Admitting: Family Medicine

## 2013-08-18 VITALS — BP 119/80 | HR 91 | Temp 98.4°F | Ht 73.0 in | Wt 245.0 lb

## 2013-08-18 DIAGNOSIS — M25569 Pain in unspecified knee: Secondary | ICD-10-CM

## 2013-08-18 DIAGNOSIS — M25561 Pain in right knee: Secondary | ICD-10-CM

## 2013-08-18 MED ORDER — MELOXICAM 15 MG PO TABS: 15.0000 mg | ORAL_TABLET | Freq: Every day | ORAL | Status: DC

## 2013-08-18 NOTE — Patient Instructions (Signed)
Arthritis, Nonspecific Arthritis is inflammation of a joint. This usually means pain, redness, warmth or swelling are present. One or more joints may be involved. There are a number of types of arthritis. Your caregiver may not be able to tell what type of arthritis you have right away. CAUSES  The most common cause of arthritis is the wear and tear on the joint (osteoarthritis). This causes damage to the cartilage, which can break down over time. The knees, hips, back and neck are most often affected by this type of arthritis. Other types of arthritis and common causes of joint pain include:  Sprains and other injuries near the joint. Sometimes minor sprains and injuries cause pain and swelling that develop hours later.  Rheumatoid arthritis. This affects hands, feet and knees. It usually affects both sides of your body at the same time. It is often associated with chronic ailments, fever, weight loss and general weakness.  Crystal arthritis. Gout and pseudo gout can cause occasional acute severe pain, redness and swelling in the foot, ankle, or knee.  Infectious arthritis. Bacteria can get into a joint through a break in overlying skin. This can cause infection of the joint. Bacteria and viruses can also spread through the blood and affect your joints.  Drug, infectious and allergy reactions. Sometimes joints can become mildly painful and slightly swollen with these types of illnesses. SYMPTOMS   Pain is the main symptom.  Your joint or joints can also be red, swollen and warm or hot to the touch.  You may have a fever with certain types of arthritis, or even feel overall ill.  The joint with arthritis will hurt with movement. Stiffness is present with some types of arthritis. DIAGNOSIS  Your caregiver will suspect arthritis based on your description of your symptoms and on your exam. Testing may be needed to find the type of arthritis:  Blood and sometimes urine tests.  X-ray tests  and sometimes CT or MRI scans.  Removal of fluid from the joint (arthrocentesis) is done to check for bacteria, crystals or other causes. Your caregiver (or a specialist) will numb the area over the joint with a local anesthetic, and use a needle to remove joint fluid for examination. This procedure is only minimally uncomfortable.  Even with these tests, your caregiver may not be able to tell what kind of arthritis you have. Consultation with a specialist (rheumatologist) may be helpful. TREATMENT  Your caregiver will discuss with you treatment specific to your type of arthritis. If the specific type cannot be determined, then the following general recommendations may apply. Treatment of severe joint pain includes:  Rest.  Elevation.  Anti-inflammatory medication (for example, ibuprofen) may be prescribed. Avoiding activities that cause increased pain.  Only take over-the-counter or prescription medicines for pain and discomfort as recommended by your caregiver.  Cold packs over an inflamed joint may be used for 10 to 15 minutes every hour. Hot packs sometimes feel better, but do not use overnight. Do not use hot packs if you are diabetic without your caregiver's permission.  A cortisone shot into arthritic joints may help reduce pain and swelling.  Any acute arthritis that gets worse over the next 1 to 2 days needs to be looked at to be sure there is no joint infection. Long-term arthritis treatment involves modifying activities and lifestyle to reduce joint stress jarring. This can include weight loss. Also, exercise is needed to nourish the joint cartilage and remove waste. This helps keep the muscles   around the joint strong. HOME CARE INSTRUCTIONS   Do not take aspirin to relieve pain if gout is suspected. This elevates uric acid levels.  Only take over-the-counter or prescription medicines for pain, discomfort or fever as directed by your caregiver.  Rest the joint as much as  possible.  If your joint is swollen, keep it elevated.  Use crutches if the painful joint is in your leg.  Drinking plenty of fluids may help for certain types of arthritis.  Follow your caregiver's dietary instructions.  Try low-impact exercise such as:  Swimming.  Water aerobics.  Biking.  Walking.  Morning stiffness is often relieved by a warm shower.  Put your joints through regular range-of-motion. SEEK MEDICAL CARE IF:   You do not feel better in 24 hours or are getting worse.  You have side effects to medications, or are not getting better with treatment. SEEK IMMEDIATE MEDICAL CARE IF:   You have a fever.  You develop severe joint pain, swelling or redness.  Many joints are involved and become painful and swollen.  There is severe back pain and/or leg weakness.  You have loss of bowel or bladder control. Document Released: 02/02/2004 Document Revised: 03/19/2011 Document Reviewed: 02/18/2008 [facility] Patient Information 2015 [place], [place]. This information is not intended to replace advice given to you by your health care provider. Make sure you discuss any questions you have with your health care provider.  We have placed a referral for orthopedics for you, they will call you to make an appointment. We have cultured her prescription for [facility], do not take any other NSAIDs including Advil, Motrin and Aleve. Be very cautious while on [facility], considering your history of a GI bleed in the past.

## 2013-08-18 NOTE — Assessment & Plan Note (Addendum)
Taking hydrocodone for pain PRN.  Mobic prescribed with caution, h/o of GI bleed. Ortho referral placed, imaging studies deferred to orthopedic. Followup when necessary

## 2013-08-18 NOTE — Progress Notes (Signed)
   Subjective:    Patient ID: Harold Colon, male    DOB: Sep 06, 1955, 58 y.o.   MRN: [id number]  HPI Harold Colon is a 58 y.o. male presents to [facility]  Bilateral knee pain: Patient presents to same-day clinic with chronic bilateral knee pain. He was recently seen in urgent care for increased knee pain. He states the doctor there told him he needed or for referral for his arthritis versus meniscus injury. Patient states that the last few months his knees have become progressively painful. 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 He is to take Aleve daily for pain relief. Patient did have a history of GI bleed in 2012. He is now taken hydrocodone only as needed, for prescription he received in the urgent care. He has never seen an orthopedic doctor for this issue. He denies any new injury to his knee, erythema or swelling.  Review of Systems Per HPI     Objective:   Physical Exam BP 119/80  Pulse 91  Temp(Src) 98.4 F (36.9 C) (Oral)  Ht 6\' 1"  (1.854 m)  Wt 245 lb (111.131 kg)  BMI 32.33 kg/m2 Gen: Pleasant, cooperative [place], no acute distress, non toxic in appearance. Well-developed, well-nourished. Afebrile. Ext: No erythema. No edema. Mild effusion of the right knee. Mild to moderate crepitus of right knee in extension. Joint line tenderness, medial, bilaterally. Full range of motion bilaterally. Positive McMurray's bilaterally. Negative Lackman's. No ligament laxity. Neurovascularly intact distally.         Assessment & Plan:

## 2013-09-07 ENCOUNTER — Emergency Department (INDEPENDENT_AMBULATORY_CARE_PROVIDER_SITE_OTHER)
Admission: EM | Admit: 2013-09-07 | Discharge: 2013-09-07 | Disposition: A | Discharge status: Home / Self Care | Payer: Medicaid Other | Source: Home / Self Care | Attending: Family Medicine | Admitting: Family Medicine

## 2013-09-07 ENCOUNTER — Encounter (HOSPITAL_COMMUNITY): Payer: Self-pay | Admitting: Emergency Medicine

## 2013-09-07 DIAGNOSIS — L738 Other specified follicular disorders: Secondary | ICD-10-CM

## 2013-09-07 DIAGNOSIS — B372 Candidiasis of skin and nail: Secondary | ICD-10-CM

## 2013-09-07 DIAGNOSIS — L739 Follicular disorder, unspecified: Secondary | ICD-10-CM

## 2013-09-07 MED ORDER — DOXYCYCLINE HYCLATE 100 MG PO CAPS
100.0000 mg | ORAL_CAPSULE | Freq: Two times a day (BID) | ORAL | Status: DC
Start: 2013-09-07 — End: 2013-10-12

## 2013-09-07 MED ORDER — CLOTRIMAZOLE-BETAMETHASONE 1-0.05 % EX CREA: TOPICAL_CREAM | CUTANEOUS | Status: DC

## 2013-09-07 NOTE — Discharge Instructions (Signed)
Folliculitis  Folliculitis is redness, soreness, and swelling (inflammation) of the hair follicles. This condition can occur anywhere on the body. People with weakened immune systems, diabetes, or obesity have a greater risk of getting folliculitis. CAUSES  Bacterial infection. This is the most common cause.  Fungal infection.  Viral infection.  Contact with certain chemicals, especially oils and tars. Long-term folliculitis can result from bacteria that live in the nostrils. The bacteria may trigger multiple outbreaks of folliculitis over time. SYMPTOMS Folliculitis most commonly occurs on the scalp, thighs, legs, back, buttocks, and areas where hair is shaved frequently. An early sign of folliculitis is a small, white or yellow, pus-filled, itchy lesion (pustule). These lesions appear on a red, inflamed follicle. They are usually less than 0.2 inches (5 mm) wide. When there is an infection of the follicle that goes deeper, it becomes a boil or furuncle. A group of closely packed boils creates a larger lesion (carbuncle). Carbuncles tend to occur in hairy, sweaty areas of the body. DIAGNOSIS  Your caregiver can usually tell what is wrong by doing a physical exam. A sample may be taken from one of the lesions and tested in a lab. This can help determine what is causing your folliculitis. TREATMENT  Treatment may include:  Applying warm compresses to the affected areas.  Taking antibiotic medicines orally or applying them to the skin.  Draining the lesions if they contain a large amount of pus or fluid.  Laser hair removal for cases of long-lasting folliculitis. This helps to prevent regrowth of the hair. HOME CARE INSTRUCTIONS  Apply warm compresses to the affected areas as directed by your caregiver.  If antibiotics are prescribed, take them as directed. Finish them even if you start to feel better.  You may take over-the-counter medicines to relieve itching.  Do not shave  irritated skin.  Follow up with your caregiver as directed. SEEK IMMEDIATE MEDICAL CARE IF:   You have increasing redness, swelling, or pain in the affected area.  You have a fever. MAKE SURE YOU:  Understand these instructions.  Will watch your condition.  Will get help right away if you are not doing well or get worse. Document Released: 03/05/2001 Document Revised: 06/26/2011 Document Reviewed: 03/27/2011 [facility] Patient Information 2015 [place], [place]. This information is not intended to replace advice given to you by your health care provider. Make sure you discuss any questions you have with your health care provider.  Yeast Infection of the Skin Some yeast on the skin is normal, but sometimes it causes an infection. If you have a yeast infection, it shows up as white or light brown patches on brown skin. You can see it better in the summer on tan skin. It causes light-colored holes in your suntan. It can happen on any area of the body. This cannot be passed from person to person. HOME CARE  Scrub your skin daily with a dandruff shampoo. Your rash may take a couple weeks to get well.  Do not scratch or itch the rash. GET HELP RIGHT AWAY IF:   You get another infection from scratching. The skin may get warm, red, and may ooze fluid.  The infection does not seem to be getting better. MAKE SURE YOU:  Understand these instructions.  Will watch your condition.  Will get help right away if you are not doing well or get worse. Document Released: 12/08/2007 Document Revised: 03/19/2011 Document Reviewed: 12/08/2007 [facility] Patient Information 2015 [place], [place]. This information is not intended  to replace advice given to you by your health care provider. Make sure you discuss any questions you have with your health care provider. ° °

## 2013-09-07 NOTE — ED Provider Notes (Signed)
CSN: [id number]     Arrival date & time 09/07/13  1859 History   First MD Initiated Contact with Patient 09/07/13 1912     Chief Complaint  Patient presents with  . Skin Problem   (Consider location/radiation/quality/duration/timing/severity/associated sxs/prior Treatment) HPI Comments: 58 year old male presents complaining of a itchy, slightly painful red rash all over his back. He has a history of MRSA abscesses and he is worried he may have MRSA on his back. he first noticed this on his back about a week to go. He tried to wait for it to go away on its own but it has persisted. Also he has a history of candida intertrigo below his stomach, he says this has come back. Denies any pain associated with this, or drainage. He denies any systemic symptoms at this time. Denies any new exposures. Denies history of allergic reactions. Denies recent travel or sick contacts.   Past Medical History  Diagnosis Date  . Chest pain   . Cocaine abuse last use 2010  . Herpes   . MRSA (methicillin resistant Staphylococcus aureus)   . OSA (obstructive sleep apnea)   . Arthritis    Past Surgical History  Procedure Laterality Date  . Hand surgery Left 2009    ring finger- tendon repair  . Esophagogastroduodenoscopy  06/10/2011    Procedure: ESOPHAGOGASTRODUODENOSCOPY (EGD);  Surgeon: Shirley Friar, MD;  Location: [facility] ENDOSCOPY;  Service: Endoscopy;  Laterality: N/A;  . Colonoscopy  06/10/2011    Procedure: COLONOSCOPY;  Surgeon: Shirley Friar, MD;  Location: [facility] ENDOSCOPY;  Service: Endoscopy;  Laterality: N/A;   Family History  Problem Relation Age of Onset  . Diabetes    . Heart failure Mother   . Diabetes Mother   . Cancer Father     colon   History  Substance Use Topics  . Smoking status: Current Every Day Smoker -- 1.00 packs/day    Types: Cigarettes  . Smokeless tobacco: Not on file  . Alcohol Use: Yes     Comment: occasional    Review of Systems  Constitutional: Negative for  fever and chills.  Skin: Positive for rash (see HPI).  All other systems reviewed and are negative.   Allergies  Review of patient's allergies indicates no known allergies.  Home Medications   Prior to Admission medications   Medication Sig Start Date End Date Taking? Authorizing Provider  chlorhexidine (HIBICLENS) 4 % external liquid Apply 60 mLs (4 application total) topically daily as needed. 06/12/12   Reuben Likes, MD  clotrimazole-betamethasone (LOTRISONE) cream Apply to affected area 2 times daily prn 10/07/12   Hayden Rasmussen, NP  clotrimazole-betamethasone (LOTRISONE) cream Apply to affected area 2 times daily prn 09/07/13   Graylon Good, PA-C  doxycycline (VIBRAMYCIN) 100 MG capsule Take 1 capsule (100 mg total) by mouth 2 (two) times daily. 09/07/13   Graylon Good, PA-C  HYDROcodone-acetaminophen (NORCO) 5-325 MG per tablet Take 1 tablet by mouth every 6 (six) hours as needed for moderate pain. 08/05/13   Graylon Good, PA-C  HYDROcodone-acetaminophen (NORCO/VICODIN) 5-325 MG per tablet Take 2 tablets by mouth every 4 (four) hours as needed for moderate pain. 02/04/13   Elson Areas, PA-C  ibuprofen (ADVIL,MOTRIN) 800 MG tablet Take 1 tablet (800 mg total) by mouth 3 (three) times daily. 02/04/13   Elson Areas, PA-C  meloxicam (MOBIC) 15 MG tablet Take 1 tablet (15 mg total) by mouth daily. 08/18/13   Renee A Kuneff, DO  mupirocin ointment (BACTROBAN) 2 % Apply topically 3 (three) times daily. 06/12/12   Reuben Likes, MD  naproxen (NAPROSYN) 500 MG tablet Take 1 tablet (500 mg total) by mouth 2 (two) times daily. 08/04/12   Graylon Good, PA-C  naproxen (NAPROSYN) 500 MG tablet Take 1 tablet (500 mg total) by mouth 2 (two) times daily. 08/05/13   Adrian Blackwater Daxen Lanum, PA-C  nystatin (MYCOSTATIN) powder Apply topically 4 (four) times daily. 10/07/12   Hayden Rasmussen, NP  nystatin-triamcinolone [facility]) cream Apply to affected area twice daily 08/04/12   Graylon Good, PA-C  rifampin  (RIFADIN) 300 MG capsule Take 1 capsule (300 mg total) by mouth 2 (two) times daily. 06/12/12   Reuben Likes, MD   BP 124/82  Pulse 70  Temp(Src) 98.6 F (37 C) (Oral)  Resp 18  SpO2 98% Physical Exam  Nursing note and vitals reviewed. Constitutional: He is oriented to person, place, and time. He appears well-developed and well-nourished. No distress.  HENT:  Head: Normocephalic.  Pulmonary/Chest: Effort normal. No respiratory distress.  Neurological: He is alert and oriented to person, place, and time. Coordination normal.  Skin: Skin is warm and dry. Rash (Below the pannus on the stomach the skin is slightly thickened hyperpigmented with a very slight scale) noted. Rash is pustular (pustules on an erythematous base all over the back, symmetric.). He is not diaphoretic.  Psychiatric: He has a normal mood and affect. Judgment normal.    ED Course  Procedures (including critical care time) Labs Review Labs Reviewed - No data to display  Imaging Review No results found.   MDM   1. Folliculitis   2. Candidal intertrigo    Treat the folliculitis with doxycycline, and Lotrisone for the candida intertrigo. He will   keep it clean and dry. He will use Burow solution soaks twice daily, or he may substitute 1:4 white vinegar to water instead of the Burow solution if he would like. Followup as needed   Meds ordered this encounter  Medications  . doxycycline (VIBRAMYCIN) 100 MG capsule    Sig: Take 1 capsule (100 mg total) by mouth 2 (two) times daily.    Dispense:  28 capsule    Refill:  0    Order Specific Question:  Supervising Provider    Answer:  Lorenz Coaster, DAVID C [id number]  . clotrimazole-betamethasone (LOTRISONE) cream    Sig: Apply to affected area 2 times daily prn    Dispense:  45 g    Refill:  0    Order Specific Question:  Supervising Provider    Answer:  Lorenz Coaster, DAVID C [[id number]]      Graylon Good, PA-C 09/07/13 1936

## 2013-09-07 NOTE — ED Notes (Signed)
C/o rash on back , reported MRSA history

## 2013-09-08 NOTE — ED Provider Notes (Signed)
Medical screening examination/treatment/procedure(s) were performed by resident physician or non-physician practitioner and as supervising physician I was immediately available for consultation/collaboration.   Maanya Hippert DOUGLAS MD.   Tosha Belgarde D Lenord Fralix, MD 09/08/13 1231 

## 2013-09-17 ENCOUNTER — Encounter: Payer: Self-pay | Admitting: Family Medicine

## 2013-09-17 NOTE — Progress Notes (Signed)
Pt came in stating that he hasn't heard anything regarding referral for a specialist concerning his knee, contact number [phone].

## 2013-09-18 NOTE — Progress Notes (Signed)
Spoke with male, she stated that patient was not around at moment. She will have patient calls [place] back. Informed her that we close early today and if we cannot get in touch with patient today we will follow up with patient Monday.  ~~~Looks like referral was done as [employer], does patient still have orange card or is his Medicaid valid.

## 2013-09-21 ENCOUNTER — Encounter: Payer: Self-pay | Admitting: Family Medicine

## 2013-09-21 NOTE — Progress Notes (Signed)
Pt dropped off paperwork to be completed regarding disability and can be reached at [phone] or [phone].

## 2013-09-25 NOTE — Progress Notes (Signed)
Needs to be on letterhead.  Will have to be scanned.  Will have this done on MOnday.

## 2013-09-30 NOTE — Progress Notes (Signed)
I put this on Harold Colon's desk.  She is on vacation but she will need to scan the letter onto our letterhead.  Will likely not be ready until next week.

## 2013-10-07 NOTE — Progress Notes (Signed)
Pt informed that letter is completed.  [facility]  Letters copied for scanning in patient's record.  Clovis PuMartin, Ursula Dermody L, RN

## 2013-10-12 ENCOUNTER — Emergency Department (HOSPITAL_COMMUNITY): Payer: Medicaid Other

## 2013-10-12 ENCOUNTER — Encounter: Payer: Self-pay | Admitting: *Deleted

## 2013-10-12 ENCOUNTER — Emergency Department (HOSPITAL_COMMUNITY)
Admission: EM | Admit: 2013-10-12 | Discharge: 2013-10-12 | Disposition: A | Discharge status: Home / Self Care | Payer: Medicaid Other | Attending: Emergency Medicine | Admitting: Emergency Medicine

## 2013-10-12 ENCOUNTER — Encounter (HOSPITAL_COMMUNITY): Payer: Self-pay | Admitting: Emergency Medicine

## 2013-10-12 DIAGNOSIS — Z8614 Personal history of Methicillin resistant Staphylococcus aureus infection: Secondary | ICD-10-CM | POA: Diagnosis not present

## 2013-10-12 DIAGNOSIS — Z791 Long term (current) use of non-steroidal anti-inflammatories (NSAID): Secondary | ICD-10-CM | POA: Diagnosis not present

## 2013-10-12 DIAGNOSIS — Z8659 Personal history of other mental and behavioral disorders: Secondary | ICD-10-CM | POA: Insufficient documentation

## 2013-10-12 DIAGNOSIS — E669 Obesity, unspecified: Secondary | ICD-10-CM | POA: Diagnosis not present

## 2013-10-12 DIAGNOSIS — M1990 Unspecified osteoarthritis, unspecified site: Secondary | ICD-10-CM | POA: Insufficient documentation

## 2013-10-12 DIAGNOSIS — Z8619 Personal history of other infectious and parasitic diseases: Secondary | ICD-10-CM | POA: Diagnosis not present

## 2013-10-12 DIAGNOSIS — R079 Chest pain, unspecified: Secondary | ICD-10-CM | POA: Diagnosis present

## 2013-10-12 DIAGNOSIS — R0789 Other chest pain: Secondary | ICD-10-CM | POA: Insufficient documentation

## 2013-10-12 DIAGNOSIS — Z7982 Long term (current) use of aspirin: Secondary | ICD-10-CM | POA: Insufficient documentation

## 2013-10-12 DIAGNOSIS — Z792 Long term (current) use of antibiotics: Secondary | ICD-10-CM | POA: Insufficient documentation

## 2013-10-12 DIAGNOSIS — Z720 Tobacco use: Secondary | ICD-10-CM | POA: Insufficient documentation

## 2013-10-12 DIAGNOSIS — Z79899 Other long term (current) drug therapy: Secondary | ICD-10-CM | POA: Insufficient documentation

## 2013-10-12 DIAGNOSIS — Z8669 Personal history of other diseases of the nervous system and sense organs: Secondary | ICD-10-CM | POA: Diagnosis not present

## 2013-10-12 LAB — CBC
HEMATOCRIT: 43.7 % (ref 39.0–52.0)
Hemoglobin: 15 g/dL (ref 13.0–17.0)
MCH: 31.3 pg (ref 26.0–34.0)
MCHC: 34.3 g/dL (ref 30.0–36.0)
MCV: 91 fL (ref 78.0–100.0)
Platelets: 188 10*3/uL (ref 150–400)
RBC: 4.8 MIL/uL (ref 4.22–5.81)
RDW: 13.1 % (ref 11.5–15.5)
WBC: 6.4 10*3/uL (ref 4.0–10.5)

## 2013-10-12 LAB — BASIC METABOLIC PANEL
Anion gap: 9 (ref 5–15)
BUN: 10 mg/dL (ref 6–23)
CO2: 30 mEq/L (ref 19–32)
CREATININE: 0.78 mg/dL (ref 0.50–1.35)
Calcium: 9.2 mg/dL (ref 8.4–10.5)
Chloride: 103 mEq/L (ref 96–112)
Glucose, Bld: 112 mg/dL — ABNORMAL HIGH (ref 70–99)
Potassium: 4 mEq/L (ref 3.7–5.3)
Sodium: 142 mEq/L (ref 137–147)

## 2013-10-12 LAB — I-STAT TROPONIN, ED: Troponin i, poc: 0 ng/mL (ref 0.00–0.08)

## 2013-10-12 MED ORDER — MELOXICAM 15 MG PO TABS
15.0000 mg | ORAL_TABLET | Freq: Every day | ORAL | Status: DC | PRN
Start: 2013-10-12 — End: 2014-01-19

## 2013-10-12 MED ORDER — ASPIRIN 325 MG PO TABS
325.0000 mg | ORAL_TABLET | Freq: Once | ORAL | Status: AC
  Administered 2013-10-12: 325 mg via ORAL
  Filled 2013-10-12: qty 1

## 2013-10-12 MED ORDER — IBUPROFEN 800 MG PO TABS: 800.0000 mg | ORAL_TABLET | Freq: Three times a day (TID) | ORAL | Status: DC | PRN

## 2013-10-12 MED ORDER — OXYCODONE-ACETAMINOPHEN 5-325 MG PO TABS
2.0000 | ORAL_TABLET | Freq: Once | ORAL | Status: AC
  Administered 2013-10-12: 2 via ORAL
  Filled 2013-10-12: qty 2

## 2013-10-12 NOTE — ED Notes (Signed)
Pt reports intermittent central chest "squeezing" since last night. Worse with movement, and last night was worse with swallowing and movement of neck. Denies SOB, N/V, dizziness or diaphoresis. Pt denies pain at this time. NAD.

## 2013-10-12 NOTE — ED Provider Notes (Signed)
CSN: [id number]     Arrival date & time 10/12/13  1239 History   First MD Initiated Contact with Patient 10/12/13 1414     Chief Complaint  Patient presents with  . Chest Pain     (Consider location/radiation/quality/duration/timing/severity/associated sxs/prior Treatment) HPI Pt is a 58yo male with hx of cocaine abuse, OSA, and chest pain presenting to ED with c/o chest pain since last night, gradually improving.  Pt states when pain was at its worst it was 8/10 felt like a "squeezing" sensation. Triage note states pain has been intermittent, however, pt states he has been constant but gradually improving, worse with certain movements of his neck and arms. States it feels like the pain is coming from the bones and muscles in his neck.  Denies any injury or heavy lifting.  Pt states pain is also worse with swallowing at times but feels different than previous acid reflux. Denies fever, n/v/d, SOB, diaphoresis or dizziness. No pain medication taken PTA. Does report being admitted about 1 year ago and had a normal stress test.  Denies recent use of cocaine. Denies previous MI.     Past Medical History  Diagnosis Date  . Chest pain   . Cocaine abuse last use 2010  . Herpes   . MRSA (methicillin resistant Staphylococcus aureus)   . OSA (obstructive sleep apnea)   . Arthritis    Past Surgical History  Procedure Laterality Date  . Hand surgery Left 2009    ring finger- tendon repair  . Esophagogastroduodenoscopy  06/10/2011    Procedure: ESOPHAGOGASTRODUODENOSCOPY (EGD);  Surgeon: Shirley Friar, MD;  Location: [facility] ENDOSCOPY;  Service: Endoscopy;  Laterality: N/A;  . Colonoscopy  06/10/2011    Procedure: COLONOSCOPY;  Surgeon: Shirley Friar, MD;  Location: [facility] ENDOSCOPY;  Service: Endoscopy;  Laterality: N/A;   Family History  Problem Relation Age of Onset  . Diabetes    . Heart failure Mother   . Diabetes Mother   . Cancer Father     colon   History  Substance Use Topics  .  Smoking status: Current Every Day Smoker -- 1.00 packs/day    Types: Cigarettes  . Smokeless tobacco: Not on file  . Alcohol Use: Yes     Comment: occasional    Review of Systems  Constitutional: Negative for fever, chills and fatigue.  Respiratory: Negative for cough and shortness of breath.   Cardiovascular: Positive for chest pain. Negative for palpitations and leg swelling.  Gastrointestinal: Negative for nausea, vomiting, abdominal pain and diarrhea.  Neurological: Negative for seizures, weakness and numbness.  All other systems reviewed and are negative.     Allergies  Review of patient's allergies indicates no known allergies.  Home Medications   Prior to Admission medications   Medication Sig Start Date End Date Taking? Authorizing Provider  aspirin EC 81 MG tablet Take 81 mg by mouth daily.   Yes Historical Provider, MD  buPROPion (WELLBUTRIN XL) 150 MG 24 hr tablet Take 150 mg by mouth daily.   Yes Historical Provider, MD  clotrimazole-betamethasone (LOTRISONE) cream Apply 1 application topically 2 (two) times daily as needed (for rash).   Yes Historical Provider, MD  naproxen (NAPROSYN) 500 MG tablet Take 500 mg by mouth daily.   Yes Historical Provider, MD  ibuprofen (ADVIL,MOTRIN) 800 MG tablet Take 1 tablet (800 mg total) by mouth 3 (three) times daily as needed for headache or moderate pain. 10/12/13   Junius Finner, PA-C  meloxicam (MOBIC) 15 MG  tablet Take 1 tablet (15 mg total) by mouth daily as needed for pain. 10/12/13   Junius Finner, PA-C   BP 134/97  Pulse 67  Temp(Src) 98.3 F (36.8 C)  Resp 13  SpO2 100% Physical Exam  Nursing note and vitals reviewed. Constitutional: He appears well-developed and well-nourished.  Pt is an obese male lying comfortably in exam bed, NAD.    HENT:  Head: Normocephalic and atraumatic.  Eyes: Conjunctivae are normal. No scleral icterus.  Neck: Normal range of motion. Neck supple.  No nuchal rigidity or meningeal signs.   Cardiovascular: Normal rate, regular rhythm and normal heart sounds.   Pulmonary/Chest: Effort normal and breath sounds normal. No respiratory distress. He has no wheezes. He has no rales. He exhibits tenderness ( mild tenderness to upper anterior chest wall).  No respiratory distress, able to speak in full sentences w/o difficulty. Lungs: mildly decreased breath sounds in Left lower lung field, otherwise clear to auscultation.   Abdominal: Soft. Bowel sounds are normal. He exhibits no distension and no mass. There is no tenderness. There is no rebound and no guarding.  Musculoskeletal: Normal range of motion. He exhibits tenderness. He exhibits no edema.  Tenderness over upper thoracic paraspinal muscles as well as upper left and right trapezius muscles.   Neurological: He is alert.  Skin: Skin is warm and dry.    ED Course  Procedures (including critical care time) Labs Review Labs Reviewed  BASIC METABOLIC PANEL - Abnormal; Notable for the following:    Glucose, Bld 112 (*)    All other components within normal limits  CBC  I-STAT TROPOININ, ED    Imaging Review Dg Chest 2 View  10/12/2013   CLINICAL DATA:  Sharp chest pain beginning today.  EXAM: CHEST  2 VIEW  COMPARISON:  None.  FINDINGS: Minimal atelectasis is seen the left lung base. Lungs otherwise clear. No pneumothorax. There is a trace left pleural effusion. Heart size is normal.  IMPRESSION: Trace left pleural effusion and mild basilar atelectasis.   Electronically Signed   By: Drusilla Kanner M.D.   On: 10/12/2013 14:04     EKG Interpretation   Date/Time:  Monday October 12 2013 12:48:22 EDT Ventricular Rate:  74 PR Interval:  154 QRS Duration: 80 QT Interval:  356 QTC Calculation: 395 R Axis:   6 Text Interpretation:  Normal sinus rhythm Nonspecific ST abnormality No  significant change since last tracing Confirmed by HORTON  MD, Toni Amend  ([id number]) on 10/12/2013 2:18:02 PM      MDM   Final diagnoses:   Other chest pain  Chest wall pain   Pt is a 58yo male presenting to ED with atypical chest pain that has been constant but gradually improving since onset last night. Pt is low risk for ACS based on HEART score. Initial cardiac workup unremarkable.  Pt states pain has improved while being in ED however did request pain medication.  Pt given aspirin and percocet.  Not concerned for PE. PERC negative.  CXR: trace left pleural effusion, mild basilar atelectasis, however, O2-100% on RA, no respiratory distress. Not concerned for pneumonia as pt is afebrile, no cough, nausea or vomiting. Discussed pt with Dr. Wilkie Aye, will tx for musculoskeletal vs GERD.  Offered pt GI cocktail, pt declined stating "i just think its my muscles and bones." will discharge pt home to f/u with PCP and cardiology for recheck of symptoms as needed. Return precautions provided. Pt verbalized understanding and agreement with tx plan.  Junius FinnerErin O'Malley, PA-C 10/12/13 1529

## 2013-10-12 NOTE — Progress Notes (Signed)
   Pt in nurse clinic with complaints of chest pain.  Pt stated chest pain stated last night 10/11/2013.  Pain starts at start neck down to center of chest to back.  Pt denies any SOB, dizziness, headache, visual changes or numbness/tingling of extremities.  Precepted with Dr. Gwendolyn GrantWalden; take pt to ED. Pt escorted to ED by triage nurse.  Clovis PuMartin, Urie Loughner L, RN

## 2013-10-12 NOTE — ED Provider Notes (Signed)
Medical screening examination/treatment/procedure(s) were performed by non-physician practitioner and as supervising physician I was immediately available for consultation/collaboration.   EKG Interpretation   Date/Time:  Monday October 12 2013 12:48:22 EDT Ventricular Rate:  74 PR Interval:  154 QRS Duration: 80 QT Interval:  356 QTC Calculation: 395 R Axis:   6 Text Interpretation:  Normal sinus rhythm Nonspecific ST abnormality No  significant change since last tracing Confirmed by Wilkie AyeHORTON  MD, Bland Rudzinski  ([id number]) on 10/12/2013 2:18:02 PM        Shon Batonourtney F Navarre Diana, MD 10/12/13 [phone]

## 2013-10-12 NOTE — Progress Notes (Signed)
I have TWO patients named Eduardo OsierMichael Mcdill.  I was under the impression that this letter was for my regularly followed Eduardo OsierMichael Aderman.  However, this letter is for a Mr. Liddy whom I have not seen since 2013.    Jimmy FootmanSara Evans brought me the letter today to sign.  However, since I have not seen him since 2013, I will be unable to complete the letter until I see him in person and discuss his issues.  Huntley DecSara is aware of this.  The patient was currently taken over to the ED for chest pain.  He will need to follow-up with me in the following weeks.

## 2013-10-12 NOTE — Discharge Instructions (Signed)
Chest Pain (Nonspecific) °It is often hard to give a diagnosis for the cause of chest pain. There is always a chance that your pain could be related to something serious, such as a heart attack or a blood clot in the lungs. You need to follow up with your doctor. °HOME CARE °· If antibiotic medicine was given, take it as directed by your doctor. Finish the medicine even if you start to feel better. °· For the next few days, avoid activities that bring on chest pain. Continue physical activities as told by your doctor. °· Do not use any tobacco products. This includes cigarettes, chewing tobacco, and e-cigarettes. °· Avoid drinking alcohol. °· Only take medicine as told by your doctor. °· Follow your doctor's suggestions for more testing if your chest pain does not go away. °· Keep all doctor visits you made. °GET HELP IF: °· Your chest pain does not go away, even after treatment. °· You have a rash with blisters on your chest. °· You have a fever. °GET HELP RIGHT AWAY IF:  °· You have more pain or pain that spreads to your arm, neck, jaw, back, or belly (abdomen). °· You have shortness of breath. °· You cough more than usual or cough up blood. °· You have very bad back or belly pain. °· You feel sick to your stomach (nauseous) or throw up (vomit). °· You have very bad weakness. °· You pass out (faint). °· You have chills. °This is an emergency. Do not wait to see if the problems will go away. Call your local emergency services (911 in [place] Do not drive yourself to the hospital. °MAKE SURE YOU:  °· Understand these instructions. °· Will watch your condition. °· Will get help right away if you are not doing well or get worse. °Document Released: 06/13/2007 Document Revised: 12/30/2012 Document Reviewed: 06/13/2007 °ExitCare® Patient Information ©2015 [employer]. This information is not intended to replace advice given to you by your health care provider. Make sure you discuss any questions you have with your  health care provider. ° °Chest Wall Pain °Chest wall pain is pain in or around the bones and muscles of your chest. It may take up to 6 weeks to get better. It may take longer if you must stay physically active in your work and activities.  °CAUSES  °Chest wall pain may happen on its own. However, it may be caused by: °· A viral illness like the flu. °· Injury. °· Coughing. °· Exercise. °· Arthritis. °· Fibromyalgia. °· Shingles. °HOME CARE INSTRUCTIONS  °· Avoid overtiring physical activity. Try not to strain or perform activities that cause pain. This includes any activities using your chest or your abdominal and side muscles, especially if heavy weights are used. °· Put ice on the sore area. °¨ Put ice in a plastic bag. °¨ Place a towel between your skin and the bag. °¨ Leave the ice on for 15-20 minutes per hour while awake for the first 2 days. °· Only take over-the-counter or prescription medicines for pain, discomfort, or fever as directed by your caregiver. °SEEK IMMEDIATE MEDICAL CARE IF:  °· Your pain increases, or you are very uncomfortable. °· You have a fever. °· Your chest pain becomes worse. °· You have new, unexplained symptoms. °· You have nausea or vomiting. °· You feel sweaty or lightheaded. °· You have a cough with phlegm (sputum), or you cough up blood. °MAKE SURE YOU:  °· Understand these instructions. °· Will watch your   condition. °· Will get help right away if you are not doing well or get worse. °Document Released: 12/25/2004 Document Revised: 03/19/2011 Document Reviewed: 08/21/2010 °ExitCare® Patient Information ©2015 [employer]. This information is not intended to replace advice given to you by your health care provider. Make sure you discuss any questions you have with your health care provider. ° °

## 2013-10-21 ENCOUNTER — Encounter (HOSPITAL_COMMUNITY): Payer: Self-pay | Admitting: Emergency Medicine

## 2013-10-21 ENCOUNTER — Emergency Department (INDEPENDENT_AMBULATORY_CARE_PROVIDER_SITE_OTHER)
Admission: EM | Admit: 2013-10-21 | Discharge: 2013-10-21 | Disposition: A | Discharge status: Home / Self Care | Payer: Medicaid Other | Source: Home / Self Care | Attending: Family Medicine | Admitting: Family Medicine

## 2013-10-21 DIAGNOSIS — Z202 Contact with and (suspected) exposure to infections with a predominantly sexual mode of transmission: Secondary | ICD-10-CM

## 2013-10-21 DIAGNOSIS — Z711 Person with feared health complaint in whom no diagnosis is made: Secondary | ICD-10-CM

## 2013-10-21 MED ORDER — METRONIDAZOLE 500 MG PO TABS: 500.0000 mg | ORAL_TABLET | Freq: Two times a day (BID) | ORAL | Status: DC

## 2013-10-21 NOTE — Discharge Instructions (Signed)
Take all of medicine as prescribed, use condoms to protect against further exposure to BV problem

## 2013-10-21 NOTE — ED Provider Notes (Signed)
CSN: [id number]     Arrival date & time 10/21/13  1808 History   First MD Initiated Contact with Patient 10/21/13 1859     Chief Complaint  Patient presents with  . Exposure to STD   (Consider location/radiation/quality/duration/timing/severity/associated sxs/prior Treatment) Patient is a 58 y.o. male presenting with STD exposure. The history is provided by the patient.  Exposure to STD This is a new problem. The current episode started 3 to 5 hours ago (male partner advised pt today she has BV and he should be treated, since having unprotected sex.).    Past Medical History  Diagnosis Date  . Chest pain   . Cocaine abuse last use 2010  . Herpes   . MRSA (methicillin resistant Staphylococcus aureus)   . OSA (obstructive sleep apnea)   . Arthritis    Past Surgical History  Procedure Laterality Date  . Hand surgery Left 2009    ring finger- tendon repair  . Esophagogastroduodenoscopy  06/10/2011    Procedure: ESOPHAGOGASTRODUODENOSCOPY (EGD);  Surgeon: Shirley FriarVincent C. Schooler, MD;  [facility];  Service: Endoscopy;  Laterality: N/A;  . Colonoscopy  06/10/2011    Procedure: COLONOSCOPY;  Surgeon: Shirley FriarVincent C. Schooler, MD;  [facility];  Service: Endoscopy;  Laterality: N/A;   Family History  Problem Relation Age of Onset  . Diabetes    . Heart failure Mother   . Diabetes Mother   . Cancer Father     colon   History  Substance Use Topics  . Smoking status: Current Every Day Smoker -- 1.00 packs/day    Types: Cigarettes  . Smokeless tobacco: Not on file  . Alcohol Use: Yes     Comment: occasional    Review of Systems  Constitutional: Negative.   Genitourinary: Negative.  Negative for discharge.    Allergies  Review of patient's allergies indicates no known allergies.  Home Medications   Prior to Admission medications   Medication Sig Start Date End Date Taking? Authorizing Provider  aspirin EC 81 MG tablet Take 81 mg by mouth daily.    Historical  Provider, MD  buPROPion (WELLBUTRIN XL) 150 MG 24 hr tablet Take 150 mg by mouth daily.    Historical Provider, MD  clotrimazole-betamethasone (LOTRISONE) cream Apply 1 application topically 2 (two) times daily as needed (for rash).    Historical Provider, MD  ibuprofen (ADVIL,MOTRIN) 800 MG tablet Take 1 tablet (800 mg total) by mouth 3 (three) times daily as needed for headache or moderate pain. 10/12/13   Junius FinnerErin O'Malley, PA-C  meloxicam (MOBIC) 15 MG tablet Take 1 tablet (15 mg total) by mouth daily as needed for pain. 10/12/13   Junius FinnerErin O'Malley, PA-C  metroNIDAZOLE (FLAGYL) 500 MG tablet Take 1 tablet (500 mg total) by mouth 2 (two) times daily. 10/21/13   Linna HoffJames D Kindl, MD  naproxen (NAPROSYN) 500 MG tablet Take 500 mg by mouth daily.    Historical Provider, MD   BP 117/79  Pulse 73  Temp(Src) 97.4 F (36.3 C) (Oral)  Resp 16  Ht 6\' 1"  (1.854 m)  Wt 246 lb (111.585 kg)  BMI 32.46 kg/m2  SpO2 95% Physical Exam  Nursing note and vitals reviewed. Constitutional: He is oriented to person, place, and time. He appears well-developed and well-nourished.  Genitourinary: Penis normal. No penile tenderness.  Neurological: He is alert and oriented to person, place, and time.  Skin: Skin is warm and dry.    ED Course  Procedures (including critical care time) Labs Review  Labs Reviewed - No data to display  Imaging Review No results found.   MDM   1. Concern about STD in male without diagnosis        Linna HoffJames D Kindl, MD 10/21/13 [phone]

## 2013-10-21 NOTE — ED Notes (Signed)
Pt would like to be screened for STD He is asymptomatic Alert, no signs of acute distress.

## 2013-11-03 ENCOUNTER — Ambulatory Visit: Payer: Medicaid Other | Admitting: Family Medicine

## 2013-11-11 ENCOUNTER — Emergency Department (HOSPITAL_COMMUNITY)
Admission: EM | Admit: 2013-11-11 | Discharge: 2013-11-11 | Disposition: A | Discharge status: Home / Self Care | Payer: Medicaid Other | Source: Home / Self Care | Attending: Family Medicine | Admitting: Family Medicine

## 2013-11-11 ENCOUNTER — Encounter (HOSPITAL_COMMUNITY): Payer: Self-pay | Admitting: Emergency Medicine

## 2013-11-11 DIAGNOSIS — B354 Tinea corporis: Secondary | ICD-10-CM

## 2013-11-11 MED ORDER — TERBINAFINE HCL 250 MG PO TABS: 250.0000 mg | ORAL_TABLET | Freq: Every day | ORAL | Status: DC

## 2013-11-11 NOTE — ED Provider Notes (Signed)
Harold Colon is a 58 y.o. male who presents to Urgent Care today for Rash. Patient has multiple small itchy palpable plaques on his body that has been present for 5 months or so. It initially started with a circular lesion on the lateral aspect of his left foot and has spread across his entire body. He denies any pain fevers or chills nausea vomiting or diarrhea. It is slightly itchy. He has not tried any medications.   Past Medical History  Diagnosis Date  . Chest pain   . Cocaine abuse last use 2010  . Herpes   . MRSA (methicillin resistant Staphylococcus aureus)   . OSA (obstructive sleep apnea)   . Arthritis    Past Surgical History  Procedure Laterality Date  . Hand surgery Left 2009    ring finger- tendon repair  . Esophagogastroduodenoscopy  06/10/2011    Procedure: ESOPHAGOGASTRODUODENOSCOPY (EGD);  Surgeon: Shirley FriarVincent C. Schooler, MD;  [facility];  Service: Endoscopy;  Laterality: N/A;  . Colonoscopy  06/10/2011    Procedure: COLONOSCOPY;  Surgeon: Shirley FriarVincent C. Schooler, MD;  [facility];  Service: Endoscopy;  Laterality: N/A;   History  Substance Use Topics  . Smoking status: Current Every Day Smoker -- 1.00 packs/day    Types: Cigarettes  . Smokeless tobacco: Not on file  . Alcohol Use: Yes     Comment: occasional   ROS as above Medications: No current facility-administered medications for this encounter.   Current Outpatient Prescriptions  Medication Sig Dispense Refill  . aspirin EC 81 MG tablet Take 81 mg by mouth daily.    [name]. ibuprofen (ADVIL,MOTRIN) 800 MG tablet Take 1 tablet (800 mg total) by mouth 3 (three) times daily as needed for headache or moderate pain. 30 tablet 0  . buPROPion (WELLBUTRIN XL) 150 MG 24 hr tablet Take 150 mg by mouth daily.    . clotrimazole-betamethasone (LOTRISONE) cream Apply 1 application topically 2 (two) times daily as needed (for rash).    . meloxicam (MOBIC) 15 MG tablet Take 1 tablet (15 mg total) by mouth daily  as needed for pain. 30 tablet 0  . metroNIDAZOLE (FLAGYL) 500 MG tablet Take 1 tablet (500 mg total) by mouth 2 (two) times daily. 14 tablet 0  . naproxen (NAPROSYN) 500 MG tablet Take 500 mg by mouth daily.    [name]. terbinafine (LAMISIL) 250 MG tablet Take 1 tablet (250 mg total) by mouth daily. 7 tablet 0   No Known Allergies   Exam:  BP 127/85 mmHg  Pulse 69  Temp(Src) 98.3 F (36.8 C) (Oral)  Resp 16  SpO2 98% Gen: Well NAD Skin: multiple small less than 1 cm scaly hyperpigmented papules/plaques scattered across body. Large 2-3 cm area one on his lateral left foot. Nontender.some areas are scaling and annular.  No results found for this or any previous visit (from the past 24 hour(s)). No results found.  Assessment and Plan: 58 y.o. male with likely tinea corporis. Treatment with terbinafine. Follow-up with PCP if not better.  Discussed warning signs or symptoms. Please see discharge instructions. Patient expresses understanding.     Rodolph BongEvan S Darby Fleeman, MD 11/11/13 1314

## 2013-11-11 NOTE — ED Notes (Signed)
C/o rash on bilateral foot onset 5 months Denies fevers, chills Steady gait; NAD Alert, no signs of acute distress.

## 2013-11-11 NOTE — Discharge Instructions (Signed)
Thank you for coming in today. Take terbinafine daily for one week Follow-up with primary care provider if not getting better   Body Ringworm Ringworm (tinea corporis) is a fungal infection of the skin on the body. This infection is not caused by worms, but is actually caused by a fungus. Fungus normally lives on the top of your skin and can be useful. However, in the case of ringworms, the fungus grows out of control and causes a skin infection. It can involve any area of skin on the body and can spread easily from one person to another (contagious). Ringworm is a common problem for children, but it can affect adults as well. Ringworm is also often found in athletes, especially wrestlers who share equipment and mats.  CAUSES  Ringworm of the body is caused by a fungus called dermatophyte. It can spread by:  Touchingother people who are infected.  Touchinginfected pets.  Touching or sharingobjects that have been in contact with the infected person or pet (hats, combs, towels, clothing, sports equipment). SYMPTOMS   Itchy, raised red spots and bumps on the skin.  Ring-shaped rash.  Redness near the border of the rash with a clear center.  Dry and scaly skin on or around the rash. Not every person develops a ring-shaped rash. Some develop only the red, scaly patches. DIAGNOSIS  Most often, ringworm can be diagnosed by performing a skin exam. Your caregiver may choose to take a skin scraping from the affected area. The sample will be examined under the microscope to see if the fungus is present.  TREATMENT  Body ringworm may be treated with a topical antifungal cream or ointment. Sometimes, an antifungal shampoo that can be used on your body is prescribed. You may be prescribed antifungal medicines to take by mouth if your ringworm is severe, keeps coming back, or lasts a long time.  HOME CARE INSTRUCTIONS   Only take over-the-counter or prescription medicines as directed by your  caregiver.  Wash the infected area and dry it completely before applying yourcream or ointment.  When using antifungal shampoo to treat the ringworm, leave the shampoo on the body for 3-5 minutes before rinsing.   Wear loose clothing to stop clothes from rubbing and irritating the rash.  Wash or change your bed sheets every night while you have the rash.  Have your pet treated by your veterinarian if it has the same infection. To prevent ringworm:   Practice good hygiene.  Wear sandals or shoes in public places and showers.  Do not share personal items with others.  Avoid touching red patches of skin on other people.  Avoid touching pets that have bald spots or wash your hands after doing so. SEEK MEDICAL CARE IF:   Your rash continues to spread after 7 days of treatment.  Your rash is not gone in 4 weeks.  The area around your rash becomes red, warm, tender, and swollen. Document Released: 12/23/1999 Document Revised: 09/19/2011 Document Reviewed: 07/09/2011 [facility] Patient Information 2015 [place] [employer] [place]. This information is not intended to replace advice given to you by your health care provider. Make sure you discuss any questions you have with your health care provider.

## 2013-11-13 ENCOUNTER — Encounter: Payer: Self-pay | Admitting: Family Medicine

## 2013-11-13 ENCOUNTER — Ambulatory Visit (INDEPENDENT_AMBULATORY_CARE_PROVIDER_SITE_OTHER): Payer: Medicaid Other | Admitting: Family Medicine

## 2013-11-13 VITALS — BP 128/84 | HR 79 | Ht 73.0 in | Wt 246.7 lb

## 2013-11-13 DIAGNOSIS — M25562 Pain in left knee: Secondary | ICD-10-CM

## 2013-11-13 DIAGNOSIS — B353 Tinea pedis: Secondary | ICD-10-CM

## 2013-11-13 DIAGNOSIS — B49 Unspecified mycosis: Secondary | ICD-10-CM

## 2013-11-13 DIAGNOSIS — M549 Dorsalgia, unspecified: Secondary | ICD-10-CM

## 2013-11-13 DIAGNOSIS — M25561 Pain in right knee: Secondary | ICD-10-CM

## 2013-11-13 DIAGNOSIS — G8929 Other chronic pain: Secondary | ICD-10-CM

## 2013-11-13 LAB — CBC
HCT: 45 % (ref 39.0–52.0)
HEMOGLOBIN: 15.3 g/dL (ref 13.0–17.0)
MCH: 30.8 pg (ref 26.0–34.0)
MCHC: 34 g/dL (ref 30.0–36.0)
MCV: 90.5 fL (ref 78.0–100.0)
Platelets: 200 10*3/uL (ref 150–400)
RBC: 4.97 MIL/uL (ref 4.22–5.81)
RDW: 14 % (ref 11.5–15.5)
WBC: 6 10*3/uL (ref 4.0–10.5)

## 2013-11-13 MED ORDER — NAPROXEN 500 MG PO TABS: 500.0000 mg | ORAL_TABLET | Freq: Every day | ORAL | Status: DC

## 2013-11-13 NOTE — Patient Instructions (Signed)
We'll check your liver tests today.   As long as it looks good I'll send in a refill for the fungus medicine.    Come back in about 1-3 months for recheck/physical.

## 2013-11-14 DIAGNOSIS — G8929 Other chronic pain: Secondary | ICD-10-CM | POA: Insufficient documentation

## 2013-11-14 DIAGNOSIS — M549 Dorsalgia, unspecified: Secondary | ICD-10-CM

## 2013-11-14 DIAGNOSIS — R21 Rash and other nonspecific skin eruption: Secondary | ICD-10-CM | POA: Insufficient documentation

## 2013-11-14 LAB — COMPREHENSIVE METABOLIC PANEL
ALK PHOS: 66 U/L (ref 39–117)
ALT: 17 U/L (ref 0–53)
AST: 17 U/L (ref 0–37)
Albumin: 4.3 g/dL (ref 3.5–5.2)
BUN: 14 mg/dL (ref 6–23)
CALCIUM: 9.6 mg/dL (ref 8.4–10.5)
CO2: 26 mEq/L (ref 19–32)
CREATININE: 0.79 mg/dL (ref 0.50–1.35)
Chloride: 101 mEq/L (ref 96–112)
Glucose, Bld: 86 mg/dL (ref 70–99)
Potassium: 3.8 mEq/L (ref 3.5–5.3)
Sodium: 137 mEq/L (ref 135–145)
Total Bilirubin: 0.5 mg/dL (ref 0.2–1.2)
Total Protein: 7.3 g/dL (ref 6.0–8.3)

## 2013-11-14 MED ORDER — TERBINAFINE HCL 250 MG PO TABS: 250.0000 mg | ORAL_TABLET | Freq: Every day | ORAL | Status: DC

## 2013-11-14 NOTE — Progress Notes (Signed)
Subjective:    Harold MoleMichael A Colon is a 58 y.o. male who presents to [facility] today for several issues:  1.  Knee pain:  Present for "years."  Worsening to point of needing surgery.  Now followed by Ortho who recommends at least Left but actually BL knee replacement. Has received cortisone shots with some relief, but not full relief.  Also some relief with Naprosyn.  Uses heat and ice.  Limits working as he cannot remain on his feet for even short periods of time.  Back:  Also chronic issue.  Had prior MRI which reportedly showed multiple disc protrusions according to patient.  Occasionally with pain radiating down BL legs.  No bladder/bowel incontinence.  No saddle or LE paresthesias/anesthesia.  Limits working as he cannot lift nor stand for even short periods of time.  Rash on foot.  Presented to Urgent Care day prior.  Diagnosed with tinea infection.  Has not yet started medication.  Would like me to look at it.      ROS as above per HPI, otherwise neg.  Pertinently, no chest pain, palpitations, SOB, Fever, Chills, Abd pain, N/V/D.   The following portions of the patient's history were reviewed and updated as appropriate: allergies, current medications, past medical history, family and social history, and problem list. Patient is a nonsmoker.    PMH reviewed.  Past Medical History  Diagnosis Date  . Chest pain   . Cocaine abuse last use 2010  . Herpes   . MRSA (methicillin resistant Staphylococcus aureus)   . OSA (obstructive sleep apnea)   . Arthritis    Past Surgical History  Procedure Laterality Date  . Hand surgery Left 2009    ring finger- tendon repair  . Esophagogastroduodenoscopy  06/10/2011    Procedure: ESOPHAGOGASTRODUODENOSCOPY (EGD);  Surgeon: Shirley FriarVincent C. Schooler, MD;  [facility];  Service: Endoscopy;  Laterality: N/A;  . Colonoscopy  06/10/2011    Procedure: COLONOSCOPY;  Surgeon: Shirley FriarVincent C. Schooler, MD;  [facility];  Service: Endoscopy;  Laterality:  N/A;    Medications reviewed. Current Outpatient Prescriptions  Medication Sig Dispense Refill  . aspirin EC 81 MG tablet Take 81 mg by mouth daily.    [name]. buPROPion (WELLBUTRIN XL) 150 MG 24 hr tablet Take 150 mg by mouth daily.    . clotrimazole-betamethasone (LOTRISONE) cream Apply 1 application topically 2 (two) times daily as needed (for rash).    [name]. ibuprofen (ADVIL,MOTRIN) 800 MG tablet Take 1 tablet (800 mg total) by mouth 3 (three) times daily as needed for headache or moderate pain. 30 tablet 0  . meloxicam (MOBIC) 15 MG tablet Take 1 tablet (15 mg total) by mouth daily as needed for pain. 30 tablet 0  . metroNIDAZOLE (FLAGYL) 500 MG tablet Take 1 tablet (500 mg total) by mouth 2 (two) times daily. 14 tablet 0  . naproxen (NAPROSYN) 500 MG tablet Take 1 tablet (500 mg total) by mouth daily. 30 tablet 1  . terbinafine (LAMISIL) 250 MG tablet Take 1 tablet (250 mg total) by mouth daily. 7 tablet 0   No current facility-administered medications for this visit.     Objective:   Physical Exam BP 128/84 mmHg  Pulse 79  Ht 6\' 1"  (1.854 m)  Wt 246 lb 11.2 oz (111.902 kg)  BMI 32.56 kg/m2 Gen:  Alert, cooperative patient who appears stated age in no acute distress.  Vital signs reviewed. Back:  Nontender while seated on exam table.  Difficulty with both flexion and  extension of back secondary to pain.  SLR positive BL for back pain.  Sensation intact BL LE's.  MSK:  TTP along BL medial and lateral joint lines. Skin:  Well-circumscribed scaly red patch noted lateral aspect of Left foot.  About 3 cm in diameter.  Nontender.  Few scattered, similar but smaller plaques throughout BL legs.

## 2013-11-14 NOTE — Assessment & Plan Note (Signed)
Limits ability to work. Needs replacement.   No access to Ortho imaging here.  Recommended to continue to FU with ortho for eventual surgery.   Will complete letter assessing functional status.

## 2013-11-14 NOTE — Assessment & Plan Note (Signed)
Check LFTs and provide refill for terbinafine

## 2013-11-14 NOTE — Assessment & Plan Note (Signed)
Also limits functional status. Continue home PT as he has been doing.  Continue Naprosyn (refill sent). TOld to stop combo of this and ibuprofen.   FU as needed.   Will complete letter.

## 2013-11-18 ENCOUNTER — Encounter: Payer: Self-pay | Admitting: Family Medicine

## 2013-12-08 ENCOUNTER — Ambulatory Visit: Payer: Medicaid Other | Admitting: Family Medicine

## 2013-12-16 ENCOUNTER — Ambulatory Visit (INDEPENDENT_AMBULATORY_CARE_PROVIDER_SITE_OTHER): Payer: Medicaid Other | Admitting: Family Medicine

## 2013-12-16 ENCOUNTER — Ambulatory Visit (INDEPENDENT_AMBULATORY_CARE_PROVIDER_SITE_OTHER): Payer: Medicaid Other | Admitting: *Deleted

## 2013-12-16 ENCOUNTER — Encounter: Payer: Self-pay | Admitting: Family Medicine

## 2013-12-16 VITALS — BP 124/87 | HR 101 | Temp 98.2°F | Ht 73.0 in | Wt 252.7 lb

## 2013-12-16 DIAGNOSIS — R21 Rash and other nonspecific skin eruption: Secondary | ICD-10-CM

## 2013-12-16 DIAGNOSIS — Z23 Encounter for immunization: Secondary | ICD-10-CM

## 2013-12-16 NOTE — Patient Instructions (Addendum)
Thank you for coming in,   We will call you with the results from today.   Once we get the report on your skin biopsy then we will have a plan of action.   If you have any bleeding from the site. Hold pressure with a bandage for 20 minutes. If that doesn't resolve the bleeding, then please give us a call.     Please feel free to call with any questions or concerns at any time, at [phone]. --Dr. Jordan LikesSchmitz

## 2013-12-16 NOTE — Progress Notes (Signed)
   Subjective:    Patient ID: Fulton MoleMichael A Fels, male    DOB: 02-Sep-1955, 58 y.o.   MRN: [id number]  HPI  Fulton MoleMichael A Gilbert is here for rash f/u.   Rash started about a month ago. It has been getting worse on his stomach. It started on his foot and was prescribed terbinafine for it. The itchiness has gone away but rashes still present. He denies any fevers, chills, night sweats or unintentional weight loss.  He hasn't been exposed to any pets, tried any new deodorants or detergents. He was prescribed Lotrisone but hasn't been using it.   Current Outpatient Prescriptions on File Prior to Visit  Medication Sig Dispense Refill  . aspirin EC 81 MG tablet Take 81 mg by mouth daily.    [name]. buPROPion (WELLBUTRIN XL) 150 MG 24 hr tablet Take 150 mg by mouth daily.    . clotrimazole-betamethasone (LOTRISONE) cream Apply 1 application topically 2 (two) times daily as needed (for rash).    [name]. ibuprofen (ADVIL,MOTRIN) 800 MG tablet Take 1 tablet (800 mg total) by mouth 3 (three) times daily as needed for headache or moderate pain. 30 tablet 0  . meloxicam (MOBIC) 15 MG tablet Take 1 tablet (15 mg total) by mouth daily as needed for pain. 30 tablet 0  . metroNIDAZOLE (FLAGYL) 500 MG tablet Take 1 tablet (500 mg total) by mouth 2 (two) times daily. 14 tablet 0  . naproxen (NAPROSYN) 500 MG tablet Take 1 tablet (500 mg total) by mouth daily. 30 tablet 1  . terbinafine (LAMISIL) 250 MG tablet Take 1 tablet (250 mg total) by mouth daily. 30 tablet 1   No current facility-administered medications on file prior to visit.    Review of Systems See HPI     Objective:   Physical Exam BP 124/87 mmHg  Pulse 101  Temp(Src) 98.2 F (36.8 C) (Oral)  Ht 6\' 1"  (1.854 m)  Wt 252 lb 11.2 oz (114.624 kg)  BMI 33.35 kg/m2 Gen: NAD, alert, cooperative with exam, well-appearing Skin: generlized, annular hyperpigmented scaly plaques. No streaking or erythema.   Skin Punch Biopsy: Right upper chest  Consent obtained and  time out performed. Area cleaned with alcohol and 3 ml of 1% lidocaine with epi injected in the skin. Then area cleaned with betadine.  Then a 2 mm punch biopsy used to remove a portion of the lesion with a border or normal appearing skin. The circle of skin was then undermined with scissors and removed with forceps. A dressing was applied. No bleeding, and the patient did well.       Assessment & Plan:

## 2013-12-17 LAB — RPR

## 2013-12-17 LAB — ANA: Anti Nuclear Antibody(ANA): NEGATIVE

## 2013-12-17 LAB — HIV ANTIBODY (ROUTINE TESTING W REFLEX): HIV 1&2 Ab, 4th Generation: NONREACTIVE

## 2013-12-20 ENCOUNTER — Telehealth: Payer: Self-pay | Admitting: Family Medicine

## 2013-12-20 MED ORDER — CLOBETASOL PROPIONATE 0.05 % EX OINT: 1.0000 "application " | TOPICAL_OINTMENT | Freq: Two times a day (BID) | CUTANEOUS | Status: DC

## 2013-12-20 NOTE — Assessment & Plan Note (Signed)
Stable and unchanged. terbinfine helped with itching but rashes are still appearing. Most likley nummular eczema.  - will await skin bx.  - will try topical steroids  - stop lotrisone  - HIV, RPR and ANA neg

## 2013-12-21 ENCOUNTER — Telehealth: Payer: Self-pay | Admitting: *Deleted

## 2013-12-21 NOTE — Telephone Encounter (Signed)
Spoke with patient and informed him of below 

## 2013-12-21 NOTE — Telephone Encounter (Signed)
-----   Message from Myra RudeJeremy E Schmitz, MD sent at 12/18/2013 11:41 AM EST ----- Please call patient and inform him that all his labs were negative. We will have to wait on the pathology in order to determine how to treat his rashes. Thanks.

## 2013-12-21 NOTE — Telephone Encounter (Signed)
LVM for patient to call back to inform him of below results

## 2013-12-21 NOTE — Telephone Encounter (Signed)
-----   Message from [name] Schmitz, MD sent at 12/18/2013 11:41 AM EST ----- Please call patient and inform him that all his labs were negative. We will have to wait on the pathology in order to determine how to treat his rashes. Thanks. 

## 2013-12-22 ENCOUNTER — Ambulatory Visit: Payer: Medicaid Other | Admitting: Family Medicine

## 2013-12-23 ENCOUNTER — Telehealth: Payer: Self-pay | Admitting: Family Medicine

## 2013-12-24 ENCOUNTER — Telehealth: Payer: Self-pay | Admitting: Family Medicine

## 2013-12-24 NOTE — Telephone Encounter (Signed)
Left voicemail asking for an update for his rash.   Harold RudeJeremy E [name], MD PGY-2, [facility] 12/24/2013, 11:09 AM

## 2013-12-24 NOTE — Telephone Encounter (Signed)
Spoke with patient and gave below messsage

## 2013-12-24 NOTE — Telephone Encounter (Signed)
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 It is to be treated with the topical steroids. It may be caused by a bug bite or some allergen that he has been exposed to. Thanks.

## 2014-01-19 ENCOUNTER — Encounter: Payer: Self-pay | Admitting: Family Medicine

## 2014-01-19 ENCOUNTER — Ambulatory Visit (INDEPENDENT_AMBULATORY_CARE_PROVIDER_SITE_OTHER): Payer: Medicaid Other | Admitting: Family Medicine

## 2014-01-19 ENCOUNTER — Other Ambulatory Visit: Payer: Self-pay | Admitting: Family Medicine

## 2014-01-19 VITALS — BP 137/86 | HR 98 | Ht 73.0 in | Wt 254.0 lb

## 2014-01-19 DIAGNOSIS — M545 Low back pain: Secondary | ICD-10-CM

## 2014-01-19 DIAGNOSIS — M542 Cervicalgia: Secondary | ICD-10-CM

## 2014-01-19 DIAGNOSIS — G8929 Other chronic pain: Secondary | ICD-10-CM

## 2014-01-19 DIAGNOSIS — R21 Rash and other nonspecific skin eruption: Secondary | ICD-10-CM

## 2014-01-19 DIAGNOSIS — M549 Dorsalgia, unspecified: Secondary | ICD-10-CM

## 2014-01-19 MED ORDER — CYCLOBENZAPRINE HCL 10 MG PO TABS: 10.0000 mg | ORAL_TABLET | Freq: Every evening | ORAL | Status: DC | PRN
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
## 2014-01-19 NOTE — Patient Instructions (Signed)
Take the flexeril when needed for back spasms.  We will get you scheduled for the MRI today before you leave.  I'll call with the results.   It was good to see you today.   Come back to see me in about 3 months as long as things are going well.

## 2014-01-20 ENCOUNTER — Encounter: Payer: Self-pay | Admitting: Family Medicine

## 2014-01-20 DIAGNOSIS — G8929 Other chronic pain: Secondary | ICD-10-CM

## 2014-01-20 DIAGNOSIS — M542 Cervicalgia: Secondary | ICD-10-CM | POA: Insufficient documentation

## 2014-01-20 NOTE — Assessment & Plan Note (Signed)
Subacute on chronic condition Naproxen and muscle relaxers should help with this as well.  Told by Orthopedist he had "bulging disc" in his neck in early 2000s via MRI.  Not in our system. Plan repeat today as he has had pain for some time.  SOme paresthesias but no other concerning symptoms.

## 2014-01-20 NOTE — Assessment & Plan Note (Addendum)
Continue Naproxen and home self-directed PT.   No longer taking any other NSAIDs.  Limits functional status -- he is applying for disability.   Has had improvement, especially at night, with muscle relaxers in past.  Can attempt trial of flexeril currently.  Has never had imaging of back except for lumbar films "years ago."  Not in our system, states [facility].   Obtain MRI today to further evaluate pain.

## 2014-01-20 NOTE — Assessment & Plan Note (Signed)
Improving.   Continue steroids PRN.

## 2014-01-27 ENCOUNTER — Ambulatory Visit
Admission: RE | Admit: 2014-01-27 | Discharge: 2014-01-27 | Disposition: A | Discharge status: Home / Self Care | Payer: Medicaid Other | Source: Ambulatory Visit | Attending: Family Medicine | Admitting: Family Medicine

## 2014-01-27 DIAGNOSIS — M545 Low back pain: Secondary | ICD-10-CM

## 2014-01-27 DIAGNOSIS — M542 Cervicalgia: Secondary | ICD-10-CM

## 2014-01-29 ENCOUNTER — Telehealth: Payer: Self-pay | Admitting: Family Medicine

## 2014-01-29 NOTE — Telephone Encounter (Signed)
Called and spoke with patient's friend at home.  Asked her to have patient to return call on Monday when we open.  Informed her this is not an emergency and I wanted to discuss results of MRI.  She expressed understanding and would pass the information on.

## 2014-02-16 ENCOUNTER — Encounter: Payer: Self-pay | Admitting: Family Medicine

## 2014-02-16 ENCOUNTER — Ambulatory Visit (INDEPENDENT_AMBULATORY_CARE_PROVIDER_SITE_OTHER): Payer: Medicaid Other | Admitting: Family Medicine

## 2014-02-16 VITALS — BP 127/85 | HR 68 | Temp 98.3°F | Ht 73.0 in | Wt 255.5 lb

## 2014-02-16 DIAGNOSIS — K922 Gastrointestinal hemorrhage, unspecified: Secondary | ICD-10-CM

## 2014-02-16 DIAGNOSIS — R21 Rash and other nonspecific skin eruption: Secondary | ICD-10-CM

## 2014-02-16 DIAGNOSIS — M549 Dorsalgia, unspecified: Secondary | ICD-10-CM

## 2014-02-16 DIAGNOSIS — Z720 Tobacco use: Secondary | ICD-10-CM

## 2014-02-16 DIAGNOSIS — G8929 Other chronic pain: Secondary | ICD-10-CM

## 2014-02-16 MED ORDER — CLOBETASOL PROPIONATE 0.05 % EX OINT: 1.0000 "application " | TOPICAL_OINTMENT | Freq: Two times a day (BID) | CUTANEOUS | Status: DC

## 2014-02-16 MED ORDER — NICOTINE 21 MG/24HR TD PT24: 21.0000 mg | MEDICATED_PATCH | Freq: Every day | TRANSDERMAL | Status: DC
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
## 2014-02-16 NOTE — Patient Instructions (Signed)
I will refer you to   LIver check today.  Refill for the medicine if your liver's okay.  I've sent in the patch for you.  Come back to see me in about 6 months or so.

## 2014-02-17 LAB — HEPATIC FUNCTION PANEL
ALK PHOS: 61 U/L (ref 39–117)
ALT: 24 U/L (ref 0–53)
AST: 20 U/L (ref 0–37)
Albumin: 4 g/dL (ref 3.5–5.2)
Bilirubin, Direct: 0.1 mg/dL (ref 0.0–0.3)
Indirect Bilirubin: 0.4 mg/dL (ref 0.2–1.2)
Total Bilirubin: 0.5 mg/dL (ref 0.2–1.2)
Total Protein: 7.2 g/dL (ref 6.0–8.3)

## 2014-02-17 MED ORDER — TERBINAFINE HCL 250 MG PO TABS: 250.0000 mg | ORAL_TABLET | Freq: Every day | ORAL | Status: DC

## 2014-02-17 NOTE — Assessment & Plan Note (Signed)
Counseled to quit today.  Will attempt nicotine patch.  Discussed referral to pharm clinic as well in future if he desires.

## 2014-02-17 NOTE — Assessment & Plan Note (Signed)
NSAID use noted.   Has not had any issues with this for use.

## 2014-02-17 NOTE — Assessment & Plan Note (Signed)
Consistent with tinea corporis today on exam. Refill for terbinafine if LFTs are okay.

## 2014-02-17 NOTE — Assessment & Plan Note (Signed)
Continue Alleve for symptomatic relief.   Will refer for spinal specialty eval and recommendations today.

## 2014-04-12 ENCOUNTER — Ambulatory Visit (INDEPENDENT_AMBULATORY_CARE_PROVIDER_SITE_OTHER): Payer: Medicaid Other | Admitting: Family Medicine

## 2014-04-12 ENCOUNTER — Encounter: Payer: Self-pay | Admitting: Family Medicine

## 2014-04-12 VITALS — BP 111/79 | HR 85 | Temp 98.4°F | Ht 73.0 in | Wt 257.2 lb

## 2014-04-12 DIAGNOSIS — G8929 Other chronic pain: Secondary | ICD-10-CM

## 2014-04-12 DIAGNOSIS — M549 Dorsalgia, unspecified: Secondary | ICD-10-CM

## 2014-04-12 DIAGNOSIS — M48061 Spinal stenosis, lumbar region without neurogenic claudication: Secondary | ICD-10-CM

## 2014-04-12 DIAGNOSIS — M539 Dorsopathy, unspecified: Secondary | ICD-10-CM | POA: Insufficient documentation

## 2014-04-12 DIAGNOSIS — M4806 Spinal stenosis, lumbar region: Secondary | ICD-10-CM

## 2014-04-12 MED ORDER — HYDROCODONE-ACETAMINOPHEN 10-325 MG PO TABS: 1.0000 | ORAL_TABLET | Freq: Three times a day (TID) | ORAL | Status: DC | PRN

## 2014-04-12 NOTE — Patient Instructions (Signed)
Thank you for coming in, today!  I will give you a short course of Vicodin (hydrocodone) for your back pain. I spoke with Dr. Gwendolyn GrantWalden and we made sure your referral was put in. If you haven't heard from anyone in a week or two, call or come back to see Dr. Gwendolyn GrantWalden. Otherwise, come back as you need.  Please feel free to call with any questions or concerns at any time, at [phone]. --Dr. Casper HarrisonStreet
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
## 2014-04-13 NOTE — Progress Notes (Signed)
I was the preceptor on the day of this visit.   Romi Rathel MD  

## 2014-04-26 ENCOUNTER — Telehealth: Payer: Self-pay | Admitting: Family Medicine

## 2014-04-26 DIAGNOSIS — M48061 Spinal stenosis, lumbar region without neurogenic claudication: Secondary | ICD-10-CM

## 2014-04-26 DIAGNOSIS — M542 Cervicalgia: Secondary | ICD-10-CM

## 2014-04-26 DIAGNOSIS — G8929 Other chronic pain: Secondary | ICD-10-CM

## 2014-04-26 NOTE — Telephone Encounter (Signed)
Will complete this and send it in.

## 2014-04-26 NOTE — Telephone Encounter (Signed)
Need referral for [facility] for Neurology please contact pt when complete / thanks Dorothey BasemanSadie Reynolds, ASA

## 2014-05-10 ENCOUNTER — Encounter (HOSPITAL_COMMUNITY): Payer: Self-pay

## 2014-05-10 ENCOUNTER — Emergency Department (INDEPENDENT_AMBULATORY_CARE_PROVIDER_SITE_OTHER): Payer: Medicaid Other

## 2014-05-10 ENCOUNTER — Emergency Department (HOSPITAL_COMMUNITY)
Admission: EM | Admit: 2014-05-10 | Discharge: 2014-05-10 | Disposition: A | Discharge status: Home / Self Care | Payer: Medicaid Other | Source: Home / Self Care | Attending: Family Medicine | Admitting: Family Medicine

## 2014-05-10 DIAGNOSIS — K5901 Slow transit constipation: Secondary | ICD-10-CM

## 2014-05-10 DIAGNOSIS — R1013 Epigastric pain: Secondary | ICD-10-CM

## 2014-05-10 LAB — POCT I-STAT, CHEM 8
BUN: 16 mg/dL (ref 6–20)
CALCIUM ION: 1.23 mmol/L (ref 1.12–1.23)
CHLORIDE: 102 mmol/L (ref 101–111)
CREATININE: 1 mg/dL (ref 0.61–1.24)
GLUCOSE: 96 mg/dL (ref 70–99)
HEMATOCRIT: 50 % (ref 39.0–52.0)
Hemoglobin: 17 g/dL (ref 13.0–17.0)
Potassium: 4.3 mmol/L (ref 3.5–5.1)
SODIUM: 140 mmol/L (ref 135–145)
TCO2: 25 mmol/L (ref 0–100)

## 2014-05-10 NOTE — ED Notes (Signed)
C/o pain epigastric area past few days every time he eats, and stools are darker than usual
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
## 2014-05-10 NOTE — Discharge Instructions (Signed)
Constipation Miralax as directed. Lots of fluids and fiber. Constipation is when a person has fewer than three bowel movements a week, has difficulty having a bowel movement, or has stools that are dry, hard, or larger than normal. As people grow older, constipation is more common. If you try to fix constipation with medicines that make you have a bowel movement (laxatives), the problem may get worse. Long-term laxative use may cause the muscles of the colon to become weak. A low-fiber diet, not taking in enough fluids, and taking certain medicines may make constipation worse.  CAUSES   Certain medicines, such as antidepressants, pain medicine, iron supplements, antacids, and water pills.   Certain diseases, such as diabetes, irritable bowel syndrome (IBS), thyroid disease, or depression.   Not drinking enough water.   Not eating enough fiber-rich foods.   Stress or travel.   Lack of physical activity or exercise.   Ignoring the urge to have a bowel movement.   Using laxatives too much.  SIGNS AND SYMPTOMS   Having fewer than three bowel movements a week.   Straining to have a bowel movement.   Having stools that are hard, dry, or larger than normal.   Feeling full or bloated.   Pain in the lower abdomen.   Not feeling relief after having a bowel movement.  DIAGNOSIS  Your health care provider will take a medical history and perform a physical exam. Further testing may be done for severe constipation. Some tests may include:  A barium enema X-ray to examine your rectum, colon, and, sometimes, your small intestine.   A sigmoidoscopy to examine your lower colon.   A colonoscopy to examine your entire colon. TREATMENT  Treatment will depend on the severity of your constipation and what is causing it. Some dietary treatments include drinking more fluids and eating more fiber-rich foods. Lifestyle treatments may include regular exercise. If these diet and  lifestyle recommendations do not help, your health care provider may recommend taking over-the-counter laxative medicines to help you have bowel movements. Prescription medicines may be prescribed if over-the-counter medicines do not work.  HOME CARE INSTRUCTIONS   Eat foods that have a lot of fiber, such as fruits, vegetables, whole grains, and beans.  Limit foods high in fat and processed sugars, such as french fries, hamburgers, cookies, candies, and soda.   A fiber supplement may be added to your diet if you cannot get enough fiber from foods.   Drink enough fluids to keep your urine clear or pale yellow.   Exercise regularly or as directed by your health care provider.   Go to the restroom when you have the urge to go. Do not hold it.   Only take over-the-counter or prescription medicines as directed by your health care provider. Do not take other medicines for constipation without talking to your health care provider first.  SEEK IMMEDIATE MEDICAL CARE IF:   You have bright red blood in your stool.   Your constipation lasts for more than 4 days or gets worse.   You have abdominal or rectal pain.   You have thin, pencil-like stools.   You have unexplained weight loss. MAKE SURE YOU:   Understand these instructions.  Will watch your condition.  Will get help right away if you are not doing well or get worse. Document Released: 09/23/2003 Document Revised: 12/30/2012 Document Reviewed: 10/06/2012 [facility] Patient Information 2015 [place], [place]. This information is not intended to replace advice given to you by your health  care provider. Make sure you discuss any questions you have with your health care provider.  Abdominal Pain Many things can cause abdominal pain. Usually, abdominal pain is not caused by a disease and will improve without treatment. It can often be observed and treated at home. Your health care provider will do a physical exam and possibly order  blood tests and X-rays to help determine the seriousness of your pain. However, in many cases, more time must pass before a clear cause of the pain can be found. Before that point, your health care provider may not know if you need more testing or further treatment. HOME CARE INSTRUCTIONS  Monitor your abdominal pain for any changes. The following actions may help to alleviate any discomfort you are experiencing:  Only take over-the-counter or prescription medicines as directed by your health care provider.  Do not take laxatives unless directed to do so by your health care provider.  Try a clear liquid diet (broth, tea, or water) as directed by your health care provider. Slowly move to a bland diet as tolerated. SEEK MEDICAL CARE IF:  You have unexplained abdominal pain.  You have abdominal pain associated with nausea or diarrhea.  You have pain when you urinate or have a bowel movement.  You experience abdominal pain that wakes you in the night.  You have abdominal pain that is worsened or improved by eating food.  You have abdominal pain that is worsened with eating fatty foods.  You have a fever. SEEK IMMEDIATE MEDICAL CARE IF:   Your pain does not go away within 2 hours.  You keep throwing up (vomiting).  Your pain is felt only in portions of the abdomen, such as the right side or the left lower portion of the abdomen.  You pass bloody or black tarry stools. MAKE SURE YOU:  Understand these instructions.   Will watch your condition.   Will get help right away if you are not doing well or get worse.  Document Released: 10/04/2004 Document Revised: 12/30/2012 Document Reviewed: 09/03/2012 [facility] Patient Information 2015 [place]. This information is not intended to replace advice given to you by your health care provider. Make sure you discuss any questions you have with your health care provider.

## 2014-05-26 ENCOUNTER — Ambulatory Visit (INDEPENDENT_AMBULATORY_CARE_PROVIDER_SITE_OTHER): Payer: Medicaid Other | Admitting: Family Medicine

## 2014-05-26 ENCOUNTER — Encounter: Payer: Self-pay | Admitting: Family Medicine

## 2014-05-26 VITALS — BP 110/73 | HR 70 | Temp 98.2°F | Ht 73.0 in | Wt 250.9 lb

## 2014-05-26 DIAGNOSIS — M25561 Pain in right knee: Secondary | ICD-10-CM | POA: Diagnosis not present

## 2014-05-26 DIAGNOSIS — M25562 Pain in left knee: Secondary | ICD-10-CM | POA: Diagnosis not present

## 2014-05-26 DIAGNOSIS — R21 Rash and other nonspecific skin eruption: Secondary | ICD-10-CM

## 2014-05-26 MED ORDER — CLOBETASOL PROPIONATE 0.05 % EX OINT: 1.0000 "application " | TOPICAL_OINTMENT | Freq: Two times a day (BID) | CUTANEOUS | Status: DC

## 2014-05-26 MED ORDER — TERBINAFINE HCL 250 MG PO TABS: 250.0000 mg | ORAL_TABLET | Freq: Every day | ORAL | Status: DC

## 2014-05-26 NOTE — Assessment & Plan Note (Signed)
Agree with previous evaluation this is consistent with tinea corporis, he previously improved with terbinafine Terbinafine was represcribed by his PCP, however the patient states that he was unaware that it was there at the pharmacy for him, he has not been taking it. He requests refill ointment, clobetasol He would also like the antifungal pill hasn't helped so much previously. Previous LFTs normal Follow-up one month with PCP

## 2014-05-26 NOTE — Patient Instructions (Signed)
Great to meet you!  Please come to see Dr,. Gwendolyn GrantWalden in 1 month  Do not put the ointment on your face or groin  If you develop any worrisome signs come back sooner, also if you develop bleeding or fevers.

## 2014-05-26 NOTE — Progress Notes (Signed)
Patient ID: Harold Colon, male   DOB: January 28, 1955, 59 y.o.   MRN: [id number]   HPI  Patient presents today for rash  Patient states that over the last several months this rash is persisted.It now seems to be getting worse. He describes it as not particularly itchy or bothersome rash that started on his left foot and is now spread to spots throughout his body. He has one in his right dorsal hand which drains a little bit of clear fluid.  He denies fevers, chills, sweats. He was recently diagnosed with constipation and is taking Metamucil for this and slowly improving.  He states that he had good improvement of the rash with Lamisil oral but then never got a refill after his liver labs. He was unaware that this was prescribed for him. He would like a refill on the clobetasol ointment as well.  Smoking status noted ROS: Per HPI  Objective: BP 110/73 mmHg  Pulse 70  Temp(Src) 98.2 F (36.8 C) (Oral)  Ht 6\' 1"  (1.854 m)  Wt 250 lb 14.4 oz (113.807 kg)  BMI 33.11 kg/m2 Gen: NAD, alert, cooperative with exam HEENT: NCAT CV: RRR, good S1/S2, no murmur Resp: CTABL, no wheezes, non-labored Abd: SNTND, BS present, no guarding or organomegaly Ext: No edema, warm Neuro: Alert and oriented, No gross deficits Skin: 50-100 dry scaly hyperpigmented macules with some raised areas within them scattered across feet, bilateral legs, trunk, one on hand as well.  Assessment and plan:  Rash Agree with previous evaluation this is consistent with tinea corporis, he previously improved with terbinafine Terbinafine was represcribed by his PCP, however the patient states that he was unaware that it was there at the pharmacy for him, he has not been taking it. He requests refill ointment, clobetasol He would also like the antifungal pill hasn't helped so much previously. Previous LFTs normal Follow-up one month with PCP      Meds ordered this encounter  Medications  . terbinafine (LAMISIL) 250 MG  tablet    Sig: Take 1 tablet (250 mg total) by mouth daily.    Dispense:  30 tablet    Refill:  3  . clobetasol ointment (TEMOVATE) 0.05 %    Sig: Apply 1 application topically 2 (two) times daily. DO NOT APPLY TO FACE or groin    Dispense:  60 g    Refill:  0

## 2014-06-02 ENCOUNTER — Emergency Department (INDEPENDENT_AMBULATORY_CARE_PROVIDER_SITE_OTHER)
Admission: EM | Admit: 2014-06-02 | Discharge: 2014-06-02 | Disposition: A | Discharge status: Home / Self Care | Payer: Medicaid Other | Source: Home / Self Care | Attending: Family Medicine | Admitting: Family Medicine

## 2014-06-02 ENCOUNTER — Encounter (HOSPITAL_COMMUNITY): Payer: Self-pay | Admitting: Emergency Medicine

## 2014-06-02 ENCOUNTER — Telehealth: Payer: Self-pay | Admitting: *Deleted

## 2014-06-02 ENCOUNTER — Other Ambulatory Visit: Payer: Self-pay | Admitting: Family Medicine

## 2014-06-02 DIAGNOSIS — R1011 Right upper quadrant pain: Secondary | ICD-10-CM

## 2014-06-02 DIAGNOSIS — R1013 Epigastric pain: Secondary | ICD-10-CM | POA: Diagnosis not present

## 2014-06-02 LAB — CBC
HCT: 44.6 % (ref 39.0–52.0)
Hemoglobin: 15.1 g/dL (ref 13.0–17.0)
MCH: 31.1 pg (ref 26.0–34.0)
MCHC: 33.9 g/dL (ref 30.0–36.0)
MCV: 91.8 fL (ref 78.0–100.0)
Platelets: 219 10*3/uL (ref 150–400)
RBC: 4.86 MIL/uL (ref 4.22–5.81)
RDW: 12.9 % (ref 11.5–15.5)
WBC: 5.6 10*3/uL (ref 4.0–10.5)

## 2014-06-02 LAB — COMPREHENSIVE METABOLIC PANEL
ALT: 24 U/L (ref 17–63)
ANION GAP: 9 (ref 5–15)
AST: 24 U/L (ref 15–41)
Albumin: 3.7 g/dL (ref 3.5–5.0)
Alkaline Phosphatase: 71 U/L (ref 38–126)
BILIRUBIN TOTAL: 0.6 mg/dL (ref 0.3–1.2)
BUN: 11 mg/dL (ref 6–20)
CHLORIDE: 102 mmol/L (ref 101–111)
CO2: 27 mmol/L (ref 22–32)
Calcium: 9.4 mg/dL (ref 8.9–10.3)
Creatinine, Ser: 0.83 mg/dL (ref 0.61–1.24)
GFR calc Af Amer: >60 mL/min (ref >60)
GLUCOSE: 95 mg/dL (ref 65–99)
POTASSIUM: 4.2 mmol/L (ref 3.5–5.1)
Sodium: 138 mmol/L (ref 135–145)
TOTAL PROTEIN: 7.2 g/dL (ref 6.5–8.1)

## 2014-06-02 LAB — LIPASE, BLOOD: Lipase: 42 U/L (ref 22–51)
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
## 2014-06-02 NOTE — Telephone Encounter (Signed)
LVM for pt to call to inform him of his US appt scheduled on 06/03/2013.  [facility] 10:45am arrival.  He is to be NPO for 6 hours prior to US. Lamonte SakaiZimmerman Rumple, April D, [place]

## 2014-06-02 NOTE — Discharge Instructions (Signed)
Thank you for coming in today. We will call you if your labs are positive.  If your belly pain worsens, or you have high fever, bad vomiting, blood in your stool or black tarry stool go to the Emergency Room.   Cholecystitis Cholecystitis is an inflammation of your gallbladder. It is usually caused by a buildup of gallstones or sludge (cholelithiasis) in your gallbladder. The gallbladder stores a fluid that helps digest fats (bile). Cholecystitis is serious and needs treatment right away.  CAUSES   Gallstones. Gallstones can block the tube that leads to your gallbladder, causing bile to build up. As bile builds up, the gallbladder becomes inflamed.  Bile duct problems, such as blockage from scarring or kinking.  Tumors. Tumors can stop bile from leaving your gallbladder correctly, causing bile to build up. As bile builds up, the gallbladder becomes inflamed. SYMPTOMS   Nausea.  Vomiting.  Abdominal pain, especially in the upper right area of your abdomen.  Abdominal tenderness or bloating.  Sweating.  Chills.  Fever.  Yellowing of the skin and the whites of the eyes (jaundice). DIAGNOSIS  Your caregiver may order blood tests to look for infection or gallbladder problems. Your caregiver may also order imaging tests, such as an ultrasound or computed tomography (CT) scan. Further tests may include a hepatobiliary iminodiacetic acid (HIDA) scan. This scan allows your caregiver to see your bile move from the liver to the gallbladder and to the small intestine. TREATMENT  A hospital stay is usually necessary to lessen the inflammation of your gallbladder. You may be required to not eat or drink (fast) for a certain amount of time. You may be given medicine to treat pain or an antibiotic medicine to treat an infection. Surgery may be needed to remove your gallbladder (cholecystectomy) once the inflammation has gone down. Surgery may be needed right away if you develop complications such as  death of gallbladder tissue (gangrene) or a tear (perforation) of the gallbladder.  HOME CARE INSTRUCTIONS  Home care will depend on your treatment. In general:  If you were given antibiotics, take them as directed. Finish them even if you start to feel better.  Only take over-the-counter or prescription medicines for pain, discomfort, or fever as directed by your caregiver.  Follow a low-fat diet until you see your caregiver again.  Keep all follow-up visits as directed by your caregiver. SEEK IMMEDIATE MEDICAL CARE IF:   Your pain is increasing and not controlled by medicines.  Your pain moves to another part of your abdomen or to your back.  You have a fever.  You have nausea and vomiting. MAKE SURE YOU:  Understand these instructions.  Will watch your condition.  Will get help right away if you are not doing well or get worse. Document Released: 12/25/2004 Document Revised: 03/19/2011 Document Reviewed: 11/10/2010 [facility] Patient Information 2015 [place], [place]. This information is not intended to replace advice given to you by your health care provider. Make sure you discuss any questions you have with your health care provider.

## 2014-06-02 NOTE — ED Notes (Signed)
Here for follow up on abd pain States he was seen here and was dx with constipation States he is still taking meds Have not had any relief

## 2014-06-03 ENCOUNTER — Encounter (HOSPITAL_COMMUNITY): Payer: Self-pay | Admitting: Neurology

## 2014-06-03 ENCOUNTER — Emergency Department (HOSPITAL_COMMUNITY)
Admission: EM | Admit: 2014-06-03 | Discharge: 2014-06-03 | Disposition: A | Discharge status: Home / Self Care | Payer: Medicaid Other | Attending: Emergency Medicine | Admitting: Emergency Medicine

## 2014-06-03 ENCOUNTER — Emergency Department (HOSPITAL_COMMUNITY): Payer: Medicaid Other

## 2014-06-03 DIAGNOSIS — Z8614 Personal history of Methicillin resistant Staphylococcus aureus infection: Secondary | ICD-10-CM | POA: Diagnosis not present

## 2014-06-03 DIAGNOSIS — R748 Abnormal levels of other serum enzymes: Secondary | ICD-10-CM | POA: Insufficient documentation

## 2014-06-03 DIAGNOSIS — Z720 Tobacco use: Secondary | ICD-10-CM | POA: Insufficient documentation

## 2014-06-03 DIAGNOSIS — R1010 Upper abdominal pain, unspecified: Secondary | ICD-10-CM | POA: Diagnosis not present

## 2014-06-03 DIAGNOSIS — M1990 Unspecified osteoarthritis, unspecified site: Secondary | ICD-10-CM | POA: Diagnosis not present

## 2014-06-03 DIAGNOSIS — Z79899 Other long term (current) drug therapy: Secondary | ICD-10-CM | POA: Insufficient documentation

## 2014-06-03 DIAGNOSIS — Z8619 Personal history of other infectious and parasitic diseases: Secondary | ICD-10-CM | POA: Insufficient documentation

## 2014-06-03 DIAGNOSIS — Z7982 Long term (current) use of aspirin: Secondary | ICD-10-CM | POA: Diagnosis not present

## 2014-06-03 DIAGNOSIS — Z8669 Personal history of other diseases of the nervous system and sense organs: Secondary | ICD-10-CM | POA: Insufficient documentation

## 2014-06-03 LAB — COMPREHENSIVE METABOLIC PANEL
ALT: 19 U/L (ref 17–63)
ANION GAP: 10 (ref 5–15)
AST: 24 U/L (ref 15–41)
Albumin: 3.9 g/dL (ref 3.5–5.0)
Alkaline Phosphatase: 68 U/L (ref 38–126)
BUN: 9 mg/dL (ref 6–20)
CALCIUM: 9.5 mg/dL (ref 8.9–10.3)
CO2: 25 mmol/L (ref 22–32)
CREATININE: 0.97 mg/dL (ref 0.61–1.24)
Chloride: 101 mmol/L (ref 101–111)
GLUCOSE: 99 mg/dL (ref 65–99)
Potassium: 4.1 mmol/L (ref 3.5–5.1)
Sodium: 136 mmol/L (ref 135–145)
Total Bilirubin: 0.9 mg/dL (ref 0.3–1.2)
Total Protein: 7.1 g/dL (ref 6.5–8.1)

## 2014-06-03 LAB — CBC WITH DIFFERENTIAL/PLATELET
Basophils Absolute: 0 10*3/uL (ref 0.0–0.1)
Basophils Relative: 0 % (ref 0–1)
Eosinophils Absolute: 0.2 10*3/uL (ref 0.0–0.7)
Eosinophils Relative: 3 % (ref 0–5)
HCT: 44.8 % (ref 39.0–52.0)
Hemoglobin: 15 g/dL (ref 13.0–17.0)
LYMPHS ABS: 2.5 10*3/uL (ref 0.7–4.0)
Lymphocytes Relative: 39 % (ref 12–46)
MCH: 30.5 pg (ref 26.0–34.0)
MCHC: 33.5 g/dL (ref 30.0–36.0)
MCV: 91.2 fL (ref 78.0–100.0)
Monocytes Absolute: 0.5 10*3/uL (ref 0.1–1.0)
Monocytes Relative: 7 % (ref 3–12)
Neutro Abs: 3.2 10*3/uL (ref 1.7–7.7)
Neutrophils Relative %: 51 % (ref 43–77)
Platelets: 223 10*3/uL (ref 150–400)
RBC: 4.91 MIL/uL (ref 4.22–5.81)
RDW: 12.7 % (ref 11.5–15.5)
WBC: 6.4 10*3/uL (ref 4.0–10.5)

## 2014-06-03 LAB — LIPASE, BLOOD: Lipase: 70 U/L — ABNORMAL HIGH (ref 22–51)

## 2014-06-03 MED ORDER — OMEPRAZOLE 20 MG PO CPDR: 20.0000 mg | DELAYED_RELEASE_CAPSULE | Freq: Every day | ORAL | Status: DC

## 2014-06-03 MED ORDER — HYDROCODONE-ACETAMINOPHEN 5-325 MG PO TABS: 1.0000 | ORAL_TABLET | Freq: Four times a day (QID) | ORAL | Status: DC | PRN

## 2014-06-03 MED ORDER — HYDROCODONE-ACETAMINOPHEN 5-325 MG PO TABS
2.0000 | ORAL_TABLET | Freq: Once | ORAL | Status: AC
  Administered 2014-06-03: 2 via ORAL
  Filled 2014-06-03: qty 2

## 2014-06-03 NOTE — ED Notes (Signed)
Called for Triage and no response.

## 2014-06-03 NOTE — Telephone Encounter (Signed)
LVM for pt to return call to inform of below. Zimmerman Rumple, April D, CMA  

## 2014-06-03 NOTE — Discharge Instructions (Signed)
Gastroesophageal Reflux Disease, Adult Gastroesophageal reflux disease (GERD) happens when acid from your stomach flows up into the esophagus. When acid comes in contact with the esophagus, the acid causes soreness (inflammation) in the esophagus. Over time, GERD may create small holes (ulcers) in the lining of the esophagus. CAUSES   Increased body weight. This puts pressure on the stomach, making acid rise from the stomach into the esophagus.  Smoking. This increases acid production in the stomach.  Drinking alcohol. This causes decreased pressure in the lower esophageal sphincter (valve or ring of muscle between the esophagus and stomach), allowing acid from the stomach into the esophagus.  Late evening meals and a full stomach. This increases pressure and acid production in the stomach.  A malformed lower esophageal sphincter. Sometimes, no cause is found. SYMPTOMS   Burning pain in the lower part of the mid-chest behind the breastbone and in the mid-stomach area. This may occur twice a week or more often.  Trouble swallowing.  Sore throat.  Dry cough.  Asthma-like symptoms including chest tightness, shortness of breath, or wheezing. DIAGNOSIS  Your caregiver may be able to diagnose GERD based on your symptoms. In some cases, X-rays and other tests may be done to check for complications or to check the condition of your stomach and esophagus. TREATMENT  Your caregiver may recommend over-the-counter or prescription medicines to help decrease acid production. Ask your caregiver before starting or adding any new medicines.  HOME CARE INSTRUCTIONS   Change the factors that you can control. Ask your caregiver for guidance concerning weight loss, quitting smoking, and alcohol consumption.  Avoid foods and drinks that make your symptoms worse, such as:  Caffeine or alcoholic drinks.  Chocolate.  Peppermint or mint flavorings.  Garlic and onions.  Spicy foods.  Citrus fruits,  such as oranges, lemons, or limes.  Tomato-based foods such as sauce, chili, salsa, and pizza.  Fried and fatty foods.  Avoid lying down for the 3 hours prior to your bedtime or prior to taking a nap.  Eat small, frequent meals instead of large meals.  Wear loose-fitting clothing. Do not wear anything tight around your waist that causes pressure on your stomach.  Raise the head of your bed 6 to 8 inches with wood blocks to help you sleep. Extra pillows will not help.  Only take over-the-counter or prescription medicines for pain, discomfort, or fever as directed by your caregiver.  Do not take aspirin, ibuprofen, or other nonsteroidal anti-inflammatory drugs (NSAIDs). SEEK IMMEDIATE MEDICAL CARE IF:   You have pain in your arms, neck, jaw, teeth, or back.  Your pain increases or changes in intensity or duration.  You develop nausea, vomiting, or sweating (diaphoresis).  You develop shortness of breath, or you faint.  Your vomit is green, yellow, black, or looks like coffee grounds or blood.  Your stool is red, bloody, or black. These symptoms could be signs of other problems, such as heart disease, gastric bleeding, or esophageal bleeding. MAKE SURE YOU:   Understand these instructions.  Will watch your condition.  Will get help right away if you are not doing well or get worse. Document Released: 10/04/2004 Document Revised: 03/19/2011 Document Reviewed: 07/14/2010 [facility] Patient Information 2015 [place], [place]. This information is not intended to replace advice given to you by your health care provider. Make sure you discuss any questions you have with your health care provider. Low-Fat Diet for Pancreatitis or Gallbladder Conditions A low-fat diet can be helpful if you have  pancreatitis or a gallbladder condition. With these conditions, your pancreas and gallbladder have trouble digesting fats. A healthy eating plan with less fat will help rest your pancreas and  gallbladder and reduce your symptoms. WHAT DO I NEED TO KNOW ABOUT THIS DIET?  Eat a low-fat diet.  Reduce your fat intake to less than 20-30% of your total daily calories. This is less than 50-60 g of fat per day.  Remember that you need some fat in your diet. Ask your dietician what your daily goal should be.  Choose nonfat and low-fat healthy foods. Look for the words "nonfat," "low fat," or "fat free."  As a guide, look on the label and choose foods with less than 3 g of fat per serving. Eat only one serving.  Avoid alcohol.  Do not smoke. If you need help quitting, talk with your health care provider.  Eat small frequent meals instead of three large heavy meals. WHAT FOODS CAN I EAT? Grains Include healthy grains and starches such as potatoes, wheat bread, fiber-rich cereal, and brown rice. Choose whole grain options whenever possible. In adults, whole grains should account for 45-65% of your daily calories.  Fruits and Vegetables Eat plenty of fruits and vegetables. Fresh fruits and vegetables add fiber to your diet. Meats and Other Protein Sources Eat lean meat such as chicken and pork. Trim any fat off of meat before cooking it. Eggs, fish, and beans are other sources of protein. In adults, these foods should account for 10-35% of your daily calories. Dairy Choose low-fat milk and dairy options. Dairy includes fat and protein, as well as calcium.  Fats and Oils Limit high-fat foods such as fried foods, sweets, baked goods, sugary drinks.  Other Creamy sauces and condiments, such as mayonnaise, can add extra fat. Think about whether or not you need to use them, or use smaller amounts or low fat options. WHAT FOODS ARE NOT RECOMMENDED?  High fat foods, such as:  [employer].  Ice cream.  [place] toast.  Sweet rolls.  Pizza.  Cheese bread.  Foods covered with batter, butter, creamy sauces, or cheese.  Fried foods.  Sugary drinks and desserts.  Foods that cause  gas or bloating Document Released: 12/30/2012 Document Reviewed: 12/30/2012 [facility] Patient Information 2015 [place], [place]. This information is not intended to replace advice given to you by your health care provider. Make sure you discuss any questions you have with your health care provider. Acute Pancreatitis Acute pancreatitis is a disease in which the pancreas becomes suddenly inflamed. The pancreas is a large gland located behind your stomach. The pancreas produces enzymes that help digest food. The pancreas also releases the hormones glucagon and insulin that help regulate blood sugar. Damage to the pancreas occurs when the digestive enzymes from the pancreas are activated and begin attacking the pancreas before being released into the intestine. Most acute attacks last a couple of days and can cause serious complications. Some people become dehydrated and develop low blood pressure. In severe cases, bleeding into the pancreas can lead to shock and can be life-threatening. The lungs, heart, and kidneys may fail. CAUSES  Pancreatitis can happen to anyone. In some cases, the cause is unknown. Most cases are caused by:  Alcohol abuse.  Gallstones. Other less common causes are:  Certain medicines.  Exposure to certain chemicals.  Infection.  Damage caused by an accident (trauma).  Abdominal surgery. SYMPTOMS   Pain in the upper abdomen that may radiate to the back.  Tenderness and swelling of the abdomen.  Nausea and vomiting. DIAGNOSIS  Your caregiver will perform a physical exam. Blood and stool tests may be done to confirm the diagnosis. Imaging tests may also be done, such as X-rays, CT scans, or an ultrasound of the abdomen. TREATMENT  Treatment usually requires a stay in the hospital. Treatment may include:  Pain medicine.  Fluid replacement through an intravenous line (IV).  Placing a tube in the stomach to remove stomach contents and control vomiting.  Not eating  for 3 or 4 days. This gives your pancreas a rest, because enzymes are not being produced that can cause further damage.  Antibiotic medicines if your condition is caused by an infection.  Surgery of the pancreas or gallbladder. HOME CARE INSTRUCTIONS   Follow the diet advised by your caregiver. This may involve avoiding alcohol and decreasing the amount of fat in your diet.  Eat smaller, more frequent meals. This reduces the amount of digestive juices the pancreas produces.  Drink enough fluids to keep your urine clear or pale yellow.  Only take over-the-counter or prescription medicines as directed by your caregiver.  Avoid drinking alcohol if it caused your condition.  Do not smoke.  Get plenty of rest.  Check your blood sugar at home as directed by your caregiver.  Keep all follow-up appointments as directed by your caregiver. SEEK MEDICAL CARE IF:   You do not recover as quickly as expected.  You develop new or worsening symptoms.  You have persistent pain, weakness, or nausea.  You recover and then have another episode of pain. SEEK IMMEDIATE MEDICAL CARE IF:   You are unable to eat or keep fluids down.  Your pain becomes severe.  You have a fever or persistent symptoms for more than 2 to 3 days.  You have a fever and your symptoms suddenly get worse.  Your skin or the white part of your eyes turn yellow (jaundice).  You develop vomiting.  You feel dizzy, or you faint.  Your blood sugar is high (over 300 mg/dL). MAKE SURE YOU:   Understand these instructions.  Will watch your condition.  Will get help right away if you are not doing well or get worse. Document Released: 12/25/2004 Document Revised: 06/26/2011 Document Reviewed: 04/05/2011 [facility] Patient Information 2015 [place], [place]. This information is not intended to replace advice given to you by your health care provider. Make sure you discuss any questions you have with your health care  provider.
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
## 2014-06-03 NOTE — ED Notes (Signed)
Pt reports mid abdominal pain that radiates down abdomen. Was seen at [facility] yesterday and told gallstones and scheduled for US. Reports has been going on for weeks after he eats. Denies n/v. Pt is a x 4. Also reports less frequent stools, and small amt blood in stool.

## 2014-06-03 NOTE — ED Notes (Signed)
Pt is in stable condition upon d/c and ambulates from ED. 

## 2014-06-04 ENCOUNTER — Ambulatory Visit (HOSPITAL_COMMUNITY): Payer: Medicaid Other

## 2014-06-04 NOTE — Telephone Encounter (Signed)
Tried home and mobile LVM for pt to return call to inform him of below. Harold Colon, April D, [place]

## 2014-06-25 ENCOUNTER — Ambulatory Visit: Payer: Medicaid Other | Admitting: Family Medicine

## 2014-08-20 ENCOUNTER — Emergency Department (HOSPITAL_COMMUNITY)
Admission: EM | Admit: 2014-08-20 | Discharge: 2014-08-20 | Disposition: A | Discharge status: Home / Self Care | Payer: Medicaid Other | Source: Home / Self Care | Attending: Family Medicine | Admitting: Family Medicine

## 2014-08-20 ENCOUNTER — Encounter (HOSPITAL_COMMUNITY): Payer: Self-pay | Admitting: *Deleted

## 2014-08-20 DIAGNOSIS — M25561 Pain in right knee: Secondary | ICD-10-CM

## 2014-08-20 DIAGNOSIS — M25562 Pain in left knee: Secondary | ICD-10-CM

## 2014-08-20 MED ORDER — DICLOFENAC SODIUM 1 % TD GEL: 4.0000 g | Freq: Four times a day (QID) | TRANSDERMAL | Status: DC

## 2014-08-20 NOTE — Discharge Instructions (Signed)
Use medicine and ice 4 times a day over weekend , see dr Gwendolyn Grant on mon .

## 2014-08-20 NOTE — ED Notes (Signed)
Pt  Has  Bilateral knee  Pain  That  He  Says  Is  Chronic  He  States  He  Was  Told  He  Would  Need  Knee  Surgery     He  Sates  He  Gets  Cortisone  Shots   But     The  Office  Was closed     He  denys  Any  Recent injury    He  States  The  Right  Is  Worse  Than the  Left   He  denys  Any  Recent injury
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
## 2014-09-15 ENCOUNTER — Telehealth: Payer: Self-pay | Admitting: Family Medicine

## 2014-09-15 NOTE — Telephone Encounter (Signed)
Patient requests refill for Hydrocodone 5-325 mg. Please, follow up.

## 2014-09-16 NOTE — Telephone Encounter (Signed)
He would need an appointment to address the reason for this.  I can't see that I've ever prescribed him this.

## 2014-10-01 ENCOUNTER — Ambulatory Visit (INDEPENDENT_AMBULATORY_CARE_PROVIDER_SITE_OTHER): Payer: Medicaid Other | Admitting: Family Medicine

## 2014-10-01 ENCOUNTER — Encounter: Payer: Self-pay | Admitting: Family Medicine

## 2014-10-01 VITALS — BP 141/87 | HR 72 | Temp 98.1°F | Ht 73.0 in | Wt 249.0 lb

## 2014-10-01 DIAGNOSIS — M25561 Pain in right knee: Secondary | ICD-10-CM

## 2014-10-01 DIAGNOSIS — G8929 Other chronic pain: Secondary | ICD-10-CM

## 2014-10-01 DIAGNOSIS — M25562 Pain in left knee: Secondary | ICD-10-CM | POA: Diagnosis not present

## 2014-10-01 DIAGNOSIS — M542 Cervicalgia: Secondary | ICD-10-CM

## 2014-10-01 MED ORDER — DICLOFENAC SODIUM 1 % TD GEL: 4.0000 g | Freq: Four times a day (QID) | TRANSDERMAL | Status: DC

## 2014-10-01 MED ORDER — NICOTINE 21 MG/24HR TD PT24: 21.0000 mg | MEDICATED_PATCH | Freq: Every day | TRANSDERMAL | Status: DC

## 2014-10-01 MED ORDER — TRAMADOL HCL 50 MG PO TABS: 50.0000 mg | ORAL_TABLET | Freq: Three times a day (TID) | ORAL | Status: DC | PRN

## 2014-10-01 NOTE — Patient Instructions (Addendum)
I'll refer you to both a neck specialist and knee specialist.  Keep as active as possible.  This includes moving and stretching your neck like we discussed.  Ultram for pain relief.  Voltaren gel for relief.    It was good to see you today
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
## 2014-10-01 NOTE — Assessment & Plan Note (Signed)
Patient desires referral to neurosurgery for further evaluation of his neck. Tramadol for pain relief to help with daily ADLs plus physical therapy.   Will place referral today.

## 2014-10-01 NOTE — Assessment & Plan Note (Signed)
Chronic.   Tramadol and voltaren gel to help with physical activity.  Limits ability to work.  Will refer to Orthopedics for further evaluation.

## 2014-10-14 NOTE — Telephone Encounter (Signed)
Pt had appointment with PCP on 9/23. Lamonte Sakai, April D, [place]

## 2014-11-19 ENCOUNTER — Ambulatory Visit (INDEPENDENT_AMBULATORY_CARE_PROVIDER_SITE_OTHER): Payer: Medicaid Other | Admitting: Family Medicine

## 2014-11-19 ENCOUNTER — Encounter: Payer: Self-pay | Admitting: Family Medicine

## 2014-11-19 VITALS — BP 135/75 | HR 71 | Temp 98.5°F | Ht 73.0 in | Wt 252.7 lb

## 2014-11-19 DIAGNOSIS — J302 Other seasonal allergic rhinitis: Secondary | ICD-10-CM | POA: Diagnosis not present

## 2014-11-19 DIAGNOSIS — M4806 Spinal stenosis, lumbar region: Secondary | ICD-10-CM

## 2014-11-19 DIAGNOSIS — M25561 Pain in right knee: Secondary | ICD-10-CM

## 2014-11-19 DIAGNOSIS — G8929 Other chronic pain: Secondary | ICD-10-CM

## 2014-11-19 DIAGNOSIS — M542 Cervicalgia: Secondary | ICD-10-CM

## 2014-11-19 DIAGNOSIS — M48061 Spinal stenosis, lumbar region without neurogenic claudication: Secondary | ICD-10-CM

## 2014-11-19 DIAGNOSIS — M25562 Pain in left knee: Secondary | ICD-10-CM | POA: Diagnosis not present

## 2014-11-19 MED ORDER — CETIRIZINE HCL 10 MG PO TABS: 10.0000 mg | ORAL_TABLET | Freq: Every day | ORAL | Status: DC

## 2014-11-19 MED ORDER — NAPROXEN 500 MG PO TABS: 500.0000 mg | ORAL_TABLET | Freq: Two times a day (BID) | ORAL | Status: DC

## 2014-11-19 MED ORDER — TRAMADOL HCL 50 MG PO TABS: 100.0000 mg | ORAL_TABLET | Freq: Three times a day (TID) | ORAL | Status: DC | PRN

## 2014-11-19 NOTE — Assessment & Plan Note (Signed)
Persists. We'll recommend physical therapy. Heat and massage. Continue tramadol for pain relief. Patient is asking for stronger but we'll continue tramadol for now.

## 2014-11-19 NOTE — Assessment & Plan Note (Addendum)
New problem for patient Most likely etiology for symptoms. Trial of cetirizine. Follow-up as needed.

## 2014-11-19 NOTE — Patient Instructions (Signed)
Take the Naproxen scheduled once in the AM and once in the PM.    Take the Tramadol two pills, up to every 6 - 8 hours if you need it.  Contact the Orthopedist for an appointment.   Take the Cetirizine for your allergies, one pill in the AM.    I will refer you to a physical therapist.

## 2014-11-19 NOTE — Assessment & Plan Note (Signed)
He is likely reaching the point that he will need a knee replacement versus simple arthroscopy. As noted in history of present illness patient is trying to put off surgery as long as possible. Continue being as active as possible strength and leg muscles around his knee.
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
## 2014-11-19 NOTE — Assessment & Plan Note (Signed)
He is to remain as active as possible. Physical therapy to help with pain in lower back. No red flags.

## 2014-12-13 ENCOUNTER — Ambulatory Visit (INDEPENDENT_AMBULATORY_CARE_PROVIDER_SITE_OTHER): Payer: Medicaid Other | Admitting: Student

## 2014-12-13 ENCOUNTER — Encounter: Payer: Self-pay | Admitting: Student

## 2014-12-13 VITALS — BP 116/64 | HR 76 | Temp 98.1°F | Wt 257.0 lb

## 2014-12-13 DIAGNOSIS — L308 Other specified dermatitis: Secondary | ICD-10-CM

## 2014-12-13 DIAGNOSIS — R21 Rash and other nonspecific skin eruption: Secondary | ICD-10-CM | POA: Diagnosis present

## 2014-12-13 MED ORDER — CLOBETASOL PROPIONATE 0.05 % EX OINT: 1.0000 "application " | TOPICAL_OINTMENT | Freq: Two times a day (BID) | CUTANEOUS | Status: DC

## 2014-12-13 MED ORDER — TRIAMCINOLONE ACETONIDE 0.025 % EX OINT: 1.0000 "application " | TOPICAL_OINTMENT | Freq: Two times a day (BID) | CUTANEOUS | Status: DC

## 2014-12-13 NOTE — Assessment & Plan Note (Signed)
Rash on abdomen - Rash on abdomen consistent with eczema, will treat with triamcinolone cream

## 2014-12-13 NOTE — Patient Instructions (Addendum)
Follow up in 2 weeks for rash You were prescribed triamcinolone for eczema, and rash of your back.  If you have questions or concerns, call the office at [phone]
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
## 2014-12-13 NOTE — Assessment & Plan Note (Signed)
Previously treated with steroid cream with good result - Will start with triamcinolone therapy and increase in potency of steroid cream as needed

## 2014-12-24 ENCOUNTER — Ambulatory Visit (INDEPENDENT_AMBULATORY_CARE_PROVIDER_SITE_OTHER): Payer: Medicaid Other | Admitting: Family Medicine

## 2014-12-24 ENCOUNTER — Encounter: Payer: Self-pay | Admitting: Family Medicine

## 2014-12-24 VITALS — BP 134/82 | HR 85 | Temp 98.6°F | Ht 73.0 in | Wt 252.0 lb

## 2014-12-24 DIAGNOSIS — G8929 Other chronic pain: Secondary | ICD-10-CM

## 2014-12-24 DIAGNOSIS — Z1159 Encounter for screening for other viral diseases: Secondary | ICD-10-CM | POA: Diagnosis not present

## 2014-12-24 DIAGNOSIS — Z23 Encounter for immunization: Secondary | ICD-10-CM

## 2014-12-24 DIAGNOSIS — M542 Cervicalgia: Secondary | ICD-10-CM

## 2014-12-24 DIAGNOSIS — L308 Other specified dermatitis: Secondary | ICD-10-CM | POA: Diagnosis not present

## 2014-12-24 DIAGNOSIS — E785 Hyperlipidemia, unspecified: Secondary | ICD-10-CM

## 2014-12-24 DIAGNOSIS — G4733 Obstructive sleep apnea (adult) (pediatric): Secondary | ICD-10-CM

## 2014-12-24 DIAGNOSIS — Z720 Tobacco use: Secondary | ICD-10-CM | POA: Diagnosis not present

## 2014-12-24 LAB — LIPID PANEL
Cholesterol: 190 mg/dL (ref 125–200)
HDL: 55 mg/dL (ref >=40)
LDL CALC: 114 mg/dL (ref <130)
Total CHOL/HDL Ratio: 3.5 Ratio (ref <=5.0)
Triglycerides: 105 mg/dL (ref <150)
VLDL: 21 mg/dL (ref <30)

## 2014-12-24 MED ORDER — TRAMADOL HCL 50 MG PO TABS: 100.0000 mg | ORAL_TABLET | Freq: Three times a day (TID) | ORAL | Status: DC | PRN

## 2014-12-24 MED ORDER — NAPROXEN 500 MG PO TABS: 500.0000 mg | ORAL_TABLET | Freq: Two times a day (BID) | ORAL | Status: DC

## 2014-12-24 NOTE — Assessment & Plan Note (Signed)
Completed paperwork for new CPAP mask

## 2014-12-24 NOTE — Assessment & Plan Note (Signed)
Counseled to quit completely 

## 2014-12-24 NOTE — Assessment & Plan Note (Signed)
Continue triamcinolone cream as this is helping. At increased dose.

## 2014-12-24 NOTE — Assessment & Plan Note (Signed)
Continue tramadol. Continue tramadol. Continue naproxen. Referral for physical therapy.

## 2014-12-24 NOTE — Patient Instructions (Addendum)
It was good to see you today.  Things look good overall.  Let me know about quitting smoking.  I can have you seen Dr. Raymondo BandKoval our pharmacist.    Refill for Tramadol and Naproxen today.   I will let you know your lab results.   Come back and see me in the next 3 - 6 months to see how your neck pain is doing.    Merry Christmas
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
## 2014-12-24 NOTE — Assessment & Plan Note (Signed)
Present in 2012. Rechecking lipids today.

## 2014-12-25 LAB — HEPATITIS C ANTIBODY: HCV Ab: NEGATIVE

## 2015-02-07 ENCOUNTER — Ambulatory Visit (INDEPENDENT_AMBULATORY_CARE_PROVIDER_SITE_OTHER): Payer: Medicaid Other | Admitting: Family Medicine

## 2015-02-07 ENCOUNTER — Encounter: Payer: Self-pay | Admitting: Family Medicine

## 2015-02-07 VITALS — BP 138/97 | HR 91 | Temp 98.8°F | Ht 73.0 in | Wt 255.0 lb

## 2015-02-07 DIAGNOSIS — J209 Acute bronchitis, unspecified: Secondary | ICD-10-CM

## 2015-02-07 NOTE — Patient Instructions (Signed)
Thank you so much for coming to visit today! I suspect your symptoms should continue to improve. If you stop improving or start getting worse, please return and we will consider antibiotics. Sometimes cough can take several weeks to go away completely.  Thanks again! Dr. Caroleen Hamman

## 2015-02-08 NOTE — Progress Notes (Signed)
Subjective:     Patient ID: Harold Colon, male   DOB: 1955/02/06, 60 y.o.   MRN: [id number]  HPI Harold Colon is a 60yo male presenting today for cold. - Reports chest congestion, productive cough, rib pain with coughing, nausea initially (currently resolved) - Denies fever, vomiting, diarrhea, headache - Sick contact: Ex with similar symptoms - Has tried Alka Seltzer +, Tussin DM with some relief - Has gradually been improving - Flu shot  Review of Systems Per HPI. Other systems negative.    Objective:   Physical Exam  Constitutional: He appears well-developed and well-nourished. No distress.  HENT:  Head: Normocephalic and atraumatic.  Mouth/Throat: Oropharynx is clear and moist.  Eyes: Pupils are equal, round, and reactive to light.  Cardiovascular: Normal rate and regular rhythm.  Exam reveals no gallop and no friction rub.   No murmur heard. Pulmonary/Chest: No respiratory distress. He has no wheezes. He has no rales.  Musculoskeletal: He exhibits no edema.  Lymphadenopathy:    He has no cervical adenopathy.  Psychiatric: He has a normal mood and affect. His behavior is normal.       Assessment and Plan:     1. Acute bronchitis, unspecified organism - Has been improving. Continue symptomatic management OTC. - Flu unlikely. No muscle aches, headaches, fevers. Has had flu shot. - Follow up if no improvement

## 2015-02-17 ENCOUNTER — Ambulatory Visit: Payer: Medicaid Other | Attending: Family Medicine | Admitting: Physical Therapy

## 2015-02-17 ENCOUNTER — Encounter: Payer: Self-pay | Admitting: Physical Therapy

## 2015-02-17 DIAGNOSIS — M545 Low back pain, unspecified: Secondary | ICD-10-CM

## 2015-02-17 DIAGNOSIS — M2560 Stiffness of unspecified joint, not elsewhere classified: Secondary | ICD-10-CM | POA: Diagnosis present

## 2015-02-17 DIAGNOSIS — R29898 Other symptoms and signs involving the musculoskeletal system: Secondary | ICD-10-CM | POA: Diagnosis present

## 2015-02-17 DIAGNOSIS — M5386 Other specified dorsopathies, lumbar region: Secondary | ICD-10-CM
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
## 2015-03-12 ENCOUNTER — Emergency Department (HOSPITAL_COMMUNITY): Payer: Medicaid Other

## 2015-03-12 ENCOUNTER — Encounter (HOSPITAL_COMMUNITY): Payer: Self-pay | Admitting: Emergency Medicine

## 2015-03-12 ENCOUNTER — Emergency Department (HOSPITAL_COMMUNITY)
Admission: EM | Admit: 2015-03-12 | Discharge: 2015-03-12 | Disposition: A | Discharge status: Home / Self Care | Payer: Medicaid Other | Attending: Emergency Medicine | Admitting: Emergency Medicine

## 2015-03-12 DIAGNOSIS — Y9389 Activity, other specified: Secondary | ICD-10-CM | POA: Diagnosis not present

## 2015-03-12 DIAGNOSIS — F17210 Nicotine dependence, cigarettes, uncomplicated: Secondary | ICD-10-CM | POA: Insufficient documentation

## 2015-03-12 DIAGNOSIS — R600 Localized edema: Secondary | ICD-10-CM | POA: Diagnosis not present

## 2015-03-12 DIAGNOSIS — Y9289 Other specified places as the place of occurrence of the external cause: Secondary | ICD-10-CM | POA: Insufficient documentation

## 2015-03-12 DIAGNOSIS — M1990 Unspecified osteoarthritis, unspecified site: Secondary | ICD-10-CM | POA: Diagnosis not present

## 2015-03-12 DIAGNOSIS — Z87828 Personal history of other (healed) physical injury and trauma: Secondary | ICD-10-CM | POA: Insufficient documentation

## 2015-03-12 DIAGNOSIS — S4992XA Unspecified injury of left shoulder and upper arm, initial encounter: Secondary | ICD-10-CM | POA: Insufficient documentation

## 2015-03-12 DIAGNOSIS — R0602 Shortness of breath: Secondary | ICD-10-CM | POA: Diagnosis not present

## 2015-03-12 DIAGNOSIS — Z79899 Other long term (current) drug therapy: Secondary | ICD-10-CM | POA: Insufficient documentation

## 2015-03-12 DIAGNOSIS — G8929 Other chronic pain: Secondary | ICD-10-CM | POA: Diagnosis not present

## 2015-03-12 DIAGNOSIS — Z7982 Long term (current) use of aspirin: Secondary | ICD-10-CM | POA: Insufficient documentation

## 2015-03-12 DIAGNOSIS — Z7952 Long term (current) use of systemic steroids: Secondary | ICD-10-CM | POA: Diagnosis not present

## 2015-03-12 DIAGNOSIS — R55 Syncope and collapse: Secondary | ICD-10-CM | POA: Insufficient documentation

## 2015-03-12 DIAGNOSIS — W1839XA Other fall on same level, initial encounter: Secondary | ICD-10-CM | POA: Diagnosis not present

## 2015-03-12 DIAGNOSIS — Z791 Long term (current) use of non-steroidal anti-inflammatories (NSAID): Secondary | ICD-10-CM | POA: Diagnosis not present

## 2015-03-12 DIAGNOSIS — Y998 Other external cause status: Secondary | ICD-10-CM | POA: Insufficient documentation

## 2015-03-12 DIAGNOSIS — Z8614 Personal history of Methicillin resistant Staphylococcus aureus infection: Secondary | ICD-10-CM | POA: Diagnosis not present

## 2015-03-12 DIAGNOSIS — Z8619 Personal history of other infectious and parasitic diseases: Secondary | ICD-10-CM | POA: Diagnosis not present

## 2015-03-12 LAB — CBC
HEMATOCRIT: 42.6 % (ref 39.0–52.0)
Hemoglobin: 14.2 g/dL (ref 13.0–17.0)
MCH: 30.5 pg (ref 26.0–34.0)
MCHC: 33.3 g/dL (ref 30.0–36.0)
MCV: 91.4 fL (ref 78.0–100.0)
Platelets: 174 10*3/uL (ref 150–400)
RBC: 4.66 MIL/uL (ref 4.22–5.81)
RDW: 13.2 % (ref 11.5–15.5)
WBC: 5.6 10*3/uL (ref 4.0–10.5)

## 2015-03-12 LAB — I-STAT TROPONIN, ED: Troponin i, poc: 0.01 ng/mL (ref 0.00–0.08)

## 2015-03-12 LAB — BASIC METABOLIC PANEL
Anion gap: 9 (ref 5–15)
BUN: 12 mg/dL (ref 6–20)
CHLORIDE: 108 mmol/L (ref 101–111)
CO2: 26 mmol/L (ref 22–32)
Calcium: 9.3 mg/dL (ref 8.9–10.3)
Creatinine, Ser: 0.8 mg/dL (ref 0.61–1.24)
GFR calc Af Amer: >60 mL/min (ref >60)
GFR calc non Af Amer: >60 mL/min (ref >60)
Glucose, Bld: 108 mg/dL — ABNORMAL HIGH (ref 65–99)
Potassium: 4 mmol/L (ref 3.5–5.1)
SODIUM: 143 mmol/L (ref 135–145)

## 2015-03-12 NOTE — ED Notes (Signed)
Patient transported to X-ray 

## 2015-03-12 NOTE — Discharge Instructions (Signed)
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
## 2015-03-12 NOTE — ED Notes (Signed)
Pt ambulated unassisted with quick steady gait to the room from the lobby.  Pt changing into a gown.

## 2015-03-12 NOTE — ED Notes (Signed)
Patient has returned from being out of the department; patient placed back on monitor, continuous pulse oximetry and blood pressure cuff 

## 2015-03-12 NOTE — ED Notes (Addendum)
Pt from home with c/o near syncope this past Wed evening.  Pt reports he sleeps on the floor and had bent over to adjust his bed matt when he became weak enough that he went to his knees and had some left arm pain.  Pt reports mild dizziness at the time.  Pt was able to get his CPAP mask on and lay down after.  Pt denies any events of the same in the past or since.  Reports generalized fatigue.  Hx angina.  NAD, A&O.

## 2015-04-12 ENCOUNTER — Encounter: Payer: Self-pay | Admitting: Family Medicine

## 2015-04-12 ENCOUNTER — Ambulatory Visit (INDEPENDENT_AMBULATORY_CARE_PROVIDER_SITE_OTHER): Payer: Medicaid Other | Admitting: Family Medicine

## 2015-04-12 VITALS — BP 140/77 | HR 90 | Temp 98.7°F | Ht 73.0 in | Wt 256.7 lb

## 2015-04-12 DIAGNOSIS — K625 Hemorrhage of anus and rectum: Secondary | ICD-10-CM

## 2015-04-12 DIAGNOSIS — K922 Gastrointestinal hemorrhage, unspecified: Secondary | ICD-10-CM

## 2015-04-12 DIAGNOSIS — R21 Rash and other nonspecific skin eruption: Secondary | ICD-10-CM

## 2015-04-12 LAB — CBC
HCT: 44.6 % (ref 38.5–50.0)
HEMOGLOBIN: 15.7 g/dL (ref 13.2–17.1)
MCH: 32.3 pg (ref 27.0–33.0)
MCHC: 35.2 g/dL (ref 32.0–36.0)
MCV: 91.8 fL (ref 80.0–100.0)
MPV: 11.6 fL (ref 7.5–12.5)
Platelets: 202 10*3/uL (ref 140–400)
RBC: 4.86 MIL/uL (ref 4.20–5.80)
RDW: 14.1 % (ref 11.0–15.0)
WBC: 6.7 10*3/uL (ref 3.8–10.8)

## 2015-04-12 MED ORDER — TRIAMCINOLONE ACETONIDE 0.025 % EX OINT: 1.0000 "application " | TOPICAL_OINTMENT | Freq: Two times a day (BID) | CUTANEOUS | Status: DC

## 2015-04-12 NOTE — Progress Notes (Signed)
Date of Visit: 04/12/2015   HPI:  Patient presents for a same day appointment to discuss skin issues. He originally scheduled it for "weakenss and dizziness ED follow up" but when advised that as a same day appointment we needed to stick with one issue, he wanted to focus on skin problems.  Was seen in ED about a month ago for presyncopal episode. Has had NO further dizziness or weakness since that visit.  Skin issues - has rash on side and back. Itchy. Has been given rx for kenalog 0.025% ointment in the past which has helped. Ran out of it last month and needs new refill. Previously had skin biopsy which showed "perivascular dermatitis".  At very end of visit as I was handing patient his after visit summary, he also reported having had rectal bleeding. Reports having had this in the past and had colonoscopy for it. Resumed last week. Had 2 episodes then, none since. Bleeding was dark red. Denies chest pain or shortness of breath. No dizziness. Had some rectal pain at that time last week. Has daily bowel movement.   ROS: See HPI  PMFSH: history of chronic back pain, prior GI bleed, hyperlipidemia, severe OSA, perivascular dermatitis, tobacco abuse  PHYSICAL EXAM: BP 140/77 mmHg  Pulse 90  Temp(Src) 98.7 F (37.1 C) (Oral)  Ht 6\' 1"  (1.854 m)  Wt 256 lb 11.2 oz (116.438 kg)  BMI 33.87 kg/m2 Gen: NAD, pleasant, cooperative, well appearing HEENT: normocephalic, atraumatic, moist mucous membranes  Heart: regular rate and rhythm, no murmur Lungs: clear to auscultation bilaterally, normal work of breathing  Abdomen: soft, nontender to palpation, no masses or organomegaly Neuro: alert, grossly nonfocal, speech normal Skin: scattered hyperpigmented 1cm macules on flank and back, one area of larger 3cm hyperpigmentation on mid upper back with some scaling  ASSESSMENT/PLAN:  GI bleed Reported at very end of visit, self limited occuring twice last week. Colonoscopy report reviewed, which  showed internal and external hemorrhoids Report is "dark red" blood, could be upper GI in origin. Will check labs today (CBC, CMET, coags) to ensure stability.  No further bleeding, currently hemodynamically stable and well appearing. Defer rectal exam today.  Follow up with PCP for this. Educated patient on needing to bring up issues like GI bleeding at the beginning of visit.   Rash Patient reports present outbreak of rash is consistent with his prior rash Refill of triamcinolone Follow up with PCP      FOLLOW UP: Follow up in next few weeks with PCP for above issues  GrenadaBrittany J. Pollie MeyerMcIntyre, MD [facility]

## 2015-04-12 NOTE — Patient Instructions (Signed)
I refilled your triamcinolone Please follow up with Dr. Gwendolyn GrantWalden for the rash  Be well, Dr. Pollie MeyerMcIntyre

## 2015-04-13 LAB — COMPREHENSIVE METABOLIC PANEL
ALBUMIN: 4 g/dL (ref 3.6–5.1)
ALT: 22 U/L (ref 9–46)
AST: 18 U/L (ref 10–35)
Alkaline Phosphatase: 70 U/L (ref 40–115)
BUN: 15 mg/dL (ref 7–25)
CALCIUM: 9.5 mg/dL (ref 8.6–10.3)
CHLORIDE: 103 mmol/L (ref 98–110)
CO2: 27 mmol/L (ref 20–31)
Creat: 0.87 mg/dL (ref 0.70–1.33)
GLUCOSE: 90 mg/dL (ref 65–99)
Potassium: 4.2 mmol/L (ref 3.5–5.3)
Sodium: 137 mmol/L (ref 135–146)
Total Bilirubin: 0.3 mg/dL (ref 0.2–1.2)
Total Protein: 7.4 g/dL (ref 6.1–8.1)

## 2015-04-13 LAB — PROTIME-INR
INR: 1 (ref <1.50)
PROTHROMBIN TIME: 13.3 s (ref 11.6–15.2)

## 2015-04-13 NOTE — Assessment & Plan Note (Addendum)
Reported at very end of visit, self limited occuring twice last week. Colonoscopy report reviewed, which showed internal and external hemorrhoids Report is "dark red" blood, could be upper GI in origin. Will check labs today (CBC, CMET, coags) to ensure stability.  No further bleeding, currently hemodynamically stable and well appearing. Defer rectal exam today.  Follow up with PCP for this. Educated patient on needing to bring up issues like GI bleeding at the beginning of visit.

## 2015-04-13 NOTE — Assessment & Plan Note (Signed)
Patient reports present outbreak of rash is consistent with his prior rash Refill of triamcinolone Follow up with PCP

## 2015-04-14 ENCOUNTER — Encounter: Payer: Self-pay | Admitting: Family Medicine

## 2015-04-29 ENCOUNTER — Telehealth: Payer: Self-pay | Admitting: *Deleted

## 2015-04-29 DIAGNOSIS — M25562 Pain in left knee: Principal | ICD-10-CM

## 2015-04-29 DIAGNOSIS — M25561 Pain in right knee: Secondary | ICD-10-CM

## 2015-04-29 NOTE — Telephone Encounter (Signed)
Patient calling requesting an updated referral referral to ortho for his knee pain. Will forward to MD

## 2015-05-03 NOTE — Telephone Encounter (Signed)
I had asked patient to call if pain in knees continued and he re-though decision on surgery.  He has obviously done so.  Will place referral today.

## 2015-05-06 NOTE — Telephone Encounter (Signed)
FYI, [facility] has tried multiple times to contact patient to schedule him. No answer/no callback. If patient does call back, please give him [facility] # [phone]

## 2015-05-31 ENCOUNTER — Ambulatory Visit (INDEPENDENT_AMBULATORY_CARE_PROVIDER_SITE_OTHER): Payer: Medicaid Other | Admitting: Family Medicine

## 2015-05-31 ENCOUNTER — Encounter: Payer: Self-pay | Admitting: Family Medicine

## 2015-05-31 VITALS — BP 132/89 | HR 68 | Temp 98.3°F | Ht 73.0 in | Wt 263.0 lb

## 2015-05-31 DIAGNOSIS — M25561 Pain in right knee: Secondary | ICD-10-CM | POA: Diagnosis not present

## 2015-05-31 DIAGNOSIS — M25562 Pain in left knee: Secondary | ICD-10-CM | POA: Diagnosis not present

## 2015-05-31 DIAGNOSIS — R21 Rash and other nonspecific skin eruption: Secondary | ICD-10-CM | POA: Diagnosis not present

## 2015-05-31 MED ORDER — TRAMADOL HCL 50 MG PO TABS: 100.0000 mg | ORAL_TABLET | Freq: Three times a day (TID) | ORAL | Status: DC | PRN

## 2015-05-31 MED ORDER — TERBINAFINE HCL 250 MG PO TABS: 250.0000 mg | ORAL_TABLET | Freq: Every day | ORAL | Status: DC

## 2015-05-31 NOTE — Assessment & Plan Note (Signed)
Refill for lamisil.  He has been tested for LFTs and this have been fine.

## 2015-05-31 NOTE — Patient Instructions (Signed)
I have sent in the Terbinafine for your rash.  This is a pill.  Take it for 3 months and it should clear up much of your rash.  [facility].    Refill for pain medications.  It was good to see you again today.  Congratulations on the upcoming wedding!

## 2015-05-31 NOTE — Assessment & Plan Note (Signed)
Record release signed. He is ready for surgery.  Discussed knee/leg strenthening exercises he should start today to help with this.  Motivated
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
## 2015-06-16 ENCOUNTER — Ambulatory Visit (INDEPENDENT_AMBULATORY_CARE_PROVIDER_SITE_OTHER): Payer: Medicaid Other | Admitting: Internal Medicine

## 2015-06-16 ENCOUNTER — Encounter: Payer: Self-pay | Admitting: Internal Medicine

## 2015-06-16 VITALS — BP 140/85 | HR 65 | Temp 98.2°F | Ht 73.0 in | Wt 264.8 lb

## 2015-06-16 DIAGNOSIS — J029 Acute pharyngitis, unspecified: Secondary | ICD-10-CM | POA: Diagnosis present

## 2015-06-16 NOTE — Progress Notes (Signed)
   Subjective:    Patient ID: Harold Colon, male    DOB: 01-07-1956, 60 y.o.   MRN: [id number]  HPI  Patient presents for same day visit for sore throat.   Sore throat Began 1.5 weeks ago. Has been worsening. Some soreness with swallowing, though has been able to eat and drink normally. Has used Chloraseptic spray with temporary symptomatic relief. Denies fever, chills, cough, rhinorrhea, HA, running/watery eyes. Sick contacts include his son who was sick with a sore throat about a month ago. Patient also reports that he has been singing more recently as he joined his church choir two months ago.  Patient is current every day smoker.   Review of Systems See HPI.     Objective:   Physical Exam  Constitutional: He is oriented to person, place, and time. He appears well-developed and well-nourished. No distress.  HENT:  Head: Normocephalic and atraumatic.  Nose: Nose normal.  Mouth/Throat: No oropharyngeal exudate.  Oropharyngeal erythema  Neck: Neck supple.  Cardiovascular: Normal rate, regular rhythm and normal heart sounds.   No murmur heard. Pulmonary/Chest: Effort normal and breath sounds normal. No stridor. No respiratory distress. He has no wheezes.  Lymphadenopathy:    He has no cervical adenopathy.  Neurological: He is alert and oriented to person, place, and time.  Psychiatric: He has a normal mood and affect. His behavior is normal.      Assessment & Plan:  Sore throat Mild oropharyngeal erythema, no tonsillar exudates. Centor score of 1 (5-10% likelihood of strep, with no additional testing or antibiotics indicated). Positive sick contact (son with viral sore throat). Likely viral etiology.  - Recommended continuing Chloraseptic, and using cough drops and drinking warm beverages. Provided handout with other additional non-prescription treatments - Return if not improved in 2 weeks   Tarri AbernethyAbigail J Heily Carlucci, MD PGY-1 [facility] Pager [phone]

## 2015-06-16 NOTE — Assessment & Plan Note (Signed)
Mild oropharyngeal erythema, no tonsillar exudates. Centor score of 1 (5-10% likelihood of strep, with no additional testing or antibiotics indicated). Positive sick contact (son with viral sore throat). Likely viral etiology.  - Recommended continuing Chloraseptic, and using cough drops and drinking warm beverages. Provided handout with other additional non-prescription treatments - Return if not improved in 2 weeks

## 2015-06-16 NOTE — Patient Instructions (Addendum)
It was nice meeting you today Mr. Harold Colon.   For your sore throat, you can use cough drops and drink honey with warm tea 2-3 times a day. You can continue to use the Chloraseptic spray and eat soups.   If you have not improved in two weeks, please call to schedule another appointment.   Be well,  Dr. Natale MilchLancaster  Sore Throat A sore throat is a painful, burning, sore, or scratchy feeling of the throat. There may be pain or tenderness when swallowing or talking. You may have other symptoms with a sore throat. These include coughing, sneezing, fever, or a swollen neck. A sore throat is often the first sign of another sickness. These sicknesses may include a cold, flu, strep throat, or an infection called mono. Most sore throats go away without medical treatment.  HOME CARE   Only take medicine as told by your doctor.  Drink enough fluids to keep your pee (urine) clear or pale yellow.  Rest as needed.  Try using throat sprays, lozenges, or suck on hard candy (if older than 4 years or as told).  Sip warm liquids, such as broth, herbal tea, or warm water with honey. Try sucking on frozen ice pops or drinking cold liquids.  Rinse the mouth (gargle) with salt water. Mix 1 teaspoon salt with 8 ounces of water.  Do not smoke. Avoid being around others when they are smoking.  Put a humidifier in your bedroom at night to moisten the air. You can also turn on a hot shower and sit in the bathroom for 5-10 minutes. Be sure the bathroom door is closed. GET HELP RIGHT AWAY IF:   You have trouble breathing.  You cannot swallow fluids, soft foods, or your spit (saliva).  You have more puffiness (swelling) in the throat.  Your sore throat does not get better in 7 days.  You feel sick to your stomach (nauseous) and throw up (vomit).  You have a fever or lasting symptoms for more than 2-3 days.  You have a fever and your symptoms suddenly get worse. MAKE SURE YOU:   Understand these  instructions.  Will watch your condition.  Will get help right away if you are not doing well or get worse.   This information is not intended to replace advice given to you by your health care provider. Make sure you discuss any questions you have with your health care provider.   Document Released: 10/04/2007 Document Revised: 09/19/2011 Document Reviewed: 09/02/2011 Elsevier Interactive Patient Education [employer] Elsevier Inc.

## 2015-07-28 ENCOUNTER — Encounter: Payer: Self-pay | Admitting: Student in an Organized Health Care Education/Training Program

## 2015-07-28 ENCOUNTER — Ambulatory Visit (INDEPENDENT_AMBULATORY_CARE_PROVIDER_SITE_OTHER): Payer: Medicaid Other | Admitting: Student in an Organized Health Care Education/Training Program

## 2015-07-28 VITALS — BP 124/80 | HR 67 | Temp 98.3°F | Ht 73.0 in | Wt 263.4 lb

## 2015-07-28 DIAGNOSIS — G8929 Other chronic pain: Secondary | ICD-10-CM | POA: Diagnosis not present

## 2015-07-28 DIAGNOSIS — M549 Dorsalgia, unspecified: Secondary | ICD-10-CM | POA: Diagnosis not present

## 2015-07-28 DIAGNOSIS — Z23 Encounter for immunization: Secondary | ICD-10-CM | POA: Diagnosis not present

## 2015-07-28 MED ORDER — NAPROXEN 500 MG PO TABS: 500.0000 mg | ORAL_TABLET | Freq: Two times a day (BID) | ORAL | Status: DC

## 2015-07-28 MED ORDER — TRAMADOL HCL 50 MG PO TABS: 100.0000 mg | ORAL_TABLET | Freq: Three times a day (TID) | ORAL | Status: DC | PRN

## 2015-07-28 NOTE — Progress Notes (Signed)
   CC: back pain  HPI: Harold Colon is a 60 y.o. male with history of moderate spinal stenosis who [facility] today for a pain medication refill.  Patient reports the pain is consistent with his usual back pain symptoms, although he feels his medications are becoming less effective in controlling his symptoms. With medications he reports 6/10 pain that is described as sharp and worse with bending forward, and reportedly radiates to the chest when he leans forward.He denies radiation to the leg.  Denies pseudoclaudication. No fecal/urinary incontinence, no numbness or weakness, no fevers/chills/night sweats, no IVDA.  No history of chronic steroid use or osteoporosis.   He denies chest pain other than when he's bending forward, no dyspnea on exertion, palpitations or pressure in the chest   Review of Symptoms: See HPI for ROS. All other systems reviewed and are negative.   CC, SH/smoking status, and VS noted.  Objective: BP 124/80 mmHg  Pulse 67  Temp(Src) 98.3 F (36.8 C) (Oral)  Ht 6\' 1"  (1.854 m)  Wt 263 lb 6.4 oz (119.477 kg)  BMI 34.76 kg/m2 GEN: NAD, alert, cooperative, and pleasant. NECK: full ROM GU: No CVA tenderness BACK: +mild point tenderness L3-L5, no paraspinal tenderness SKIN: warm and dry, no rashes or lesions NEURO: II-XII grossly intact, normal gait, peripheral sensation intact, no focal deficits, 2+ DTR bilaterally PSYCH: AAOx3, appropriate affect  Assessment and plan:  Chronic back pain Pain is consistent with previous dx of spinal stenosis and disc generation.  Given that patient denies pseudoclaudication or sciatica and denies any red flag symptoms, further imaging/surgical evaluation is not indicated at this point. - Goals of pain management discussed at this visit; set the expectation that patient will always have to manage pain from multiple angles.   - Discussed advantages of exercise and weight loss on pain management  - Recommended low/no-impact  exercises such as water aerobics or stationary cycling, patient agreeable - will continue tramadol and naproxen as previously prescribed - referal to PT     Orders Placed This Encounter  Procedures  . Tdap vaccine greater than or equal to 7yo IM  . Ambulatory referral to Physical Therapy    Referral Priority:  Routine    Referral Type:  Physical Medicine    Referral Reason:  Specialty Services Required    Requested Specialty:  Physical Therapy    Number of Visits Requested:  1    Meds ordered this encounter  Medications  . naproxen (NAPROSYN) 500 MG tablet    Sig: Take 1 tablet (500 mg total) by mouth 2 (two) times daily with a meal.    Dispense:  30 tablet    Refill:  1  . traMADol (ULTRAM) 50 MG tablet    Sig: Take 2 tablets (100 mg total) by mouth every 8 (eight) hours as needed.    Dispense:  60 tablet    Refill:  1     Howard PouchLauren Mourad Cwikla, MD,MS,  PGY1 07/28/2015 4:32 PM

## 2015-07-28 NOTE — Assessment & Plan Note (Addendum)
Pain is consistent with previous dx of spinal stenosis and disc generation.  Given that patient denies pseudoclaudication or sciatica and denies any red flag symptoms, further imaging/surgical evaluation is not indicated at this point. - Goals of pain management discussed at this visit; set the expectation that patient will always have to manage pain from multiple angles.   - Discussed advantages of exercise and weight loss on pain management  - Recommended low/no-impact exercises such as water aerobics or stationary cycling, patient agreeable - will continue tramadol and naproxen as previously prescribed - referal to PT

## 2015-07-28 NOTE — Patient Instructions (Addendum)
It was a pleasure seeing you today in our clinic. Today we discussed your back pain. Here is the treatment plan we have discussed and agreed upon together:  - continue tramadol and naproxen - referal to PT - We talked about the benefits of exercise as well as weight loss on managing your pain.  Consider trying water aerobics or stationary cycling as these are low/no impact.  Follow up or call the office if your symptoms worsen. Our clinic's number is [phone].   - Dr. Mosetta PuttFeng

## 2015-09-05 ENCOUNTER — Ambulatory Visit (INDEPENDENT_AMBULATORY_CARE_PROVIDER_SITE_OTHER): Payer: Medicaid Other | Admitting: Internal Medicine

## 2015-09-05 ENCOUNTER — Encounter: Payer: Self-pay | Admitting: Internal Medicine

## 2015-09-05 DIAGNOSIS — Z23 Encounter for immunization: Secondary | ICD-10-CM

## 2015-09-05 DIAGNOSIS — R21 Rash and other nonspecific skin eruption: Secondary | ICD-10-CM

## 2015-09-05 MED ORDER — HYDROCORTISONE VALERATE 0.2 % EX OINT: 1.0000 "application " | TOPICAL_OINTMENT | Freq: Two times a day (BID) | CUTANEOUS | 0 refills | Status: AC

## 2015-09-05 NOTE — Assessment & Plan Note (Signed)
New dermatitis of hands and neck. Unsure of etiology. Appears similar to poison ivy dermatitis, however patient denying any exposure to plants. No new soaps, lotions, or use of chemicals. Will attempt treatment with topical steroid.  - Hydrocortisone topically to neck and hands BID - If no improvement by end of week, schedule another appointment - Benadryl q4-6 PRN itching

## 2015-09-05 NOTE — Progress Notes (Signed)
   Subjective:    Patient ID: Harold Colon, male    DOB: [birth year], 60 y.o.   MRN: [id number]  HPI  Patient presents for rash on hands and neck.   Rash Patient presents with rash x1 wk. Reports initially began on his hands, and now is present on his neck as well. Patient has a chronic skin condition on his back for which he uses triamcinolone, but says this is different. He has applied triamcinolone to his hands and said that it "dried out" the rash, but he is still reporting intense pruritis of his hands. Also reports that some of the lesions on his hands have been draining. He has not tried anything other than triamcinolone. Has not taken anything for itching.  Denies working outside or in any foliage recently. Denies new soaps, lotions, use of chemicals. Denies wearing gloves or any new fabrics recently.   Review of Systems See HPI.    Objective:   Physical Exam  Constitutional: He is oriented to person, place, and time. He appears well-developed and well-nourished.  HENT:  Head: Normocephalic and atraumatic.  Eyes: EOM are normal.  Pulmonary/Chest: Effort normal. No respiratory distress.  Neurological: He is alert and oriented to person, place, and time.  Skin: Skin is warm and dry.  Small vesicular rash present on both hands, primarily on fingers. One lesion on L third digit with crusted drainage, though not erythematous and does not appear infected. Similar but slightly larger lesions on patient's neck at hairline. Hyperpigmented macular rash throughout back.   Psychiatric: He has a normal mood and affect. His behavior is normal.      Assessment & Plan:  Rash New dermatitis of hands and neck. Unsure of etiology. Appears similar to poison ivy dermatitis, however patient denying any exposure to plants. No new soaps, lotions, or use of chemicals. Will attempt treatment with topical steroid.  - Hydrocortisone topically to neck and hands BID - If no improvement by end of week,  schedule another appointment - Benadryl q4-6 PRN itching  Tarri AbernethyAbigail J Amita Atayde, MD, MPH PGY-2 [facility] Pager [phone]

## 2015-09-05 NOTE — Patient Instructions (Signed)
It was nice seeing you again today Mr. Harold Colon!  Please begin applying the hydrocortisone cream to your hands and neck twice a day.   For itching, you can take two Benadryl tablets every 4-6 hours as needed.   If your rash has not improved or worsens by the end of the week, please call to schedule another appointment.   If you have any questions or concerns, please feel free to call the clinic.   Be well,  Dr. Natale MilchLancaster

## 2015-11-04 ENCOUNTER — Encounter: Payer: Self-pay | Admitting: *Deleted

## 2015-11-04 NOTE — Progress Notes (Signed)
Pt presents @ 4:10 requesting to see a provider.  He is requesting an appt for a rash that he has had for months.  After speaking with pt, I found out that he was unable to pick up the hydrocortisone cream that he was Rx in August because insurance would not cover it.  After speaking with Dr. Jennette KettleNeal advised that we could make appt for Monday and in the meantime he could pick up OTC hydrocortisone cream.  Pt agreeable and appreciative. Aisia Correira, Maryjo RochesterJessica Dawn, CMA

## 2015-11-07 ENCOUNTER — Ambulatory Visit: Payer: Medicaid Other | Admitting: Student

## 2015-12-27 ENCOUNTER — Ambulatory Visit (INDEPENDENT_AMBULATORY_CARE_PROVIDER_SITE_OTHER): Payer: Medicaid Other | Admitting: Family Medicine

## 2015-12-27 ENCOUNTER — Encounter: Payer: Self-pay | Admitting: Family Medicine

## 2015-12-27 VITALS — BP 126/84 | HR 76 | Temp 98.7°F | Ht 73.0 in | Wt 261.0 lb

## 2015-12-27 DIAGNOSIS — R21 Rash and other nonspecific skin eruption: Secondary | ICD-10-CM

## 2015-12-27 NOTE — Progress Notes (Signed)
   Subjective:    Patient ID: Harold Colon , male   DOB: 1955/11/04 , 60 y.o..   MRN: [id number]  HPI  Harold Colon is here for  Chief Complaint  Patient presents with  . Recurrent Skin Infections   RASH  Location: Abdomen, back, arm pits, neck, hands, legs Onset: 1 year ago   Course: patient states he initially got this rash about a year ago, there is no clear inciting event. Over time he has had multiple medications that he has tried including hydrocortisone cream and TRAM sin on cream. Nothing seems to help. At one point he even had oral Lamisil which did not resolve the rash. Self-treated with: Coco butter              Improvement with treatment: no  History Pruritis: yes  Tenderness: no  New medications/antibiotics: no  Tick/insect/pet exposure: no  Recent travel: no  New detergent, new clothing, or other topical exposure: no   Red Flags Feeling ill: no  Fever: no  Mouth lesions: no  Facial/tongue swelling/difficulty breathing:  no  Diabetic or immunocompromised: no   Review of Systems: Per HPI. All other systems reviewed and are negative.  Medications: reviewed   Social Hx:  reports that he has been smoking Cigarettes.  He has been smoking about 0.50 packs per day. He has never used smokeless tobacco.   Objective:   BP 126/84   Pulse 76   Temp 98.7 F (37.1 C) (Oral)   Ht 6\' 1"  (1.854 m)   Wt 261 lb (118.4 kg)   SpO2 97%   BMI 34.43 kg/m  Physical Exam  Gen: NAD, alert, cooperative with exam, well-appearing HEENT: oropharynx clear without lesions Cardiac: Regular rate and rhythm Respiratory: non-labored breathing Skin: Hyperpigmented macular/patchy dry lesions interspersed on abdomen, back, neck, hands, and bilateral lower extremities. Some small papules in between digits on right hand. No discharge from lesions.   Neurological: no gross deficits.  Psych: good insight, normal mood and affect  Assessment & Plan:  Rash Uncontrolled. Unclear  etiology. Patient states this has been going on for the last year. Has tried hydrocortisone, triamcinolone, even oral Lamisil with no relief. Physical exam showing hyperpigmented macular/patchy dry lesions interspersed on abdomen, back, neck, hands, and bilateral lower extremities. Some small papules in between digits on right hand. No discharge from lesions. Patient seen with Dr. Deirdre Priesthambliss who is agreeable to dermatology referral. Patient will withhold all topical treatments until he can see a dermatologist.    Harold Simmondshristina Pheng Prokop, MD [facility], PGY-2

## 2015-12-27 NOTE — Patient Instructions (Addendum)
Thank you for coming in today, it was so nice to see you! Today we talked about:    Rash: I have placed a referral to dermatology for you. Someone should call you to schedule this visit within the next week. Please stop using prescription creams until you see dermatology. If you do not see dermatology by January please come back and see us.  If you have any questions or concerns, please do not hesitate to call the office at [phone]. You can also message me directly via MyChart.   Sincerely,  Anders Simmondshristina Gambino, MD

## 2015-12-27 NOTE — Assessment & Plan Note (Addendum)
Uncontrolled. Unclear etiology. Patient states this has been going on for the last year. Has tried hydrocortisone, triamcinolone, even oral Lamisil with no relief. Physical exam showing hyperpigmented macular/patchy dry lesions interspersed on abdomen, back, neck, hands, and bilateral lower extremities. Some small papules in between digits on right hand. No discharge from lesions. Patient seen with Dr. Deirdre Priesthambliss who is agreeable to dermatology referral. Patient will withhold all topical treatments until he can see a dermatologist.

## 2016-01-11 ENCOUNTER — Other Ambulatory Visit: Payer: Self-pay | Admitting: Family Medicine

## 2016-01-11 DIAGNOSIS — M549 Dorsalgia, unspecified: Principal | ICD-10-CM

## 2016-01-11 DIAGNOSIS — G8929 Other chronic pain: Secondary | ICD-10-CM

## 2016-01-13 ENCOUNTER — Other Ambulatory Visit: Payer: Self-pay | Admitting: Family Medicine

## 2016-01-13 DIAGNOSIS — G8929 Other chronic pain: Secondary | ICD-10-CM

## 2016-01-13 DIAGNOSIS — M549 Dorsalgia, unspecified: Principal | ICD-10-CM

## 2016-05-08 ENCOUNTER — Other Ambulatory Visit: Payer: Self-pay | Admitting: Family Medicine

## 2016-06-07 ENCOUNTER — Ambulatory Visit (INDEPENDENT_AMBULATORY_CARE_PROVIDER_SITE_OTHER): Payer: Medicaid Other | Admitting: Student

## 2016-06-07 ENCOUNTER — Encounter: Payer: Self-pay | Admitting: Student

## 2016-06-07 VITALS — BP 120/78 | HR 80 | Temp 98.9°F | Wt 280.0 lb

## 2016-06-07 DIAGNOSIS — M25561 Pain in right knee: Secondary | ICD-10-CM

## 2016-06-07 DIAGNOSIS — M7989 Other specified soft tissue disorders: Secondary | ICD-10-CM | POA: Diagnosis not present

## 2016-06-07 DIAGNOSIS — M25562 Pain in left knee: Secondary | ICD-10-CM

## 2016-06-07 DIAGNOSIS — M170 Bilateral primary osteoarthritis of knee: Secondary | ICD-10-CM | POA: Diagnosis not present

## 2016-06-07 DIAGNOSIS — B888 Other specified infestations: Secondary | ICD-10-CM | POA: Diagnosis not present

## 2016-06-07 NOTE — Assessment & Plan Note (Signed)
Encouraged to wash all clothes and ned linens in hot water Replace beds and chairs as possible Follow as needed

## 2016-06-07 NOTE — Assessment & Plan Note (Signed)
Bilateral LE swelling, consistent with dependent edema.  - compression stockings - keep legs elevated - patient  encouraged to lay flat at night as much as possible

## 2016-06-07 NOTE — Assessment & Plan Note (Signed)
Referral placed to orthopedic surgery for bilateral knee pain as he has been seen by then in past - follow as needed

## 2016-06-07 NOTE — Progress Notes (Signed)
   Subjective:    Patient ID: Harold Colon, male    DOB: Jun 30, 1955, 61 y.o.   MRN: [id number]   CC: swollen feet  HPI: [age] y/op M presents for swollen feet  Swollen feet - feet swelling noted 1-2 weeks ago - no pain - he does have chronic knee pain for which he has been seen by orthpedic surgery with the plan for possible injection versus replacement but he lost his insurance at the time so could not pursue this. He was also homeless for some time but now has as apartment - he denies any recent falls or or injuries - no weakness - he has been sleeping in his chair recently because he falls asleep there  Bed bugs - he reports bed bugs for which he has tried to wash his clothes and bed linens but has nit been successful in eradicating them - he does hve several roomates   Smoking status reviewed Current daily smoker  Review of Systems  Per HPI, else denies recent illness, fever, chest pain, shortness of breath,     Objective:  BP 120/78   Pulse 80   Temp 98.9 F (37.2 C) (Oral)   Wt 280 lb (127 kg)   SpO2 94%   BMI 36.94 kg/m  Vitals and nursing note reviewed  General: NAD Cardiac: RRR,  Respiratory: CTAB, normal effort Skin: warm and dry, scattered fresh and dried papules over trunk and extremities MSK: minimal knee swelling, 5/5 LE strength Extremities: bilateral trace edema, no calf tenderness, no erythema  Neuro: alert and oriented, no focal deficits   Assessment & Plan:    Swelling of lower extremity Bilateral LE swelling, consistent with dependent edema.  - compression stockings - keep legs elevated - patient  encouraged to lay flat at night as much as possible  Infestation by bed bug Encouraged to wash all clothes and ned linens in hot water Replace beds and chairs as possible Follow as needed  Knee pain Referral placed to orthopedic surgery for bilateral knee pain as he has been seen by then in past - follow as needed    Cristal Qadir A. Kennon RoundsHaney  MD, MS Family Medicine Resident PGY-3 Pager [phone]

## 2016-06-07 NOTE — Patient Instructions (Signed)
Follow up for knee pain with orthopedics Wash all clothes and bed linens in hot water, change bed and chairs as possible Use compression hose and keep feel elevated for swelling Call the office with questions or concerns

## 2016-07-09 ENCOUNTER — Telehealth: Payer: Self-pay | Admitting: Family Medicine

## 2016-07-09 ENCOUNTER — Ambulatory Visit (INDEPENDENT_AMBULATORY_CARE_PROVIDER_SITE_OTHER): Payer: Medicaid Other | Admitting: Family Medicine

## 2016-07-09 ENCOUNTER — Encounter: Payer: Self-pay | Admitting: Family Medicine

## 2016-07-09 VITALS — BP 110/80 | HR 77 | Temp 98.3°F | Ht 73.0 in | Wt 283.0 lb

## 2016-07-09 DIAGNOSIS — R600 Localized edema: Secondary | ICD-10-CM | POA: Diagnosis present

## 2016-07-09 DIAGNOSIS — G8929 Other chronic pain: Secondary | ICD-10-CM

## 2016-07-09 DIAGNOSIS — M7989 Other specified soft tissue disorders: Secondary | ICD-10-CM | POA: Diagnosis not present

## 2016-07-09 DIAGNOSIS — M25561 Pain in right knee: Secondary | ICD-10-CM | POA: Diagnosis not present

## 2016-07-09 DIAGNOSIS — M25562 Pain in left knee: Secondary | ICD-10-CM | POA: Diagnosis not present

## 2016-07-09 DIAGNOSIS — G4733 Obstructive sleep apnea (adult) (pediatric): Secondary | ICD-10-CM

## 2016-07-09 NOTE — Telephone Encounter (Signed)
Harold Colon, this patient states that he has a CPAP machine at home which is managed by [facility]. He reports that one of his neighbors is a Engineer, civil (consulting)nurse, and they called [facility] together to ask about getting a new mask and supplies.  he says that [facility] told him he needs a new prescription for the supplies. I could not figure out how to order this in Epic. Could you help?

## 2016-07-09 NOTE — Assessment & Plan Note (Signed)
Given persistence despite elevation, and history of present illness with symptoms consistent with CHF, will workup for cardiac and noncardiac causes. CBC to check for anemia, CMP to check for hepatic or kidney dysfunction. Echo to assess cardiac function. Will not order BNP as patient is obese and pretest probability for CHF would not allow me to rule this out with a low BNP. Follow-up in one month with PCP.

## 2016-07-09 NOTE — Assessment & Plan Note (Addendum)
Patient reports chronic knee pain, he reports that Ortho offered him injection with "artificial cartilage" in the past, but he was not able to do this or surgery because he was homeless at the time and did not have income. He requested a new referral today, which I placed.

## 2016-07-09 NOTE — Assessment & Plan Note (Signed)
Patient reports that he needs a new prescription for a new mask and hose, unable to see how to order this and Epic. Will message nurse who coordinates DME.

## 2016-07-09 NOTE — Patient Instructions (Signed)
It was a pleasure to see you today! Thank you for choosing [facility] for your primary care. Harold Colon was seen for leg swelling, knee pain.   Our plans for today were:  I placed the referral to orthopedics for your knee injections.   Please call the company that sends your CPAP and have them fax us the form for more CPAP supplies.   We need to get some blood labs and an ultrasound of your heart. We will schedule the ultrasound before you leave today.    You should return to our clinic to see Harold Colon in 1 month for follow up leg swelling.   Best,  Dr. Chanetta Marshallimberlake

## 2016-07-09 NOTE — Progress Notes (Signed)
   CC: bilateral LE edema  HPI Swelling in BLE and hands x1 month. Mostly came on gradually. Once he saw a cardiologist, gave him a pill for possible heart attack (2012 per chart review, pill may be nitro). Harder to breathe when going up stairs recently, this is new. Sleeps in recliner, which is also new. Had some chest pain which occurred at rest while sitting about 3 weeks ago and went away. He feels Ibuprofen may have improved swellling. Some minor improvement with elevation, but the swelling has continued to worry him. Worse with eating salty foods.   Mom and brother with CHF. Patient denies any history of personal diagnosis of CHF or coronary disease.  CC, SH/smoking status, and VS noted  Objective: BP 110/80   Pulse 77   Temp 98.3 F (36.8 C) (Oral)   Ht _0  (1.854 m)   Wt 283 lb (128.4 kg)   SpO2 97%   BMI 37.34 kg/m  Gen: NAD, alert, cooperative, and pleasan obese male. HEENT: NCAT, EOMI, PERRL CV: RRR, no murmur Resp: CTAB, no wheezes, non-labored Abd: SNTND, BS present, no guarding or organomegaly Ext: No edema, warm Neuro: Alert and oriented, Speech clear, No gross deficits  Assessment and plan:  Swelling of lower extremity Given persistence despite elevation, and history of present illness with symptoms consistent with CHF, will workup for cardiac and noncardiac causes. CBC to check for anemia, CMP to check for hepatic or kidney dysfunction. Echo to assess cardiac function. Will not order BNP as patient is obese and pretest probability for CHF would not allow me to rule this out with a low BNP. Follow-up in one month with PCP.   Knee pain Patient reports chronic knee pain, he reports that Ortho offered him injection with "artificial cartilage" in the past, but he was not able to do this or surgery because he was homeless at the time and did not have income. He requested a new referral today, which I placed.  Severe obstructive sleep apnea Patient reports that he  needs a new prescription for a new mask and hose, unable to see how to order this and Epic. Will message nurse who coordinates DME.   Orders Placed This Encounter  Procedures  . CBC  . CMP14+EGFR  . Ambulatory referral to Orthopedic Surgery    Referral Priority:   Routine    Referral Type:   Surgical    Referral Reason:   Specialty Services Required    Requested Specialty:   Orthopedic Surgery    Number of Visits Requested:   1  . ECHOCARDIOGRAM COMPLETE    Standing Status:   Future    Standing Expiration Date:   10/09/2017    Order Specific Question:   Where should this test be performed    Answer:   [place]    Order Specific Question:   Perflutren DEFINITY (image enhancing agent) should be administered unless hypersensitivity or allergy exist    Answer:   Administer Perflutren    Order Specific Question:   Expected Date:    Answer:   1 week    No orders of the defined types were placed in this encounter.   Ralene Ok, MD, PGY1 07/09/2016 4:45 PM

## 2016-07-10 LAB — CBC
HEMOGLOBIN: 14.8 g/dL (ref 13.0–17.7)
Hematocrit: 44.4 % (ref 37.5–51.0)
MCH: 31.2 pg (ref 26.6–33.0)
MCHC: 33.3 g/dL (ref 31.5–35.7)
MCV: 94 fL (ref 79–97)
Platelets: 218 10*3/uL (ref 150–379)
RBC: 4.75 x10E6/uL (ref 4.14–5.80)
RDW: 14.1 % (ref 12.3–15.4)
WBC: 6.8 10*3/uL (ref 3.4–10.8)

## 2016-07-10 LAB — CMP14+EGFR
ALBUMIN: 4.3 g/dL (ref 3.5–5.5)
ALK PHOS: 71 IU/L (ref 39–117)
ALT: 34 IU/L (ref 0–44)
AST: 31 IU/L (ref 0–40)
Albumin/Globulin Ratio: 1.3 (ref 1.2–2.2)
BILIRUBIN TOTAL: 0.3 mg/dL (ref 0.0–1.2)
BUN / CREAT RATIO: 17 (ref 9–20)
BUN: 14 mg/dL (ref 6–24)
CHLORIDE: 102 mmol/L (ref 96–106)
CO2: 25 mmol/L (ref 20–29)
Calcium: 9.7 mg/dL (ref 8.7–10.2)
Creatinine, Ser: 0.83 mg/dL (ref 0.76–1.27)
GFR calc Af Amer: 111 mL/min/{1.73_m2} (ref >59)
GFR calc non Af Amer: 96 mL/min/{1.73_m2} (ref >59)
Globulin, Total: 3.2 g/dL (ref 1.5–4.5)
Glucose: 90 mg/dL (ref 65–99)
Potassium: 4.2 mmol/L (ref 3.5–5.2)
SODIUM: 141 mmol/L (ref 134–144)
Total Protein: 7.5 g/dL (ref 6.0–8.5)

## 2016-07-10 NOTE — Telephone Encounter (Signed)
[facility]. I will let you know as soon as I hear back from her. Kinnie FeilL. Ducatte, RN, BSN

## 2016-07-10 NOTE — Telephone Encounter (Signed)
Received the following staff message from Sheron NightingaleJason Pierce at [facility]: "It appears we have everything we need as of 6/15 and left a VM for him to call in." L. Leward Quanucatte, RN, BSN

## 2016-07-12 ENCOUNTER — Telehealth: Payer: Self-pay | Admitting: Family Medicine

## 2016-07-12 NOTE — Telephone Encounter (Signed)
Called patient to let him know his labs were normal. He voiced understanding.  Loni MuseKate Hendrick Pavich, MD

## 2016-07-18 ENCOUNTER — Other Ambulatory Visit (HOSPITAL_COMMUNITY): Payer: Medicaid Other

## 2016-08-15 ENCOUNTER — Telehealth: Payer: Self-pay | Admitting: *Deleted

## 2016-08-15 NOTE — Telephone Encounter (Signed)
Patient requesting Rx for CPAP supplies (mask, tubing and filter[facility] Will need staff message sent to Henderson NewcomerMelissa Stenson at [facility] once order is placed in Epic making her aware that order has been placed. Kinnie FeilL. Ducatte, RN, BSN

## 2016-10-24 NOTE — Progress Notes (Signed)
   Subjective:   Patient ID: Harold Colon    DOB: 1955/02/14, 61 y.o. male   MRN: [id number]  Harold MoleMichael A Hobart is a 61 y.o. male with a history of OSA, tobacco use disorder, seasonal allergies here for   Cough - Persistent for the last 3-4 weeks, also productive of white phlegm, chest and nasal congestion. - Denies fever, N/V/D. - Currently on Keflex and bactroban for skin infections on back given by dermatologist. Been on for about a week, has 30 day supply - Currently smoking - 0.5ppd - Has seasonal allergies, exacerbated by grass cutting and other environmental factors - Roommate is also sick with similar symptoms - Has gotten flu shot - Not tried any other OTC medications to manage symptoms  Review of Systems:  Per HPI.   PMFSH: reviewed. Smoking status reviewed. Medications reviewed.  Objective:   BP (!) 126/96   Pulse 93   Temp 98.4 F (36.9 C) (Oral)   Ht 6\' 1"  (1.854 m)   Wt 284 lb 9.6 oz (129.1 kg)   SpO2 95%   BMI 37.55 kg/m  Vitals and nursing note reviewed.  General: well nourished, well developed, in no acute distress with non-toxic appearance HEENT: normocephalic, atraumatic, moist mucous membranes. Nasal turbinates erythematous. Throat pink and clear. TMs clear with visible landmarks, no fluid or bulging. PERRL. Neck: supple, non-tender without lymphadenopathy CV: regular rate and rhythm without murmurs, rubs, or gallops Lungs: clear to auscultation bilaterally with normal work of breathing. Slight wheezing to LLL Skin: warm, dry. Several whiteheads with few non-drainable abscess on back. Extremities: warm and well perfused, normal tone Neuro: Alert and oriented, speech normal  Assessment & Plan:   Cough Most likely due to URI - runny nose, cough, congestion. Also has sick contact with similar symptoms. Counseled on symptomatic relief with warm liquids, honey to soothe throat. Counseled on quitting smoking. Previously received flu shot. Return precautions  given.  No orders of the defined types were placed in this encounter.  No orders of the defined types were placed in this encounter.   Ellwood DenseAlison Rumball, DO PGY-1, [facility] 10/25/2016 4:50 PM

## 2016-10-25 ENCOUNTER — Encounter: Payer: Self-pay | Admitting: Family Medicine

## 2016-10-25 ENCOUNTER — Ambulatory Visit (INDEPENDENT_AMBULATORY_CARE_PROVIDER_SITE_OTHER): Payer: Medicaid Other | Admitting: Family Medicine

## 2016-10-25 VITALS — BP 126/96 | HR 93 | Temp 98.4°F | Ht 73.0 in | Wt 284.6 lb

## 2016-10-25 DIAGNOSIS — R059 Cough, unspecified: Secondary | ICD-10-CM | POA: Insufficient documentation

## 2016-10-25 DIAGNOSIS — R05 Cough: Secondary | ICD-10-CM

## 2016-10-25 NOTE — Assessment & Plan Note (Signed)
Most likely due to URI - runny nose, cough, congestion. Also has sick contact with similar symptoms. Counseled on symptomatic relief with warm liquids, honey to soothe throat. Counseled on quitting smoking. Previously received flu shot. Return precautions given.

## 2016-10-25 NOTE — Patient Instructions (Signed)
It was great to see you!  For your cough and congestion: - You have a cold and it should start to get better in about 7 - 10 days.   - For your cough, try warm liquids and 1-2 tbsp of honey at night - For your nasal congestion and runny nose, try using Afrin (generic is Oxymetazoline) twice daily for 3 days.  Do not use for longer that 3 days.   - Some other therapies you can try are: push fluids, rest, gargle warm salt water, encouraged very strongly to quit smoking and return office visit prn if symptoms persist or worsen.  - Drinking warm liquids such as teas and soups can help with secretions and cough. - A mist humidifier or vaporizer can work well to help with secretions and cough.  It is very important to clean the humidifier between use according to the instructions.   - It was good to see you.  If you're still having trouble in the next week, come back and see us.   - Of course, if you start having trouble breathing, worsening fevers, vomiting and unable to hold down any fluids, or you have other concerns, don't hesitate to come back or go to the ED after hours.   The best thing you can do for your cough/congestion and your overall health is to quit smoking. Let us know whenever you are ready to quit and we will help.  Take care and seek immediate care sooner if you develop any concerns.   Ellwood DenseAlison Rumball, DO [facility]

## 2016-11-09 ENCOUNTER — Emergency Department (HOSPITAL_COMMUNITY): Payer: Medicaid Other

## 2016-11-09 ENCOUNTER — Encounter (HOSPITAL_COMMUNITY): Payer: Self-pay | Admitting: Emergency Medicine

## 2016-11-09 ENCOUNTER — Emergency Department (HOSPITAL_COMMUNITY)
Admission: EM | Admit: 2016-11-09 | Discharge: 2016-11-10 | Disposition: A | Discharge status: Home / Self Care | Payer: Medicaid Other | Attending: Emergency Medicine | Admitting: Emergency Medicine

## 2016-11-09 DIAGNOSIS — R197 Diarrhea, unspecified: Secondary | ICD-10-CM

## 2016-11-09 DIAGNOSIS — R112 Nausea with vomiting, unspecified: Secondary | ICD-10-CM

## 2016-11-09 DIAGNOSIS — K859 Acute pancreatitis without necrosis or infection, unspecified: Secondary | ICD-10-CM | POA: Insufficient documentation

## 2016-11-09 DIAGNOSIS — R1013 Epigastric pain: Secondary | ICD-10-CM | POA: Diagnosis not present

## 2016-11-09 DIAGNOSIS — Z79899 Other long term (current) drug therapy: Secondary | ICD-10-CM | POA: Diagnosis not present

## 2016-11-09 DIAGNOSIS — F17210 Nicotine dependence, cigarettes, uncomplicated: Secondary | ICD-10-CM | POA: Insufficient documentation

## 2016-11-09 DIAGNOSIS — R1084 Generalized abdominal pain: Secondary | ICD-10-CM | POA: Diagnosis present

## 2016-11-09 LAB — CBC
HEMATOCRIT: 45.4 % (ref 39.0–52.0)
Hemoglobin: 15.8 g/dL (ref 13.0–17.0)
MCH: 31.7 pg (ref 26.0–34.0)
MCHC: 34.8 g/dL (ref 30.0–36.0)
MCV: 91.2 fL (ref 78.0–100.0)
PLATELETS: 224 10*3/uL (ref 150–400)
RBC: 4.98 MIL/uL (ref 4.22–5.81)
RDW: 13.2 % (ref 11.5–15.5)
WBC: 7.6 10*3/uL (ref 4.0–10.5)

## 2016-11-09 LAB — URINALYSIS, ROUTINE W REFLEX MICROSCOPIC
BILIRUBIN URINE: NEGATIVE
Glucose, UA: NEGATIVE mg/dL
Hgb urine dipstick: NEGATIVE
KETONES UR: NEGATIVE mg/dL
Leukocytes, UA: NEGATIVE
NITRITE: NEGATIVE
PH: 5 (ref 5.0–8.0)
Protein, ur: NEGATIVE mg/dL
Specific Gravity, Urine: 1.017 (ref 1.005–1.030)

## 2016-11-09 LAB — COMPREHENSIVE METABOLIC PANEL
ALBUMIN: 4 g/dL (ref 3.5–5.0)
ALK PHOS: 73 U/L (ref 38–126)
ALT: 27 U/L (ref 17–63)
AST: 27 U/L (ref 15–41)
Anion gap: 8 (ref 5–15)
BUN: 13 mg/dL (ref 6–20)
CALCIUM: 9.1 mg/dL (ref 8.9–10.3)
CO2: 25 mmol/L (ref 22–32)
CREATININE: 0.87 mg/dL (ref 0.61–1.24)
Chloride: 102 mmol/L (ref 101–111)
GFR calc Af Amer: >60 mL/min (ref >60)
GFR calc non Af Amer: >60 mL/min (ref >60)
GLUCOSE: 125 mg/dL — AB (ref 65–99)
Potassium: 4.3 mmol/L (ref 3.5–5.1)
Sodium: 135 mmol/L (ref 135–145)
Total Bilirubin: 0.7 mg/dL (ref 0.3–1.2)
Total Protein: 8 g/dL (ref 6.5–8.1)

## 2016-11-09 LAB — LIPASE, BLOOD: Lipase: 68 U/L — ABNORMAL HIGH (ref 11–51)

## 2016-11-09 MED ORDER — ONDANSETRON HCL 4 MG/2ML IJ SOLN
4.0000 mg | Freq: Once | INTRAMUSCULAR | Status: AC
  Administered 2016-11-09: 4 mg via INTRAVENOUS
  Filled 2016-11-09: qty 2

## 2016-11-09 MED ORDER — IOPAMIDOL (ISOVUE-300) INJECTION 61%
INTRAVENOUS | Status: AC
  Filled 2016-11-09: qty 100

## 2016-11-09 MED ORDER — IOPAMIDOL (ISOVUE-300) INJECTION 61%
100.0000 mL | Freq: Once | INTRAVENOUS | Status: AC | PRN
  Administered 2016-11-09: 100 mL via INTRAVENOUS

## 2016-11-09 MED ORDER — HYDROMORPHONE HCL 1 MG/ML IJ SOLN
1.0000 mg | Freq: Once | INTRAMUSCULAR | Status: AC
  Administered 2016-11-09: 1 mg via INTRAVENOUS
  Filled 2016-11-09: qty 1
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
## 2016-11-09 NOTE — ED Triage Notes (Signed)
Per [facility] patient from home for abd pain. [facility] and saw in his chart that patient had Hepatitis and now afraid that he has it after friend cook him "possibly bad hamburger few days ago".  Vitals: 150/82, HR 80, 16R, 99%, EKG 12 lead NSR, CBG 132.

## 2016-11-09 NOTE — ED Notes (Signed)
ED Provider at bedside. 

## 2016-11-09 NOTE — ED Notes (Signed)
Pt states his pain has diminished. Yesterday, the same thing happened where the pain went away, and the pain came back today. He reports that he has a productive cough with clear mucous.

## 2016-11-10 MED ORDER — SUCRALFATE 1 G PO TABS: 1.0000 g | ORAL_TABLET | Freq: Three times a day (TID) | ORAL | 0 refills | Status: DC

## 2016-11-10 MED ORDER — HYDROCODONE-ACETAMINOPHEN 5-325 MG PO TABS
1.0000 | ORAL_TABLET | Freq: Once | ORAL | Status: AC
  Administered 2016-11-10: 1 via ORAL
  Filled 2016-11-10: qty 1

## 2016-11-10 MED ORDER — ONDANSETRON 4 MG PO TBDP: 4.0000 mg | ORAL_TABLET | Freq: Three times a day (TID) | ORAL | 0 refills | Status: DC | PRN

## 2016-11-10 MED ORDER — GI COCKTAIL ~~LOC~~
30.0000 mL | Freq: Once | ORAL | Status: AC
  Administered 2016-11-10: 30 mL via ORAL
  Filled 2016-11-10: qty 30

## 2016-11-10 MED ORDER — HYDROCODONE-ACETAMINOPHEN 5-325 MG PO TABS: 1.0000 | ORAL_TABLET | ORAL | 0 refills | Status: DC | PRN

## 2016-11-10 MED ORDER — HYDROCODONE-ACETAMINOPHEN 5-325 MG PO TABS: 2.0000 | ORAL_TABLET | Freq: Once | ORAL | Status: DC

## 2016-11-10 MED ORDER — RANITIDINE HCL 150 MG PO CAPS: 150.0000 mg | ORAL_CAPSULE | Freq: Every day | ORAL | 0 refills | Status: DC

## 2016-11-10 NOTE — Discharge Instructions (Signed)
Try to drink clear liquids/soups, with NO fatty foods for the next 2-3 days, then advance diet as tolerated  DO NOT DRINK ANY ALCOHOL  Follow-up with your primary doctor

## 2017-03-20 ENCOUNTER — Telehealth: Payer: Self-pay | Admitting: *Deleted

## 2017-03-20 DIAGNOSIS — M549 Dorsalgia, unspecified: Principal | ICD-10-CM

## 2017-03-20 DIAGNOSIS — G8929 Other chronic pain: Secondary | ICD-10-CM

## 2017-03-20 NOTE — Telephone Encounter (Signed)
Received fax for refill request on Tramadol 50 mg tablets from [facility]([address]  Appears in pt med list that he reported he was not taking on 11/09/16.  Note states that a new prescription is required to continue therapy. Routing to PCP. Lamonte SakaiZimmerman Rumple, April D, [place]

## 2017-03-22 MED ORDER — TRAMADOL HCL 50 MG PO TABS: ORAL_TABLET | ORAL | 0 refills | Status: DC

## 2017-03-22 NOTE — Telephone Encounter (Signed)
Done and placed in "to be faxed" box.  

## 2017-03-25 ENCOUNTER — Other Ambulatory Visit: Payer: Self-pay

## 2017-03-25 DIAGNOSIS — G8929 Other chronic pain: Secondary | ICD-10-CM

## 2017-03-25 DIAGNOSIS — M549 Dorsalgia, unspecified: Principal | ICD-10-CM

## 2017-09-16 ENCOUNTER — Ambulatory Visit
Admission: RE | Admit: 2017-09-16 | Discharge: 2017-09-16 | Disposition: A | Discharge status: Home / Self Care | Payer: Medicaid Other | Source: Ambulatory Visit | Attending: Family Medicine | Admitting: Family Medicine

## 2017-09-16 ENCOUNTER — Other Ambulatory Visit: Payer: Self-pay

## 2017-09-16 ENCOUNTER — Ambulatory Visit (INDEPENDENT_AMBULATORY_CARE_PROVIDER_SITE_OTHER): Payer: Medicaid Other | Admitting: Family Medicine

## 2017-09-16 ENCOUNTER — Encounter: Payer: Self-pay | Admitting: Family Medicine

## 2017-09-16 VITALS — BP 110/72 | HR 77 | Temp 98.1°F | Ht 73.0 in | Wt 277.0 lb

## 2017-09-16 DIAGNOSIS — M25531 Pain in right wrist: Secondary | ICD-10-CM

## 2017-09-16 MED ORDER — WRAPAROUND WRIST SUPPORT MISC
1.0000 | 0 refills | Status: AC | PRN
Start: 2017-09-16 — End: ?

## 2017-09-16 MED ORDER — DICLOFENAC SODIUM 75 MG PO TBEC: 75.0000 mg | DELAYED_RELEASE_TABLET | Freq: Two times a day (BID) | ORAL | 0 refills | Status: DC | PRN

## 2017-09-16 MED ORDER — OMEPRAZOLE 40 MG PO CPDR: 40.0000 mg | DELAYED_RELEASE_CAPSULE | Freq: Every day | ORAL | 3 refills | Status: DC

## 2017-09-16 NOTE — Assessment & Plan Note (Signed)
Sudden onset wrist pain and swelling.  Atraumatic history.  Neurovascularly intact.  Not improved with wrapping or as needed ibuprofen.  Given atraumatic history, concern for gout pseudogout or other inflammatory arthropathy.  Although seems unlikely given late onset and no other history of this. -Wrist x-rays to assess for fracture or chronic arthropathy -Diclofenac as needed -Ice, heat as needed -Return to 2 weeks or sooner if not improving

## 2017-09-16 NOTE — Progress Notes (Signed)
Please call patient and inform that he did not have a fracture. He may come back to clinic to follow up if no improvement with diclofenac.

## 2017-09-16 NOTE — Progress Notes (Signed)
Subjective:    Harold Colon is an 62 y.o. male who presents for evaluation of right wrist pain. Onset was sudden, starting about 1 week ago. The pain is moderate, worsens with movement, and is relieved by rest. There is no associated numbness, tingling, weakness. There is no history of injury, overuse. Evaluation to date: none. Treatment to date: OTC analgesics, ice, avoidance of offending activity, wrist splint which is not very effective.  Patient has a history of gout or other types of swelling like this before.  Does not have any history of blood clots.  Not associate with any chest pain or shortness of breath.  The following portions of the patient's history were reviewed and updated as appropriate: allergies, current medications, past family history, past medical history, past social history, past surgical history and problem list.  Review of Systems Pertinent items noted in HPI and remainder of comprehensive ROS otherwise negative.   Objective:    BP 110/72   Pulse 77   Temp 98.1 F (36.7 C) (Oral)   Ht 6\' 1"  (1.854 m)   Wt 277 lb (125.6 kg)   SpO2 98%   BMI 36.55 kg/m  Right wrist:  soft tissue tenderness and swelling at the Wrist and lower digits, reduced range of motion of Flexion extension of wrist and scaphoid (snuffbox) tenderness absent, normal radial pulses     Assessment and Pain:  Right wrist pain Sudden onset wrist pain and swelling.  Atraumatic history.  Neurovascularly intact.  Not improved with wrapping or as needed ibuprofen.  Given atraumatic history, concern for gout pseudogout or other inflammatory arthropathy.  Although seems unlikely given late onset and no other history of this. -Wrist x-rays to assess for fracture or chronic arthropathy -Diclofenac as needed -Ice, heat as needed -Return to 2 weeks or sooner if not improving

## 2017-09-16 NOTE — Patient Instructions (Signed)
It was a pleasure to see you today! Thank you for choosing [facility] for your primary care. Harold Colon was seen for wrist pain.   -  For wrist pain, continue to wrap as needed for comfort and reduce swelling.   - You may use ice or heat as needed for pain relief.  - You can take diclofenac as needed for pain as prescribed.   - Go to [facility] imaging at [employer] [employer]. to get your wrist x-ray.     Best,  Thomes Dinning, MD, MS FAMILY MEDICINE RESIDENT - PGY2 09/16/2017 11:09 AM

## 2017-09-17 MED ORDER — OMEPRAZOLE 40 MG PO CPDR: 40.0000 mg | DELAYED_RELEASE_CAPSULE | Freq: Every day | ORAL | 3 refills | Status: DC

## 2017-09-17 MED ORDER — DICLOFENAC SODIUM 75 MG PO TBEC: 75.0000 mg | DELAYED_RELEASE_TABLET | Freq: Two times a day (BID) | ORAL | 0 refills | Status: DC | PRN

## 2017-09-17 NOTE — Addendum Note (Signed)
Addended by: Henri Medal on: 09/17/2017 04:40 PM   Modules accepted: Orders

## 2017-09-30 ENCOUNTER — Ambulatory Visit (HOSPITAL_COMMUNITY)
Admission: EM | Admit: 2017-09-30 | Discharge: 2017-09-30 | Disposition: A | Discharge status: Home / Self Care | Payer: Medicaid Other | Attending: Family Medicine | Admitting: Family Medicine

## 2017-09-30 ENCOUNTER — Encounter (HOSPITAL_COMMUNITY): Payer: Self-pay

## 2017-09-30 DIAGNOSIS — R21 Rash and other nonspecific skin eruption: Secondary | ICD-10-CM | POA: Diagnosis not present

## 2017-09-30 MED ORDER — CHLORHEXIDINE GLUCONATE 4 % EX LIQD: Freq: Every day | CUTANEOUS | 0 refills | Status: DC | PRN

## 2017-09-30 MED ORDER — DOXYCYCLINE HYCLATE 100 MG PO CAPS: 100.0000 mg | ORAL_CAPSULE | Freq: Two times a day (BID) | ORAL | 0 refills | Status: DC
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
## 2017-09-30 NOTE — Discharge Instructions (Addendum)
It was nice meeting you!!  We will treat your infection with doxycycline. I am also sending some Hibiclens to the pharmacy Follow-up with your dermatologist as needed

## 2017-09-30 NOTE — ED Triage Notes (Signed)
Pt reports having a rash to both of his sides that he had a couple weeks ago and took a medication for that is now coming back. Reports itching.

## 2017-10-02 ENCOUNTER — Other Ambulatory Visit: Payer: Self-pay | Admitting: Family Medicine

## 2017-10-02 DIAGNOSIS — M25531 Pain in right wrist: Secondary | ICD-10-CM

## 2017-10-16 ENCOUNTER — Ambulatory Visit: Payer: Medicaid Other | Admitting: Family Medicine

## 2017-11-05 ENCOUNTER — Emergency Department (HOSPITAL_COMMUNITY)
Admission: EM | Admit: 2017-11-05 | Discharge: 2017-11-05 | Disposition: A | Discharge status: Home / Self Care | Payer: Medicaid Other | Attending: Emergency Medicine | Admitting: Emergency Medicine

## 2017-11-05 ENCOUNTER — Emergency Department (HOSPITAL_COMMUNITY): Payer: Medicaid Other

## 2017-11-05 ENCOUNTER — Other Ambulatory Visit: Payer: Self-pay

## 2017-11-05 ENCOUNTER — Encounter (HOSPITAL_COMMUNITY): Payer: Self-pay | Admitting: Emergency Medicine

## 2017-11-05 DIAGNOSIS — F17210 Nicotine dependence, cigarettes, uncomplicated: Secondary | ICD-10-CM | POA: Diagnosis not present

## 2017-11-05 DIAGNOSIS — R072 Precordial pain: Secondary | ICD-10-CM | POA: Insufficient documentation

## 2017-11-05 DIAGNOSIS — J302 Other seasonal allergic rhinitis: Secondary | ICD-10-CM | POA: Diagnosis not present

## 2017-11-05 DIAGNOSIS — Z79899 Other long term (current) drug therapy: Secondary | ICD-10-CM | POA: Diagnosis not present

## 2017-11-05 DIAGNOSIS — R079 Chest pain, unspecified: Secondary | ICD-10-CM | POA: Diagnosis present

## 2017-11-05 LAB — CBC
HEMATOCRIT: 46.9 % (ref 39.0–52.0)
Hemoglobin: 15.3 g/dL (ref 13.0–17.0)
MCH: 30.5 pg (ref 26.0–34.0)
MCHC: 32.6 g/dL (ref 30.0–36.0)
MCV: 93.6 fL (ref 80.0–100.0)
NRBC: 0 % (ref 0.0–0.2)
Platelets: 218 10*3/uL (ref 150–400)
RBC: 5.01 MIL/uL (ref 4.22–5.81)
RDW: 12.9 % (ref 11.5–15.5)
WBC: 5.8 10*3/uL (ref 4.0–10.5)

## 2017-11-05 LAB — BASIC METABOLIC PANEL
ANION GAP: 6 (ref 5–15)
BUN: 10 mg/dL (ref 6–20)
CALCIUM: 9.5 mg/dL (ref 8.9–10.3)
CO2: 26 mmol/L (ref 22–32)
Chloride: 106 mmol/L (ref 98–111)
Creatinine, Ser: 0.87 mg/dL (ref 0.61–1.24)
GFR calc non Af Amer: >60 mL/min (ref >60)
GLUCOSE: 113 mg/dL — AB (ref 70–99)
Potassium: 3.7 mmol/L (ref 3.5–5.1)
Sodium: 138 mmol/L (ref 135–145)

## 2017-11-05 LAB — I-STAT TROPONIN, ED: Troponin i, poc: 0 ng/mL (ref 0.00–0.08)

## 2017-11-05 NOTE — ED Notes (Signed)
Dr. Wickline at bedside.  

## 2017-11-05 NOTE — ED Triage Notes (Signed)
Pt BIB [facility], pt woken up by chest pain approx. 1 hr PTA. Pt describes chest pain as a heavy pressure and non-radiating, denies associated symptoms. Given 324mg  aspirin PTA. EMS vitals: BP 140/104, HR 62, SpO2 98% room air, RR 16
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
## 2017-11-05 NOTE — Discharge Instructions (Addendum)

## 2017-12-12 ENCOUNTER — Ambulatory Visit (HOSPITAL_COMMUNITY)
Admission: EM | Admit: 2017-12-12 | Discharge: 2017-12-12 | Disposition: A | Discharge status: Home / Self Care | Payer: Medicaid Other | Attending: Nurse Practitioner | Admitting: Nurse Practitioner

## 2017-12-12 ENCOUNTER — Other Ambulatory Visit: Payer: Self-pay

## 2017-12-12 ENCOUNTER — Encounter (HOSPITAL_COMMUNITY): Payer: Self-pay | Admitting: Emergency Medicine

## 2017-12-12 DIAGNOSIS — J209 Acute bronchitis, unspecified: Secondary | ICD-10-CM | POA: Diagnosis not present

## 2017-12-12 MED ORDER — ALBUTEROL SULFATE HFA 108 (90 BASE) MCG/ACT IN AERS: 1.0000 | INHALATION_SPRAY | Freq: Four times a day (QID) | RESPIRATORY_TRACT | 0 refills | Status: DC | PRN

## 2017-12-12 MED ORDER — PREDNISONE 20 MG PO TABS: 40.0000 mg | ORAL_TABLET | Freq: Every day | ORAL | 0 refills | Status: AC

## 2017-12-12 MED ORDER — AZITHROMYCIN 250 MG PO TABS: 250.0000 mg | ORAL_TABLET | Freq: Every day | ORAL | 0 refills | Status: DC

## 2017-12-12 NOTE — Discharge Instructions (Signed)
Take medications as prescribed. May take ibuprofen as needed for muscle ache. Stop smoking!!! Follow-up with your primary care in 1 week. Go to ED if worse.

## 2017-12-12 NOTE — ED Triage Notes (Signed)
PT reports cough for 2 days and has a pain over right ribs. PT has had pneumonia before and is concerned about that.
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
## 2017-12-12 NOTE — ED Triage Notes (Signed)
PT has been out of his meds for the last few weeks

## 2017-12-24 ENCOUNTER — Other Ambulatory Visit: Payer: Self-pay

## 2017-12-24 ENCOUNTER — Encounter: Payer: Self-pay | Admitting: Family Medicine

## 2017-12-24 ENCOUNTER — Ambulatory Visit: Payer: Medicaid Other | Admitting: Family Medicine

## 2017-12-24 VITALS — BP 124/78 | HR 78 | Temp 98.1°F | Ht 73.0 in | Wt 271.2 lb

## 2017-12-24 DIAGNOSIS — M25512 Pain in left shoulder: Secondary | ICD-10-CM

## 2017-12-24 DIAGNOSIS — J209 Acute bronchitis, unspecified: Secondary | ICD-10-CM | POA: Diagnosis present

## 2017-12-24 DIAGNOSIS — Z23 Encounter for immunization: Secondary | ICD-10-CM
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
## 2017-12-24 NOTE — Patient Instructions (Signed)
It was good to see you again today.  Take the Tramadol every 6-8 hours as needed for pain relief for your shoulder.  Keep using your inhaler if you need it.    Let me know if you would like to do the shot for your shoulder depending on how your feel.  Have a Merry Christmas and Happy New Year's!  Good luck quitting smoking, let me know if you need any help with that

## 2017-12-25 ENCOUNTER — Encounter: Payer: Self-pay | Admitting: Family Medicine

## 2017-12-25 DIAGNOSIS — M25512 Pain in left shoulder: Secondary | ICD-10-CM

## 2017-12-25 DIAGNOSIS — J209 Acute bronchitis, unspecified: Secondary | ICD-10-CM | POA: Insufficient documentation

## 2017-12-25 HISTORY — DX: Pain in left shoulder: M25.512

## 2017-12-25 NOTE — Assessment & Plan Note (Signed)
Improving.  No need for further FU from this standpoint.

## 2017-12-25 NOTE — Assessment & Plan Note (Signed)
Arthralgia that sounds most like tendonopathy.  Continue NSAID use.  Tramadol for severe pain.   - Discussed corticosteroid injection.  Will try movement plus NSAIDs/analgesics for 2 weeks.  If still in pain will return for consideration of injection.

## 2018-01-17 ENCOUNTER — Telehealth: Payer: Self-pay | Admitting: Family Medicine

## 2018-01-17 MED ORDER — ALBUTEROL SULFATE HFA 108 (90 BASE) MCG/ACT IN AERS: 1.0000 | INHALATION_SPRAY | Freq: Four times a day (QID) | RESPIRATORY_TRACT | 0 refills | Status: DC | PRN

## 2018-01-17 MED ORDER — TRAMADOL HCL 50 MG PO TABS: 50.0000 mg | ORAL_TABLET | Freq: Three times a day (TID) | ORAL | 0 refills | Status: DC | PRN

## 2018-01-17 NOTE — Telephone Encounter (Signed)
I have sent in these medications.  The discussion was about doing a shoulder injection.  If he continues to have pain, he can make an appointment to have this done.

## 2018-01-17 NOTE — Telephone Encounter (Signed)
Pt came in office requesting a Rx for his arthritis and shoulder due to pain, he is also asking for a inhaler. Pt stated that he already talk about this with Dr.Walden. Best phone # to contact is [phone].

## 2018-01-21 ENCOUNTER — Ambulatory Visit: Payer: Medicaid Other | Admitting: Family Medicine

## 2018-01-27 ENCOUNTER — Ambulatory Visit (HOSPITAL_COMMUNITY)
Admission: EM | Admit: 2018-01-27 | Discharge: 2018-01-27 | Disposition: A | Discharge status: Home / Self Care | Payer: Medicaid Other | Attending: Family Medicine | Admitting: Family Medicine

## 2018-01-27 DIAGNOSIS — R21 Rash and other nonspecific skin eruption: Secondary | ICD-10-CM | POA: Insufficient documentation

## 2018-01-27 MED ORDER — DOXYCYCLINE HYCLATE 100 MG PO CAPS: 100.0000 mg | ORAL_CAPSULE | Freq: Two times a day (BID) | ORAL | 0 refills | Status: DC

## 2018-01-27 MED ORDER — CHLORHEXIDINE GLUCONATE 4 % EX LIQD: Freq: Every day | CUTANEOUS | 0 refills | Status: DC | PRN

## 2018-01-27 NOTE — Discharge Instructions (Addendum)
We will go ahead and treat your infection with doxycycline twice a day for 10 days Hibiclens wash to use daily as needed Follow-up with your doctor scheduled

## 2018-01-28 ENCOUNTER — Telehealth: Payer: Self-pay

## 2018-01-28 NOTE — Telephone Encounter (Signed)
Received fax from [facility] pharmacy requesting prior authorization of Tramadol.  Completed PA info in [facility] Tracks for Tramadol.  Status pending.  Will recheck status in 24 hours. Nigel Mormon, RN
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
## 2018-01-28 NOTE — Telephone Encounter (Signed)
Prior approval for Tramadol completed via [place] [employer].  Med approved for 01/28/18 - 07/27/18.  Prior approval  # [id number].  [facility] pharmacy informed.  Nigel MormonAlisa S Brake, RN

## 2018-06-06 ENCOUNTER — Other Ambulatory Visit: Payer: Self-pay

## 2018-06-06 ENCOUNTER — Encounter: Payer: Self-pay | Admitting: Family Medicine

## 2018-06-06 ENCOUNTER — Ambulatory Visit: Payer: Medicaid Other | Admitting: Family Medicine

## 2018-06-06 VITALS — BP 132/80 | HR 69

## 2018-06-06 DIAGNOSIS — R1013 Epigastric pain: Secondary | ICD-10-CM | POA: Insufficient documentation

## 2018-06-06 HISTORY — DX: Epigastric pain: R10.13

## 2018-06-06 MED ORDER — SUCRALFATE 1 G PO TABS: 1.0000 g | ORAL_TABLET | Freq: Three times a day (TID) | ORAL | 0 refills | Status: DC

## 2018-06-06 MED ORDER — OMEPRAZOLE 40 MG PO CPDR: 40.0000 mg | DELAYED_RELEASE_CAPSULE | Freq: Every day | ORAL | 0 refills | Status: DC

## 2018-06-06 NOTE — Patient Instructions (Signed)
Thank you for coming in to see [place] today. Please see below to review our plan for today's visit.  Take the medication I prescribed you.  If your symptoms worsen, call 911.  Symptoms to look out for include worsening abdominal pain, bleeding in your stool, or vomiting.  Your lab work should result within 48 hours.  I will call you if there is anything abnormal, otherwise you should receive results in the mail.  The most important thing is for you to stop drinking alcohol, this includes wine.  It is also important that you stop smoking.  Please call the clinic at [phone] if your symptoms worsen or you have any concerns. It was our pleasure to serve you.  Durward Parcel, DO [facility], PGY-3

## 2018-06-06 NOTE — Assessment & Plan Note (Signed)
Likely PUD, gastric first duodenal?  History of duodenitis on EGD in 2013 with similar presentation per patient.  Top concern aortic dissection less likely with intact and symmetrical dorsal pedal pulse with stable BP of 132/80.  Not tachycardic and without increased work of breathing.  No history of pancreatitis though given alcohol use, could be likely source of pain.  No history of vomiting or signs of volume loss.  No history of melena or hematochezia.  Does not seem to affect RUQ making bladder disease less likely.  RLQ and LLQ are without tenderness making appendicitis or diverticulitis less likely.  Patient is a smoker however pain is not out of proportion making ischemic bowel less likely. - Checking CBC, CMET, lipase, GGT - Given trial of Carafate with Protonix 40 mg daily - Reviewed return precautions and specific reasons to call 911, will follow-up on Monday

## 2018-06-06 NOTE — Progress Notes (Signed)
Subjective   Patient ID: ARI ROSENDALE    DOB: 1955-11-10, 63 y.o. male   MRN: [id number]  CC: "Abdominal pain"  HPI: Harold Colon is a 63 y.o. male who presents to clinic today for the following:  ABDOMINAL PAIN  Onset: 4 days, gradual onset after drinking x2 wine coolers Medications tried: took Maalox without improvement Similar pain before: similar to when he had a peptic ulcer Prior abdominal surgeries: no  Symptoms Nausea/vomiting: no Diarrhea: no, but loose stool Constipation: no Blood in stool: no Blood in vomit: no Fever: no Dysuria: no Loss of appetite: yes Weight loss: no Vaginal Bleeding: n/a Missed menstrual period: n/a  Of note, patient has a history of cocaine use but denies use for several years.  Last colonoscopy in 2013 with external and internal hemorrhoids with recommendations for follow-up in 10 years.  EGD also in 2013 for similar epigastric pain thought to be related to duodenitis without signs of active bleeding during scope procedure.  Current everyday smoker not ready to quit.  Has history of alcohol use disorder, denies that he has a drinking problem but does report drinking 2-3 drinks a few times throughout the week.  Sometimes considers a drink to be a box of wine.  Has naproxen and diclofenac tablets listed on medication list, however patient has seldom use of NSAIDs after being instructed not to take it back in January for his chronic shoulder pain which he takes tramadol for.  Last bowel movement was this morning.  ROS: see HPI for pertinent.  PMFSH: Reviewed. Smoking status reviewed. Medications reviewed.  Objective   BP 132/80   Pulse 69   SpO2 97%  Vitals and nursing note reviewed.  General: well nourished, well developed, NAD with non-toxic appearance HEENT: normocephalic, atraumatic, moist mucous membranes Neck: supple, non-tender without lymphadenopathy, absent JVD Cardiovascular: regular rate and rhythm with systolic murmur  without rubs, or gallops Lungs: clear to auscultation bilaterally with normal work of breathing Abdomen: obese, soft, tenderness localized to epigastric region without rebound or guarding, negative Murphy sign, negative McBurney sign, normoactive bowel sounds Skin: warm, dry, no rashes or lesions Extremities: warm and well perfused, normal tone, no edema, 2+ dorsal pedal pulse bilaterally  Assessment & Plan   Acute epigastric pain Likely PUD, gastric first duodenal?  History of duodenitis on EGD in 2013 with similar presentation per patient.  Top concern aortic dissection less likely with intact and symmetrical dorsal pedal pulse with stable BP of 132/80.  Not tachycardic and without increased work of breathing.  No history of pancreatitis though given alcohol use, could be likely source of pain.  No history of vomiting or signs of volume loss.  No history of melena or hematochezia.  Does not seem to affect RUQ making bladder disease less likely.  RLQ and LLQ are without tenderness making appendicitis or diverticulitis less likely.  Patient is a smoker however pain is not out of proportion making ischemic bowel less likely. - Checking CBC, CMET, lipase, GGT - Given trial of Carafate with Protonix 40 mg daily - Reviewed return precautions and specific reasons to call 911, will follow-up on Monday  Orders Placed This Encounter  Procedures  . CBC  . Comprehensive metabolic panel    Order Specific Question:   Has the patient fasted?    Answer:   No  . Lipase  . Gamma GT   Meds ordered this encounter  Medications  . omeprazole (PRILOSEC) 40 MG capsule  Sig: Take 1 capsule (40 mg total) by mouth daily.    Dispense:  30 capsule    Refill:  0  . sucralfate (CARAFATE) 1 g tablet    Sig: Take 1 tablet (1 g total) by mouth 4 (four) times daily -  with meals and at bedtime for 7 days.    Dispense:  28 tablet    Refill:  0    Durward Parcelavid , DO [facility], PGY-3 06/06/2018,  4:33 PM

## 2018-06-07 LAB — COMPREHENSIVE METABOLIC PANEL
ALT: 15 IU/L (ref 0–44)
AST: 14 IU/L (ref 0–40)
Albumin/Globulin Ratio: 1.4 (ref 1.2–2.2)
Albumin: 4.2 g/dL (ref 3.8–4.8)
Alkaline Phosphatase: 82 IU/L (ref 39–117)
BUN/Creatinine Ratio: 11 (ref 10–24)
BUN: 10 mg/dL (ref 8–27)
Bilirubin Total: 0.3 mg/dL (ref 0.0–1.2)
CO2: 24 mmol/L (ref 20–29)
Calcium: 9.4 mg/dL (ref 8.6–10.2)
Chloride: 101 mmol/L (ref 96–106)
Creatinine, Ser: 0.89 mg/dL (ref 0.76–1.27)
GFR calc Af Amer: 107 mL/min/{1.73_m2} (ref >59)
GFR calc non Af Amer: 92 mL/min/{1.73_m2} (ref >59)
Globulin, Total: 2.9 g/dL (ref 1.5–4.5)
Glucose: 103 mg/dL — ABNORMAL HIGH (ref 65–99)
Potassium: 3.7 mmol/L (ref 3.5–5.2)
Sodium: 139 mmol/L (ref 134–144)
Total Protein: 7.1 g/dL (ref 6.0–8.5)

## 2018-06-07 LAB — CBC
Hematocrit: 43.7 % (ref 37.5–51.0)
Hemoglobin: 14.4 g/dL (ref 13.0–17.7)
MCH: 30.5 pg (ref 26.6–33.0)
MCHC: 33 g/dL (ref 31.5–35.7)
MCV: 93 fL (ref 79–97)
Platelets: 233 10*3/uL (ref 150–450)
RBC: 4.72 x10E6/uL (ref 4.14–5.80)
RDW: 13 % (ref 11.6–15.4)
WBC: 6.7 10*3/uL (ref 3.4–10.8)

## 2018-06-07 LAB — LIPASE: Lipase: 236 U/L — ABNORMAL HIGH (ref 13–78)

## 2018-06-07 LAB — GAMMA GT: GGT: 28 IU/L (ref 0–65)

## 2018-06-09 ENCOUNTER — Telehealth: Payer: Self-pay | Admitting: Family Medicine

## 2018-06-09 ENCOUNTER — Encounter: Payer: Self-pay | Admitting: Family Medicine

## 2018-06-09 DIAGNOSIS — K859 Acute pancreatitis without necrosis or infection, unspecified: Secondary | ICD-10-CM | POA: Insufficient documentation

## 2018-06-09 HISTORY — DX: Acute pancreatitis without necrosis or infection, unspecified: K85.90

## 2018-06-09 NOTE — Telephone Encounter (Signed)
Contacted patient to follow-up labs from office visit for epigastric pain on 06/06/2018.  Labs support pancreatitis.  No signs of elevated transaminase, bilirubin, or kidney damage.  Electrolytes and CBC WNL.  Patient states he "is feeling pretty good today."  He was able to tolerate crackers and soup over the weekend but has not had anything to eat today.  He denies nausea or vomiting.  We did discuss red flags and reasons to go to the emergency room/call 911.  Patient would like to avoid doing so and is amenable to increasing his fluid intake and eating a bland diet.  We also discussed his alcohol use is the likely source of his pancreatitis.  Patient has remained sober since last visit.  Instructed patient to contact clinic if symptoms worsen or begins to have vomiting over the next few days which point he will likely need to go to the ED and receive IVF.  Patient in agreement with plan.  Durward Parcel, DO [facility], PGY-3

## 2018-07-23 ENCOUNTER — Ambulatory Visit: Payer: Medicaid Other | Admitting: Family Medicine

## 2018-07-23 ENCOUNTER — Other Ambulatory Visit: Payer: Self-pay

## 2018-07-23 ENCOUNTER — Other Ambulatory Visit (HOSPITAL_COMMUNITY)
Admission: RE | Admit: 2018-07-23 | Discharge: 2018-07-23 | Disposition: A | Discharge status: Home / Self Care | Payer: Medicaid Other | Source: Ambulatory Visit | Attending: Family Medicine | Admitting: Family Medicine

## 2018-07-23 ENCOUNTER — Encounter: Payer: Self-pay | Admitting: Family Medicine

## 2018-07-23 VITALS — BP 110/74 | HR 78 | Wt 262.2 lb

## 2018-07-23 DIAGNOSIS — Z113 Encounter for screening for infections with a predominantly sexual mode of transmission: Secondary | ICD-10-CM | POA: Diagnosis not present

## 2018-07-23 DIAGNOSIS — M25511 Pain in right shoulder: Secondary | ICD-10-CM | POA: Diagnosis not present

## 2018-07-23 DIAGNOSIS — E785 Hyperlipidemia, unspecified: Secondary | ICD-10-CM

## 2018-07-23 DIAGNOSIS — K852 Alcohol induced acute pancreatitis without necrosis or infection: Secondary | ICD-10-CM | POA: Diagnosis not present

## 2018-07-23 DIAGNOSIS — M25512 Pain in left shoulder: Secondary | ICD-10-CM | POA: Diagnosis not present

## 2018-07-23 DIAGNOSIS — Z8659 Personal history of other mental and behavioral disorders: Secondary | ICD-10-CM

## 2018-07-23 DIAGNOSIS — Z87898 Personal history of other specified conditions: Secondary | ICD-10-CM

## 2018-07-23 DIAGNOSIS — F17200 Nicotine dependence, unspecified, uncomplicated: Secondary | ICD-10-CM

## 2018-07-23 NOTE — Patient Instructions (Signed)
It was a pleasure to meet you today,  I am glad to hear that you have stopped drinking, and only drinking wine every once a while with dinner.  This will definitely help you lower your risk of getting repeated episodes of pancreatitis.  Also try to limit your Advil/ibuprofen usage to once or twice a week at most.  I will let you know the results of your cholesterol test.  If it is high enough that we should put you on a medication, I will let you know.  I will let you know the results of your other lab work to when I get them.  I would not be concerned about the episodes of ache you experience for a few minutes every couple months.  If they start to interfere with your life or become more frequent, you can always schedule appointment so we can work it up further.  I would like to have you back in 3 weeks to discuss your symptoms of depression more in-depth, before I prescribe you Wellbutrin again.  Have a great day,  Clemetine Marker, MD

## 2018-07-23 NOTE — Progress Notes (Signed)
[facility] Phone: [phone]   cc: STI check, depression/anxiety, pancreatitis follow-up  Subjective:  Pancreatitis: Patient states his abdominal pain is much improved.  He has stopped drinking liquor and only occasionally has "dinner wine" once in a blue moon.  He only takes Advil when he is in severe pain which is once or twice a month.  STI: Patient had unprotected sex with a new male partner about 1 week ago.  It was 1 time.  He states he felt a little burning/pain that went away in his genitalia, but did not occur when he urinated.  He is no longer experience any genital discomfort.  Depression: Patient states he was taking bupropion "as needed" and has not taken it in 6 months he says.  He would like to start taking it every day now, as he is feeling a little more down.  He denies any changes to his sleeping habits.  He states he is trying to lose weight to be healthier.    Smoking: He has a nicotine patch and only smokes cigarettes occasionally.  He states he had a fever yesterday but other than that has not had 1 in a few weeks.  Shoulders: Starting 6 months ago the patient states he has occasional episodes where his shoulders and chest start to ache and make him want to "slumped down" and cause him to feel "kind of weak".  He states these happen every month or 2 months and last approximately 2 to 3 minutes.  The first time he experienced this he came into the hospital if he was having a heart attack, but he was not.  The last time he experience 20s episodes was 2 weeks ago.  There are no prodromal symptoms, and symptoms are resolved immediately when the episode ends.  There is no specific time, situation, or position that caused the symptoms to occur  ROS: See HPI for pertinent positives and negatives  Past Medical History  Family history reviewed for today's visit. No changes.  Social history- patient is a occasional smoker who is trying to quit   Objective: BP 110/74   Pulse 78   Wt 262 lb 3.2 oz (118.9 kg)   SpO2 97%   BMI 34.59 kg/m  Gen: NAD, alert and oriented, cooperative with exam CV: normal rate, regular rhythm. No murmurs, no rubs.  Resp: LCTAB, no wheezes, crackles. normal work of breathing GI: nontender to palpation, BS present, no guarding or organomegaly Msk: No edema, warm, normal tone, moves UE/LE spontaneously Skin: No rashes, no lesions Psych: Appropriate behavior  Assessment/Plan: Pancreatitis Patient says his epigastric symptoms have resolved.  They have not recurred since his last visit in diagnosis.  He has stopped drinking liquor and only occasionally drinks wine with dinner.  He does not take NSAIDs regularly.  Likely this was due to his alcohol use.  Patient seems very motivated to continue to not drink alcohol excessively. - Lipid panel to look for elevated cholesterol and hypertriglyceridemia. -Advised patient to continue to limit alcohol use and to avoid prolonged NSAID use.  Tobacco use disorder Patient actively trying to quit smoking.  Using nicotine patch currently, will only occasionally have days where he has a cigarette or 2.  Encourage patient to continue smoking cessation efforts.  If patient does get put on bupropion for depression this should help with his smoking cessation as well.  History of alcohol use disorder Patient states that he no longer drinks liquor.  States that he  was a daily drinker at one point, but only drinks wine once in a "blue moon".  This was likely the cause of his pancreatitis.  He has stopped alcohol since that episode of pancreatitis.  Encouraged patient to continue alcohol cessation.  Bilateral shoulder pain Patient describes episodes lasting 2 to 3 minutes of shoulder pain that travels down to his chest that make him feel "weak and "slumped down".  No inciting factors, no prodromal symptoms.  Sudden onset and resolution.  Uncertain of what these actually are based on  patient's description.  First time he experienced them he went to the hospital to get worked up for myocardial infarction which was apparently negative.  Leading hypothesis is that this is a manifestation of anxiety disorder.  We will continue to monitor, but no further work-up for now.  Routine screening for STI (sexually transmitted infection) Patient had a recent new sexual partner for which she had unprotected intercourse 1 time.  Had 1 transient episode of "burning" of his genitalia not associated with urination. - STI testing: HIV, RPR, GC, chlamydia    Frederic Jerichoan , MD PGY-2

## 2018-07-24 LAB — LIPID PANEL
Chol/HDL Ratio: 4.1 ratio (ref 0.0–5.0)
Cholesterol, Total: 193 mg/dL (ref 100–199)
HDL: 47 mg/dL (ref >39)
LDL Calculated: 120 mg/dL — ABNORMAL HIGH (ref 0–99)
Triglycerides: 132 mg/dL (ref 0–149)
VLDL Cholesterol Cal: 26 mg/dL (ref 5–40)

## 2018-07-24 LAB — RPR: RPR Ser Ql: NONREACTIVE

## 2018-07-24 LAB — HIV ANTIBODY (ROUTINE TESTING W REFLEX): HIV Screen 4th Generation wRfx: NONREACTIVE

## 2018-07-24 LAB — URINE CYTOLOGY ANCILLARY ONLY
Chlamydia: NEGATIVE
Neisseria Gonorrhea: NEGATIVE

## 2018-07-25 DIAGNOSIS — F324 Major depressive disorder, single episode, in partial remission: Secondary | ICD-10-CM | POA: Insufficient documentation

## 2018-07-25 DIAGNOSIS — M25512 Pain in left shoulder: Secondary | ICD-10-CM | POA: Insufficient documentation

## 2018-07-25 DIAGNOSIS — Z113 Encounter for screening for infections with a predominantly sexual mode of transmission: Secondary | ICD-10-CM | POA: Insufficient documentation

## 2018-07-25 DIAGNOSIS — Z87898 Personal history of other specified conditions: Secondary | ICD-10-CM | POA: Insufficient documentation

## 2018-07-25 DIAGNOSIS — M25511 Pain in right shoulder: Secondary | ICD-10-CM

## 2018-07-25 DIAGNOSIS — Z8659 Personal history of other mental and behavioral disorders: Secondary | ICD-10-CM | POA: Insufficient documentation

## 2018-07-25 HISTORY — DX: Pain in right shoulder: M25.511

## 2018-07-25 HISTORY — DX: Pain in left shoulder: M25.512

## 2018-07-25 NOTE — Assessment & Plan Note (Signed)
Patient says his epigastric symptoms have resolved.  They have not recurred since his last visit in diagnosis.  He has stopped drinking liquor and only occasionally drinks wine with dinner.  He does not take NSAIDs regularly.  Likely this was due to his alcohol use.  Patient seems very motivated to continue to not drink alcohol excessively. - Lipid panel to look for elevated cholesterol and hypertriglyceridemia. -Advised patient to continue to limit alcohol use and to avoid prolonged NSAID use.

## 2018-07-25 NOTE — Assessment & Plan Note (Signed)
Patient describes episodes lasting 2 to 3 minutes of shoulder pain that travels down to his chest that make him feel "weak and "slumped down".  No inciting factors, no prodromal symptoms.  Sudden onset and resolution.  Uncertain of what these actually are based on patient's description.  First time he experienced them he went to the hospital to get worked up for myocardial infarction which was apparently negative.  Leading hypothesis is that this is a manifestation of anxiety disorder.  We will continue to monitor, but no further work-up for now.

## 2018-07-25 NOTE — Assessment & Plan Note (Signed)
Patient actively trying to quit smoking.  Using nicotine patch currently, will only occasionally have days where he has a cigarette or 2.  Encourage patient to continue smoking cessation efforts.  If patient does get put on bupropion for depression this should help with his smoking cessation as well.

## 2018-07-25 NOTE — Assessment & Plan Note (Signed)
Patient had a recent new sexual partner for which she had unprotected intercourse 1 time.  Had 1 transient episode of "burning" of his genitalia not associated with urination. - STI testing: HIV, RPR, GC, chlamydia

## 2018-07-25 NOTE — Assessment & Plan Note (Signed)
Patient states that he no longer drinks liquor.  States that he was a daily drinker at one point, but only drinks wine once in a "blue moon".  This was likely the cause of his pancreatitis.  He has stopped alcohol since that episode of pancreatitis.  Encouraged patient to continue alcohol cessation.

## 2018-08-03 ENCOUNTER — Other Ambulatory Visit: Payer: Self-pay | Admitting: Family Medicine

## 2018-08-03 MED ORDER — ATORVASTATIN CALCIUM 40 MG PO TABS: 40.0000 mg | ORAL_TABLET | Freq: Every day | ORAL | 3 refills | Status: DC

## 2018-08-03 NOTE — Progress Notes (Signed)
Called patient to discuss the results of his lab work.  No answer, left voicemail.  Informed patient that the results of his tests were negative, but that his cholesterol test did show elevated cholesterol and given his risk factors we would like to start him on a statin.  Told patient that I went ahead and prescribed a statin, but that I would like to talk with him more about it over the phone or in person.  Advised him to call the office to make an appointment or to let them know a good time time to call on the phone would be.  Started patient on atorvastatin 40 mg nightly.  Patient had recent CMP less than 2 months ago that showed normal LFT.

## 2018-08-14 ENCOUNTER — Emergency Department (HOSPITAL_COMMUNITY)
Admission: EM | Admit: 2018-08-14 | Discharge: 2018-08-15 | Disposition: A | Discharge status: Home / Self Care | Payer: Medicaid Other | Attending: Emergency Medicine | Admitting: Emergency Medicine

## 2018-08-14 ENCOUNTER — Other Ambulatory Visit: Payer: Self-pay

## 2018-08-14 ENCOUNTER — Encounter (HOSPITAL_COMMUNITY): Payer: Self-pay | Admitting: Emergency Medicine

## 2018-08-14 DIAGNOSIS — F17210 Nicotine dependence, cigarettes, uncomplicated: Secondary | ICD-10-CM | POA: Diagnosis not present

## 2018-08-14 DIAGNOSIS — R197 Diarrhea, unspecified: Secondary | ICD-10-CM

## 2018-08-14 DIAGNOSIS — Z79899 Other long term (current) drug therapy: Secondary | ICD-10-CM | POA: Insufficient documentation

## 2018-08-14 DIAGNOSIS — R110 Nausea: Secondary | ICD-10-CM | POA: Insufficient documentation

## 2018-08-14 LAB — COMPREHENSIVE METABOLIC PANEL
ALT: 20 U/L (ref 0–44)
AST: 21 U/L (ref 15–41)
Albumin: 3.8 g/dL (ref 3.5–5.0)
Alkaline Phosphatase: 78 U/L (ref 38–126)
Anion gap: 8 (ref 5–15)
BUN: 14 mg/dL (ref 8–23)
CO2: 27 mmol/L (ref 22–32)
Calcium: 9.6 mg/dL (ref 8.9–10.3)
Chloride: 103 mmol/L (ref 98–111)
Creatinine, Ser: 1.09 mg/dL (ref 0.61–1.24)
GFR calc Af Amer: >60 mL/min (ref >60)
GFR calc non Af Amer: >60 mL/min (ref >60)
Glucose, Bld: 99 mg/dL (ref 70–99)
Potassium: 4.2 mmol/L (ref 3.5–5.1)
Sodium: 138 mmol/L (ref 135–145)
Total Bilirubin: 0.5 mg/dL (ref 0.3–1.2)
Total Protein: 7.6 g/dL (ref 6.5–8.1)

## 2018-08-14 LAB — CBC
HCT: 49.4 % (ref 39.0–52.0)
Hemoglobin: 16 g/dL (ref 13.0–17.0)
MCH: 30.8 pg (ref 26.0–34.0)
MCHC: 32.4 g/dL (ref 30.0–36.0)
MCV: 95.2 fL (ref 80.0–100.0)
Platelets: 210 10*3/uL (ref 150–400)
RBC: 5.19 MIL/uL (ref 4.22–5.81)
RDW: 13.7 % (ref 11.5–15.5)
WBC: 9 10*3/uL (ref 4.0–10.5)
nRBC: 0 % (ref 0.0–0.2)

## 2018-08-14 LAB — URINALYSIS, ROUTINE W REFLEX MICROSCOPIC
Bilirubin Urine: NEGATIVE
Glucose, UA: NEGATIVE mg/dL
Hgb urine dipstick: NEGATIVE
Ketones, ur: NEGATIVE mg/dL
Leukocytes,Ua: NEGATIVE
Nitrite: NEGATIVE
Protein, ur: NEGATIVE mg/dL
Specific Gravity, Urine: 1.026 (ref 1.005–1.030)
pH: 5 (ref 5.0–8.0)

## 2018-08-14 LAB — LIPASE, BLOOD: Lipase: 31 U/L (ref 11–51)

## 2018-08-14 MED ORDER — SODIUM CHLORIDE 0.9% FLUSH: 3.0000 mL | Freq: Once | INTRAVENOUS | Status: DC

## 2018-08-14 NOTE — ED Triage Notes (Signed)
Pt c/o nausea and diarrhea since eating at [employer] x 4 days ago

## 2018-08-15 ENCOUNTER — Encounter (HOSPITAL_COMMUNITY): Payer: Self-pay | Admitting: Student

## 2018-08-15 MED ORDER — DICYCLOMINE HCL 20 MG PO TABS: 20.0000 mg | ORAL_TABLET | Freq: Three times a day (TID) | ORAL | 0 refills | Status: DC | PRN

## 2018-08-15 MED ORDER — ONDANSETRON 4 MG PO TBDP: 4.0000 mg | ORAL_TABLET | Freq: Three times a day (TID) | ORAL | 0 refills | Status: DC | PRN

## 2018-08-15 NOTE — Discharge Instructions (Signed)
You were seen in the emergency department today for diarrhea.  Your blood work was reassuring.  We are sending you home with medicines to help treat your symptoms: -Zofran: This is a medicine to help with nausea and vomiting, take every 8 hours as needed -Bentyl: This is a medicine to help with abdominal cramping, take every 8 hours as needed.  We have prescribed you new medication(s) today. Discuss the medications prescribed today with your pharmacist as they can have adverse effects and interactions with your other medicines including over the counter and prescribed medications. Seek medical evaluation if you start to experience new or abnormal symptoms after taking one of these medicines, seek care immediately if you start to experience difficulty breathing, feeling of your throat closing, facial swelling, or rash as these could be indications of a more serious allergic reaction  Please follow attached diet guidelines.  Please follow-up with primary care within 3 to 5 days for reevaluation.  Return to the ER for new or worsening symptoms including but not limited to blood in your diarrhea, dark or tarry stools, fever, persistent abdominal pain, inability to keep fluids down, or any other concerns.
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
## 2018-09-08 ENCOUNTER — Ambulatory Visit: Payer: Medicaid Other | Admitting: Family Medicine

## 2018-11-23 ENCOUNTER — Encounter (HOSPITAL_COMMUNITY): Payer: Self-pay

## 2018-11-23 ENCOUNTER — Other Ambulatory Visit: Payer: Self-pay

## 2018-11-23 ENCOUNTER — Ambulatory Visit (HOSPITAL_COMMUNITY)
Admission: EM | Admit: 2018-11-23 | Discharge: 2018-11-23 | Disposition: A | Discharge status: Home / Self Care | Payer: Medicaid Other | Attending: Emergency Medicine | Admitting: Emergency Medicine

## 2018-11-23 DIAGNOSIS — H60332 Swimmer's ear, left ear: Secondary | ICD-10-CM | POA: Diagnosis not present

## 2018-11-23 MED ORDER — NEOMYCIN-POLYMYXIN-HC 3.5-10000-1 OT SUSP: 4.0000 [drp] | Freq: Three times a day (TID) | OTIC | 0 refills | Status: DC

## 2018-11-23 NOTE — Discharge Instructions (Signed)
Please use cortisporin every 8 hours in left ear Follow up if symptoms not improving

## 2018-11-23 NOTE — ED Triage Notes (Signed)
Pt. States he has developed left ear pain 2 days ago.
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
## 2018-11-28 ENCOUNTER — Other Ambulatory Visit: Payer: Self-pay

## 2018-11-28 ENCOUNTER — Ambulatory Visit (INDEPENDENT_AMBULATORY_CARE_PROVIDER_SITE_OTHER): Payer: Medicaid Other | Admitting: Family Medicine

## 2018-11-28 ENCOUNTER — Encounter: Payer: Self-pay | Admitting: Family Medicine

## 2018-11-28 DIAGNOSIS — Z8659 Personal history of other mental and behavioral disorders: Secondary | ICD-10-CM | POA: Diagnosis present

## 2018-11-28 DIAGNOSIS — E785 Hyperlipidemia, unspecified: Secondary | ICD-10-CM | POA: Diagnosis not present

## 2018-11-28 DIAGNOSIS — F17200 Nicotine dependence, unspecified, uncomplicated: Secondary | ICD-10-CM | POA: Diagnosis not present

## 2018-11-28 MED ORDER — ATORVASTATIN CALCIUM 40 MG PO TABS: 40.0000 mg | ORAL_TABLET | Freq: Every day | ORAL | 3 refills | Status: DC

## 2018-11-28 MED ORDER — BUPROPION HCL ER (XL) 150 MG PO TB24: 150.0000 mg | ORAL_TABLET | Freq: Every day | ORAL | 1 refills | Status: AC

## 2018-11-28 MED ORDER — NICOTINE 14 MG/24HR TD PT24: 14.0000 mg | MEDICATED_PATCH | Freq: Every day | TRANSDERMAL | 0 refills | Status: DC

## 2018-11-28 NOTE — Progress Notes (Deleted)
   [facility] Phone: [phone]     Harold Colon - 63 y.o. male MRN [id number]  Date of birth: 1955-03-30  Subjective:   cc: ***  HPI:  Smoking - 1 ppd.    Depression - family members died.   Cholesterol - never taken.   ROS: See HPI for pertinent positives and negatives  Family history reviewed for today's visit. No changes.  Social history- patient is a *** smoker  Health Maintenance:  - *** Health Maintenance Due  Topic Date Due  . INFLUENZA VACCINE  08/09/2018    -  reports that he has been smoking cigarettes. He has been smoking about 0.50 packs per day. He has never used smokeless tobacco.  Objective:   There were no vitals taken for this visit. Gen: *** HEENT: *** Neck: *** CV: *** Resp: *** GI: *** Msk: *** Neuro: *** Skin: *** Psych: ***  Assessment/Plan:   No problem-specific Assessment & Plan notes found for this encounter.     Clemetine Marker, MD PGY-2 [facility]

## 2018-11-28 NOTE — Patient Instructions (Signed)
It was great to see you again today,  I have prescribed you a nicotine patch to help you quit smoking.  I have also prescribed Wellbutrin which can help with nicotine cravings.  The phone number for the smoking quit line is 1-800-quit-now.  If you fill out the information they ask of you, you can get a 1 month supply of nicotine replacement therapy.  The Wellbutrin for your depression should be taken once a day.  You can take this at night along with your Lipitor.  If for any reason you get muscle aches, pain in your belly under your right side of your rib cage, these are reason to stop the Lipitor, and call our office.  If you would also like to have access to therapy resources, the best place to go for that is [facility] [place] which is located on [address] can walk-in and set up an appointment with them.  They take all kinds of insurance.  Have a great day,  Clemetine Marker, MD

## 2018-11-28 NOTE — Progress Notes (Signed)
   [facility] Phone: [phone]     Harold Colon - 63 y.o. male MRN [id number]  Date of birth: 11/12/1955  Subjective:   cc: Depression, smoking cessation, hyperlipidemia  HPI:  Smoking -patient smokes approximately 1 pack/day.  Has been trying to quit since her last visit.  He has tried nicotine patch in for which worked for a brief time.  He has no history of seizures.  Depression -patient still states he is feeling depressed.  Depression related to the death of his family members.  Now has another brother who is also become sick with a heart condition since last time we spoke.  Patient feels like you program with "get through the holidays".  Cholesterol -patient states he did not realize he was prescribed a statin after his last visit.  Patient states he has been exercising more and trying to eat less fried foods.  States he is going to get an air Rolly Salter and use that instead of deep frying foods.  ROS: See HPI for pertinent positives and negatives  Family history reviewed for today's visit. No changes.  Social history- patient is a current smoker   Objective:   BP 118/78   Pulse 83   Wt 254 lb 12.8 oz (115.6 kg)   SpO2 98%   BMI 33.62 kg/m  Gen: Alert and oriented.  No acute distress. CV: Regular rate and rhythm.  No murmur. Resp: Lungs clear to auscultation bilaterally.  No crackles, no wheezes  Assessment/Plan:   History of depression Patient states he feels depressed and that the holidays are the worst time of year for him due to the deaths of his family members.  Has taken bupropion before without side effects. -Wellbutrin XL 150 mg daily. -Follow-up in 6 weeks -Gave patient information for family services [facility].  Tobacco use disorder Patient still desires to quit smoking.  Has had some success with nicotine patches previously.  States he wants to try the patch again and not the gum. -Nicotine patch. -Wellbutrin  should help with cravings. -Gave patient information for smoking quit line  HLD (hyperlipidemia) Patient never took the statin prescribed during last visit.  Discussed with patient the benefits of diet and exercise, specifically reducing saturated fat.  Discussed side effects of medication with patient -Atorvastatin 40 mg daily   Clemetine Marker, MD PGY-2 [facility]

## 2018-11-30 NOTE — Assessment & Plan Note (Signed)
Patient never took the statin prescribed during last visit.  Discussed with patient the benefits of diet and exercise, specifically reducing saturated fat.  Discussed side effects of medication with patient -Atorvastatin 40 mg daily

## 2018-11-30 NOTE — Assessment & Plan Note (Signed)
Patient still desires to quit smoking.  Has had some success with nicotine patches previously.  States he wants to try the patch again and not the gum. -Nicotine patch. -Wellbutrin should help with cravings. -Gave patient information for smoking quit line

## 2018-11-30 NOTE — Assessment & Plan Note (Signed)
Patient states he feels depressed and that the holidays are the worst time of year for him due to the deaths of his family members.  Has taken bupropion before without side effects. -Wellbutrin XL 150 mg daily. -Follow-up in 6 weeks -Gave patient information for family services [facility].

## 2019-01-30 ENCOUNTER — Encounter: Payer: Self-pay | Admitting: Family Medicine

## 2019-01-30 ENCOUNTER — Ambulatory Visit (INDEPENDENT_AMBULATORY_CARE_PROVIDER_SITE_OTHER): Payer: Medicaid Other | Admitting: Family Medicine

## 2019-01-30 ENCOUNTER — Other Ambulatory Visit: Payer: Self-pay

## 2019-01-30 VITALS — BP 116/78 | HR 62 | Wt 251.2 lb

## 2019-01-30 DIAGNOSIS — M8949 Other hypertrophic osteoarthropathy, multiple sites: Secondary | ICD-10-CM

## 2019-01-30 DIAGNOSIS — F324 Major depressive disorder, single episode, in partial remission: Secondary | ICD-10-CM | POA: Diagnosis not present

## 2019-01-30 DIAGNOSIS — M150 Primary generalized (osteo)arthritis: Secondary | ICD-10-CM

## 2019-01-30 DIAGNOSIS — E785 Hyperlipidemia, unspecified: Secondary | ICD-10-CM | POA: Diagnosis not present

## 2019-01-30 DIAGNOSIS — K089 Disorder of teeth and supporting structures, unspecified: Secondary | ICD-10-CM

## 2019-01-30 DIAGNOSIS — M159 Polyosteoarthritis, unspecified: Secondary | ICD-10-CM

## 2019-01-30 DIAGNOSIS — F17200 Nicotine dependence, unspecified, uncomplicated: Secondary | ICD-10-CM | POA: Diagnosis not present

## 2019-01-30 MED ORDER — NICOTINE 14 MG/24HR TD PT24: 14.0000 mg | MEDICATED_PATCH | Freq: Every day | TRANSDERMAL | 0 refills | Status: DC

## 2019-01-30 NOTE — Patient Instructions (Addendum)
It was nice to see you again today,  I have ordered a LDL test to check your cholesterol levels.  If we need to increase your cholesterol medicine, I will call you to let you know.  I would like to get x-rays of your knees.  You can go into the imaging center at [facility] imaging at either location to get these performed.  I would call ahead of time to make sure it is okay to come in.  I have given you a piece of paper with there address and phone number.  I have given you the nicotine patch prescription which you can pick up at your pharmacy.  You can also call the 1 800 quit now phone number that I have also attached and you can get a free month prescription for nicotine replacement from them as well.  I would like to see you back in 3 months.  Have a great day,  Frederic Jericho, MD

## 2019-01-30 NOTE — Progress Notes (Signed)
[name] Phone: [phone]     Harold Colon - 64 y.o. male MRN [id number]  Date of birth: [birth year]  Subjective:   cc: Depression,   HPI:  Depression: Patient states he has been overall feeling better.  Reports being compliant with his medication.  Patient would like to stay on his dose of medication.  No thoughts of hurting himself or others..  Hyperlipidemia: Patient is compliant with his cholesterol medication.  No RUQ, no muscle pain.   Bilateral knee pain: Patient reports it is getting better.  He has been trying to exercise more.  His left leg used to "give out", but has not in recent memory..  Used to take hydrocodone but now he takes Advil only "every once in a while".  Leg does not lock, but he does hear a "pop" sometimes when he is moving.   Smoking: smokes a 1.5 ppd to 1 ppd.  Wants to quit.  Wants the patch.  Has tried the gum before in the past and does not like it.  Mouth "infection": At the end of our visit, the patient states he believes he has a "mouth infection" and was complaining about halitosis.  States he goes to a [employer] community regularly.  No infection was noted there he states.  He does not have any swelling of the gums or buccal region and does not report any bleeding or pus coming from the mouth.  No fevers.  Only moderate pain.  No personal hx of heart disease.    ROS: See HPI for pertinent positives and negatives  Family history reviewed for today's visit. No changes.  Social history- patient is a current smoker   Objective:   BP 116/78   Pulse 62   Wt 251 lb 3.2 oz (113.9 kg)   SpO2 99%   BMI 33.14 kg/m  Gen: Alert and oriented.  No acute distress. HEENT: Poor dentition.  Several molars have had dental work for cavities.  No erythema, swelling, signs of infection noted in the oropharynx or buccal mucosa.  No tenderness palpation of the cheeks.  Tongue appears normal. Neck: No cervical lymphadenopathy. CV: Regular  rhythm, normal rate.  No murmurs.  2+ radial pulse. Resp: Lungs clear to auscultation bilaterally.  No wheezes/crackles. Msk: No swelling of the knee joints.  Normal range of motion for internal and external rotation of the femur.  Strength normal and equal bilaterally in the lower extremities.  Normal valgus/varus stress.  Thessaly test negative on right, equivocal and left.   Assessment/Plan:   Depression, major, single episode, in partial remission [facility]) Patient feels mood has improved since taking Wellbutrin consistently on a daily basis, PHQ-9 scores reflect that..  Patient like to continue at this current dose. -Continue Wellbutrin 150 XL.  Tobacco use disorder Patient states again he would like to quit smoking.  Is at about 1 pack/day.  He did not pick up a prescription for nicotine patch last time and did not call the quit line. -Refill prescription for 14 mg patch daily -Gave patient the number for smoking cessation hotline.  Primary osteoarthritis involving multiple joints Patient still complains of bilateral knee pain, although states it is feeling better.  He has engaged in some physical activity, but would like to "get back into the gym".  Currently only taking ibuprofen occasionally for pain.  Physical exam of the knees was largely normal.  I had not seen a recent x-ray of the patient's knees going backs multiple years. -  Bilateral x-ray of knees to evaluate for severity/progression of arthritis.  Poor dentition No infection or gingivitis noted.  Patient has regular dentists.  Encourage patient to continue going to dentist.  For complaints of halitosis, no tonsillar stones were identified.  Advised patient to brush after every meal and to use non alcohol-containing mouthwash to help with this symptom.  HLD (hyperlipidemia) Patient reports compliance with medication.  No side effects. -Direct LDL measurement -Adjust statin if necessary after following up LDL result.   Clemetine Marker, MD PGY-2 [facility]

## 2019-01-31 DIAGNOSIS — K089 Disorder of teeth and supporting structures, unspecified: Secondary | ICD-10-CM | POA: Insufficient documentation

## 2019-01-31 DIAGNOSIS — M159 Polyosteoarthritis, unspecified: Secondary | ICD-10-CM | POA: Insufficient documentation

## 2019-01-31 LAB — LDL CHOLESTEROL, DIRECT: LDL Direct: 105 mg/dL — ABNORMAL HIGH (ref 0–99)

## 2019-01-31 NOTE — Assessment & Plan Note (Signed)
No infection or gingivitis noted.  Patient has regular dentists.  Encourage patient to continue going to dentist.  For complaints of halitosis, no tonsillar stones were identified.  Advised patient to brush after every meal and to use non alcohol-containing mouthwash to help with this symptom.

## 2019-01-31 NOTE — Assessment & Plan Note (Signed)
Patient reports compliance with medication.  No side effects. -Direct LDL measurement -Adjust statin if necessary after following up LDL result.

## 2019-01-31 NOTE — Assessment & Plan Note (Signed)
Patient states again he would like to quit smoking.  Is at about 1 pack/day.  He did not pick up a prescription for nicotine patch last time and did not call the quit line. -Refill prescription for 14 mg patch daily -Gave patient the number for smoking cessation hotline.

## 2019-01-31 NOTE — Assessment & Plan Note (Addendum)
Patient still complains of bilateral knee pain, although states it is feeling better.  He has engaged in some physical activity, but would like to "get back into the gym".  Currently only taking ibuprofen occasionally for pain.  Physical exam of the knees was largely normal.  I had not seen a recent x-ray of the patient's knees going backs multiple years. -Bilateral x-ray of knees to evaluate for severity/progression of arthritis.

## 2019-01-31 NOTE — Assessment & Plan Note (Signed)
Patient feels mood has improved since taking Wellbutrin consistently on a daily basis, PHQ-9 scores reflect that..  Patient like to continue at this current dose. -Continue Wellbutrin 150 XL.

## 2019-02-02 ENCOUNTER — Other Ambulatory Visit: Payer: Self-pay | Admitting: Family Medicine

## 2019-02-02 MED ORDER — ATORVASTATIN CALCIUM 80 MG PO TABS: 80.0000 mg | ORAL_TABLET | Freq: Every day | ORAL | 2 refills | Status: AC

## 2019-02-02 NOTE — Progress Notes (Signed)
Increased d/t LDL being still slightly elevated while adherent to 40mg  daily.

## 2019-02-03 NOTE — Progress Notes (Signed)
LVM for pt to call the office as we have some information on his medication to inform him of. Sunday Spillers, CMA

## 2019-02-04 NOTE — Progress Notes (Signed)
Informed patient of below.   Percy Comp C Keishawna Carranza, RN  

## 2019-04-24 ENCOUNTER — Other Ambulatory Visit: Payer: Self-pay

## 2019-04-24 ENCOUNTER — Ambulatory Visit (HOSPITAL_COMMUNITY)
Admission: EM | Admit: 2019-04-24 | Discharge: 2019-04-24 | Disposition: A | Discharge status: Home / Self Care | Payer: Medicaid Other | Attending: Family Medicine | Admitting: Family Medicine

## 2019-04-24 ENCOUNTER — Encounter (HOSPITAL_COMMUNITY): Payer: Self-pay

## 2019-04-24 DIAGNOSIS — T148XXA Other injury of unspecified body region, initial encounter: Secondary | ICD-10-CM

## 2019-04-24 MED ORDER — CYCLOBENZAPRINE HCL 10 MG PO TABS: 10.0000 mg | ORAL_TABLET | Freq: Two times a day (BID) | ORAL | 0 refills | Status: DC | PRN

## 2019-04-24 MED ORDER — METHYLPREDNISOLONE SODIUM SUCC 125 MG IJ SOLR
INTRAMUSCULAR | Status: AC
  Filled 2019-04-24: qty 2

## 2019-04-24 MED ORDER — METHYLPREDNISOLONE SODIUM SUCC 125 MG IJ SOLR
125.0000 mg | Freq: Once | INTRAMUSCULAR | Status: AC
  Administered 2019-04-24: 12:00:00 125 mg via INTRAMUSCULAR

## 2019-04-24 MED ORDER — KETOROLAC TROMETHAMINE 30 MG/ML IJ SOLN
30.0000 mg | Freq: Once | INTRAMUSCULAR | Status: AC
  Administered 2019-04-24: 30 mg via INTRAMUSCULAR

## 2019-04-24 MED ORDER — KETOROLAC TROMETHAMINE 30 MG/ML IJ SOLN
INTRAMUSCULAR | Status: AC
  Filled 2019-04-24: qty 1

## 2019-04-24 NOTE — Discharge Instructions (Addendum)
Okay to take Tylenol 500 mg every 4-6 hours as needed if breakthrough pain occurs.  I have prescribed cyclobenzaprine 10 mg she may take twice daily as needed for back pain.  Keep in mind this medication can cause significant drowsiness.  Be sure to get your second Covid vaccine at the designated time that was provided to you at the vaccine site.
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
## 2019-04-24 NOTE — ED Triage Notes (Signed)
Pt states he has back pain x 1 week.

## 2019-04-27 ENCOUNTER — Ambulatory Visit (HOSPITAL_COMMUNITY)
Admission: EM | Admit: 2019-04-27 | Discharge: 2019-04-27 | Disposition: A | Discharge status: Home / Self Care | Payer: Medicaid Other | Attending: Internal Medicine | Admitting: Internal Medicine

## 2019-04-27 ENCOUNTER — Other Ambulatory Visit: Payer: Self-pay

## 2019-04-27 DIAGNOSIS — L255 Unspecified contact dermatitis due to plants, except food: Secondary | ICD-10-CM | POA: Diagnosis not present

## 2019-04-27 MED ORDER — CALAMINE EX LOTN: 1.0000 "application " | TOPICAL_LOTION | CUTANEOUS | 0 refills | Status: AC | PRN

## 2019-04-27 MED ORDER — HYDROXYZINE HCL 25 MG PO TABS: 25.0000 mg | ORAL_TABLET | Freq: Four times a day (QID) | ORAL | 0 refills | Status: DC
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
## 2019-04-27 NOTE — ED Triage Notes (Signed)
Pt c/o poison ivy rash to bilateral arms. Concerned it is getting worse after getting COVID vaccine.

## 2019-05-03 ENCOUNTER — Ambulatory Visit (HOSPITAL_COMMUNITY)
Admission: EM | Admit: 2019-05-03 | Discharge: 2019-05-03 | Disposition: A | Discharge status: Home / Self Care | Payer: Medicaid Other | Attending: Urgent Care | Admitting: Urgent Care

## 2019-05-03 ENCOUNTER — Encounter (HOSPITAL_COMMUNITY): Payer: Self-pay | Admitting: Emergency Medicine

## 2019-05-03 ENCOUNTER — Other Ambulatory Visit: Payer: Self-pay

## 2019-05-03 DIAGNOSIS — R21 Rash and other nonspecific skin eruption: Secondary | ICD-10-CM | POA: Diagnosis not present

## 2019-05-03 DIAGNOSIS — L299 Pruritus, unspecified: Secondary | ICD-10-CM

## 2019-05-03 DIAGNOSIS — L255 Unspecified contact dermatitis due to plants, except food: Secondary | ICD-10-CM

## 2019-05-03 MED ORDER — PREDNISONE 20 MG PO TABS: ORAL_TABLET | ORAL | 0 refills | Status: DC

## 2019-05-03 MED ORDER — VALACYCLOVIR HCL 1 G PO TABS: 1000.0000 mg | ORAL_TABLET | Freq: Three times a day (TID) | ORAL | 0 refills | Status: AC

## 2019-05-03 MED ORDER — CETIRIZINE HCL 10 MG PO TABS: 10.0000 mg | ORAL_TABLET | Freq: Every day | ORAL | 0 refills | Status: DC

## 2019-05-03 NOTE — Discharge Instructions (Addendum)
I suspect that you have shingles over your back which is a rash that we can treat with Valtrex 3 times a day for the next 2 weeks.  However, you do still have your poison ivy rash and normally we would not use steroids while being treated for shingles rash but given the worsening poison ivy rash we will use just 5 days of prednisone.  You can continue to use hydroxyzine for itching but I am also going to prescribe Zyrtec.

## 2019-05-03 NOTE — ED Triage Notes (Signed)
Seen 4/19 for poison ivy. Reports it has spread and is much worse. He has been taking benadryl
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
## 2019-06-10 ENCOUNTER — Ambulatory Visit: Payer: Medicaid Other | Admitting: Family Medicine

## 2019-06-10 ENCOUNTER — Other Ambulatory Visit: Payer: Self-pay

## 2019-06-10 NOTE — Progress Notes (Deleted)
    SUBJECTIVE:   CHIEF COMPLAINT / HPI:   Depression:   Smoking:   Arthritis: knees  HLD: what dose is he taking?   PERTINENT  PMH / PSH: ***  OBJECTIVE:   There were no vitals taken for this visit.  ***  ASSESSMENT/PLAN:   No problem-specific Assessment & Plan notes found for this encounter.     Sandre Kitty, MD [facility]

## 2019-06-19 ENCOUNTER — Ambulatory Visit (HOSPITAL_COMMUNITY)
Admission: EM | Admit: 2019-06-19 | Discharge: 2019-06-19 | Disposition: A | Discharge status: Home / Self Care | Payer: Medicaid Other | Attending: Emergency Medicine | Admitting: Emergency Medicine

## 2019-06-19 ENCOUNTER — Encounter (HOSPITAL_COMMUNITY): Payer: Self-pay

## 2019-06-19 ENCOUNTER — Other Ambulatory Visit: Payer: Self-pay

## 2019-06-19 DIAGNOSIS — J22 Unspecified acute lower respiratory infection: Secondary | ICD-10-CM | POA: Diagnosis not present

## 2019-06-19 MED ORDER — BENZONATATE 200 MG PO CAPS: 200.0000 mg | ORAL_CAPSULE | Freq: Three times a day (TID) | ORAL | 0 refills | Status: AC | PRN

## 2019-06-19 MED ORDER — ALBUTEROL SULFATE HFA 108 (90 BASE) MCG/ACT IN AERS: 1.0000 | INHALATION_SPRAY | Freq: Four times a day (QID) | RESPIRATORY_TRACT | 0 refills | Status: DC | PRN

## 2019-06-19 MED ORDER — PREDNISONE 50 MG PO TABS: 50.0000 mg | ORAL_TABLET | Freq: Every day | ORAL | 0 refills | Status: AC

## 2019-06-19 MED ORDER — DOXYCYCLINE HYCLATE 100 MG PO CAPS: 100.0000 mg | ORAL_CAPSULE | Freq: Two times a day (BID) | ORAL | 0 refills | Status: AC

## 2019-06-19 MED ORDER — DM-GUAIFENESIN ER 30-600 MG PO TB12: 1.0000 | ORAL_TABLET | Freq: Two times a day (BID) | ORAL | 0 refills | Status: DC

## 2019-06-19 NOTE — Discharge Instructions (Signed)
Begin doxycycline twice daily for 10 days Prednisone daily with breakfast for 5 days Albuterol inhaler as needed for chest tightness, shortness of breath Tessalon/benzonatate every 8 hours for cough Mucinex DM twice daily to help break up congestion and cough Rest and fluids   Please follow-up if any symptoms not improving or worsening

## 2019-06-19 NOTE — ED Triage Notes (Signed)
Pt presents with wheezing, chest tightness and cough; pt states he has asthma but does not have an inhaler.
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
## 2019-07-08 ENCOUNTER — Ambulatory Visit (HOSPITAL_COMMUNITY)
Admission: EM | Admit: 2019-07-08 | Discharge: 2019-07-08 | Disposition: A | Discharge status: Home / Self Care | Payer: Medicaid Other | Source: Home / Self Care

## 2019-07-08 ENCOUNTER — Observation Stay (HOSPITAL_COMMUNITY)
Admission: EM | Admit: 2019-07-08 | Discharge: 2019-07-09 | Disposition: A | Discharge status: Still In Hospital | Payer: Medicaid Other | Attending: Family Medicine | Admitting: Family Medicine

## 2019-07-08 ENCOUNTER — Encounter (HOSPITAL_COMMUNITY): Payer: Self-pay | Admitting: Pediatrics

## 2019-07-08 ENCOUNTER — Other Ambulatory Visit: Payer: Self-pay

## 2019-07-08 ENCOUNTER — Emergency Department (HOSPITAL_COMMUNITY): Payer: Medicaid Other

## 2019-07-08 DIAGNOSIS — F1510 Other stimulant abuse, uncomplicated: Secondary | ICD-10-CM | POA: Diagnosis not present

## 2019-07-08 DIAGNOSIS — I10 Essential (primary) hypertension: Secondary | ICD-10-CM | POA: Diagnosis not present

## 2019-07-08 DIAGNOSIS — R4701 Aphasia: Secondary | ICD-10-CM | POA: Diagnosis present

## 2019-07-08 DIAGNOSIS — F1011 Alcohol abuse, in remission: Secondary | ICD-10-CM | POA: Diagnosis not present

## 2019-07-08 DIAGNOSIS — F419 Anxiety disorder, unspecified: Secondary | ICD-10-CM | POA: Insufficient documentation

## 2019-07-08 DIAGNOSIS — F1411 Cocaine abuse, in remission: Secondary | ICD-10-CM | POA: Insufficient documentation

## 2019-07-08 DIAGNOSIS — E669 Obesity, unspecified: Secondary | ICD-10-CM | POA: Insufficient documentation

## 2019-07-08 DIAGNOSIS — Z6832 Body mass index (BMI) 32.0-32.9, adult: Secondary | ICD-10-CM | POA: Diagnosis not present

## 2019-07-08 DIAGNOSIS — G4733 Obstructive sleep apnea (adult) (pediatric): Secondary | ICD-10-CM | POA: Insufficient documentation

## 2019-07-08 DIAGNOSIS — Z79899 Other long term (current) drug therapy: Secondary | ICD-10-CM | POA: Diagnosis not present

## 2019-07-08 DIAGNOSIS — F17210 Nicotine dependence, cigarettes, uncomplicated: Secondary | ICD-10-CM | POA: Insufficient documentation

## 2019-07-08 DIAGNOSIS — L03011 Cellulitis of right finger: Secondary | ICD-10-CM | POA: Diagnosis not present

## 2019-07-08 DIAGNOSIS — Z20822 Contact with and (suspected) exposure to covid-19: Secondary | ICD-10-CM | POA: Diagnosis not present

## 2019-07-08 DIAGNOSIS — E785 Hyperlipidemia, unspecified: Secondary | ICD-10-CM | POA: Diagnosis not present

## 2019-07-08 DIAGNOSIS — M1990 Unspecified osteoarthritis, unspecified site: Secondary | ICD-10-CM | POA: Diagnosis not present

## 2019-07-08 DIAGNOSIS — K219 Gastro-esophageal reflux disease without esophagitis: Secondary | ICD-10-CM | POA: Diagnosis not present

## 2019-07-08 DIAGNOSIS — F324 Major depressive disorder, single episode, in partial remission: Secondary | ICD-10-CM | POA: Insufficient documentation

## 2019-07-08 DIAGNOSIS — G459 Transient cerebral ischemic attack, unspecified: Principal | ICD-10-CM | POA: Insufficient documentation

## 2019-07-08 LAB — I-STAT CHEM 8, ED
BUN: 14 mg/dL (ref 8–23)
Calcium, Ion: 1.21 mmol/L (ref 1.15–1.40)
Chloride: 105 mmol/L (ref 98–111)
Creatinine, Ser: 0.9 mg/dL (ref 0.61–1.24)
Glucose, Bld: 102 mg/dL — ABNORMAL HIGH (ref 70–99)
HCT: 48 % (ref 39.0–52.0)
Hemoglobin: 16.3 g/dL (ref 13.0–17.0)
Potassium: 3.5 mmol/L (ref 3.5–5.1)
Sodium: 144 mmol/L (ref 135–145)
TCO2: 26 mmol/L (ref 22–32)

## 2019-07-08 LAB — COMPREHENSIVE METABOLIC PANEL
ALT: 24 U/L (ref 0–44)
AST: 22 U/L (ref 15–41)
Albumin: 4.1 g/dL (ref 3.5–5.0)
Alkaline Phosphatase: 64 U/L (ref 38–126)
Anion gap: 9 (ref 5–15)
BUN: 12 mg/dL (ref 8–23)
CO2: 24 mmol/L (ref 22–32)
Calcium: 9.8 mg/dL (ref 8.9–10.3)
Chloride: 107 mmol/L (ref 98–111)
Creatinine, Ser: 1.01 mg/dL (ref 0.61–1.24)
GFR calc Af Amer: >60 mL/min (ref >60)
GFR calc non Af Amer: >60 mL/min (ref >60)
Glucose, Bld: 108 mg/dL — ABNORMAL HIGH (ref 70–99)
Potassium: 3.5 mmol/L (ref 3.5–5.1)
Sodium: 140 mmol/L (ref 135–145)
Total Bilirubin: 0.8 mg/dL (ref 0.3–1.2)
Total Protein: 7.6 g/dL (ref 6.5–8.1)

## 2019-07-08 LAB — DIFFERENTIAL
Abs Immature Granulocytes: 0.03 10*3/uL (ref 0.00–0.07)
Basophils Absolute: 0.1 10*3/uL (ref 0.0–0.1)
Basophils Relative: 1 %
Eosinophils Absolute: 0.3 10*3/uL (ref 0.0–0.5)
Eosinophils Relative: 3 %
Immature Granulocytes: 0 %
Lymphocytes Relative: 38 %
Lymphs Abs: 3.5 10*3/uL (ref 0.7–4.0)
Monocytes Absolute: 0.6 10*3/uL (ref 0.1–1.0)
Monocytes Relative: 7 %
Neutro Abs: 4.7 10*3/uL (ref 1.7–7.7)
Neutrophils Relative %: 51 %

## 2019-07-08 LAB — CBC
HCT: 48.7 % (ref 39.0–52.0)
Hemoglobin: 16.5 g/dL (ref 13.0–17.0)
MCH: 31.9 pg (ref 26.0–34.0)
MCHC: 33.9 g/dL (ref 30.0–36.0)
MCV: 94.2 fL (ref 80.0–100.0)
Platelets: 216 10*3/uL (ref 150–400)
RBC: 5.17 MIL/uL (ref 4.22–5.81)
RDW: 13.2 % (ref 11.5–15.5)
WBC: 9.1 10*3/uL (ref 4.0–10.5)
nRBC: 0 % (ref 0.0–0.2)

## 2019-07-08 LAB — APTT: aPTT: 30 seconds (ref 24–36)

## 2019-07-08 LAB — PROTIME-INR
INR: 1.1 (ref 0.8–1.2)
Prothrombin Time: 13.5 seconds (ref 11.4–15.2)

## 2019-07-08 MED ORDER — SODIUM CHLORIDE 0.9% FLUSH: 3.0000 mL | Freq: Once | INTRAVENOUS | Status: DC

## 2019-07-08 NOTE — ED Triage Notes (Signed)
Patient reported he had some slurred speech started last night at 8pm, and has now resolved.

## 2019-07-08 NOTE — ED Notes (Signed)
Patient is being discharged from the Urgent Care and sent to the Emergency Department via wheelchair . Per Wallis Bamberg, PA, patient is in need of higher level of care due to slurred speech. Patient is aware and verbalizes understanding of plan of care.  Vitals:   07/08/19 1534  BP: 109/75  Pulse: (!) 103  Resp: 16  Temp: 98 F (36.7 C)  SpO2: 100%

## 2019-07-08 NOTE — ED Triage Notes (Signed)
Pt c/o sudden onset dizziness, slurred speech and "it was like I couldn't take a step, like something was holding me back." episode lasting approx 5 min at 1900. States now feeling "sluggish and tired." no slurred speech at present.

## 2019-07-09 ENCOUNTER — Observation Stay (HOSPITAL_COMMUNITY): Payer: Medicaid Other

## 2019-07-09 ENCOUNTER — Emergency Department (HOSPITAL_COMMUNITY): Payer: Medicaid Other

## 2019-07-09 ENCOUNTER — Observation Stay (HOSPITAL_BASED_OUTPATIENT_CLINIC_OR_DEPARTMENT_OTHER): Payer: Medicaid Other

## 2019-07-09 DIAGNOSIS — G459 Transient cerebral ischemic attack, unspecified: Secondary | ICD-10-CM | POA: Diagnosis present

## 2019-07-09 DIAGNOSIS — R4781 Slurred speech: Secondary | ICD-10-CM | POA: Diagnosis not present

## 2019-07-09 DIAGNOSIS — F1410 Cocaine abuse, uncomplicated: Secondary | ICD-10-CM

## 2019-07-09 DIAGNOSIS — R471 Dysarthria and anarthria: Secondary | ICD-10-CM

## 2019-07-09 DIAGNOSIS — G118 Other hereditary ataxias: Secondary | ICD-10-CM | POA: Insufficient documentation

## 2019-07-09 DIAGNOSIS — F1910 Other psychoactive substance abuse, uncomplicated: Secondary | ICD-10-CM | POA: Insufficient documentation

## 2019-07-09 DIAGNOSIS — F17200 Nicotine dependence, unspecified, uncomplicated: Secondary | ICD-10-CM

## 2019-07-09 LAB — RAPID URINE DRUG SCREEN, HOSP PERFORMED
Amphetamines: POSITIVE — AB
Barbiturates: NOT DETECTED
Benzodiazepines: NOT DETECTED
Cocaine: POSITIVE — AB
Opiates: NOT DETECTED
Tetrahydrocannabinol: POSITIVE — AB

## 2019-07-09 LAB — LIPID PANEL
Cholesterol: 193 mg/dL (ref 0–200)
HDL: 47 mg/dL (ref >40)
LDL Cholesterol: 123 mg/dL — ABNORMAL HIGH (ref 0–99)
Total CHOL/HDL Ratio: 4.1 RATIO
Triglycerides: 115 mg/dL (ref <150)
VLDL: 23 mg/dL (ref 0–40)

## 2019-07-09 LAB — HEMOGLOBIN A1C
Hgb A1c MFr Bld: 6.4 % — ABNORMAL HIGH (ref 4.8–5.6)
Mean Plasma Glucose: 136.98 mg/dL

## 2019-07-09 LAB — BASIC METABOLIC PANEL
Anion gap: 6 (ref 5–15)
BUN: 13 mg/dL (ref 8–23)
CO2: 27 mmol/L (ref 22–32)
Calcium: 9.4 mg/dL (ref 8.9–10.3)
Chloride: 106 mmol/L (ref 98–111)
Creatinine, Ser: 0.89 mg/dL (ref 0.61–1.24)
GFR calc Af Amer: >60 mL/min (ref >60)
GFR calc non Af Amer: >60 mL/min (ref >60)
Glucose, Bld: 108 mg/dL — ABNORMAL HIGH (ref 70–99)
Potassium: 3.5 mmol/L (ref 3.5–5.1)
Sodium: 139 mmol/L (ref 135–145)

## 2019-07-09 LAB — CBC
HCT: 46.1 % (ref 39.0–52.0)
Hemoglobin: 15.3 g/dL (ref 13.0–17.0)
MCH: 31.3 pg (ref 26.0–34.0)
MCHC: 33.2 g/dL (ref 30.0–36.0)
MCV: 94.3 fL (ref 80.0–100.0)
Platelets: 170 10*3/uL (ref 150–400)
RBC: 4.89 MIL/uL (ref 4.22–5.81)
RDW: 13.2 % (ref 11.5–15.5)
WBC: 7.4 10*3/uL (ref 4.0–10.5)
nRBC: 0 % (ref 0.0–0.2)

## 2019-07-09 LAB — ECHOCARDIOGRAM COMPLETE
Height: 73 in
Weight: 4000 oz

## 2019-07-09 LAB — CBG MONITORING, ED: Glucose-Capillary: 109 mg/dL — ABNORMAL HIGH (ref 70–99)

## 2019-07-09 LAB — SARS CORONAVIRUS 2 BY RT PCR (HOSPITAL ORDER, PERFORMED IN ~~LOC~~ HOSPITAL LAB): SARS Coronavirus 2: NEGATIVE

## 2019-07-09 MED ORDER — ASPIRIN EC 81 MG PO TBEC
81.0000 mg | DELAYED_RELEASE_TABLET | Freq: Every day | ORAL | Status: DC
  Administered 2019-07-09: 81 mg via ORAL
  Filled 2019-07-09: qty 1

## 2019-07-09 MED ORDER — DOXYCYCLINE HYCLATE 100 MG PO TABS
100.0000 mg | ORAL_TABLET | Freq: Once | ORAL | Status: AC
  Administered 2019-07-09: 100 mg via ORAL
  Filled 2019-07-09: qty 1

## 2019-07-09 MED ORDER — CLOPIDOGREL BISULFATE 75 MG PO TABS: 75.0000 mg | ORAL_TABLET | Freq: Every day | ORAL | 0 refills | Status: AC

## 2019-07-09 MED ORDER — ALBUTEROL SULFATE HFA 108 (90 BASE) MCG/ACT IN AERS: 1.0000 | INHALATION_SPRAY | Freq: Four times a day (QID) | RESPIRATORY_TRACT | Status: DC | PRN

## 2019-07-09 MED ORDER — BUPROPION HCL ER (XL) 150 MG PO TB24
150.0000 mg | ORAL_TABLET | Freq: Every day | ORAL | Status: DC
  Administered 2019-07-09: 150 mg via ORAL
  Filled 2019-07-09: qty 1

## 2019-07-09 MED ORDER — ASPIRIN 81 MG PO TBEC: 81.0000 mg | DELAYED_RELEASE_TABLET | Freq: Every day | ORAL | 0 refills | Status: AC

## 2019-07-09 MED ORDER — LIDOCAINE HCL (PF) 1 % IJ SOLN
20.0000 mL | Freq: Once | INTRAMUSCULAR | Status: AC
  Administered 2019-07-09: 5 mL
  Filled 2019-07-09: qty 20

## 2019-07-09 MED ORDER — ENOXAPARIN SODIUM 40 MG/0.4ML ~~LOC~~ SOLN: 40.0000 mg | Freq: Every day | SUBCUTANEOUS | Status: DC

## 2019-07-09 MED ORDER — POVIDONE-IODINE 7.5 % EX SOLN: Freq: Once | CUTANEOUS | Status: AC

## 2019-07-09 MED ORDER — CLOPIDOGREL BISULFATE 75 MG PO TABS
75.0000 mg | ORAL_TABLET | Freq: Every day | ORAL | Status: DC
  Administered 2019-07-09: 75 mg via ORAL
  Filled 2019-07-09: qty 1

## 2019-07-09 MED ORDER — ACETAMINOPHEN 325 MG PO TABS: 650.0000 mg | ORAL_TABLET | ORAL | Status: DC | PRN

## 2019-07-09 MED ORDER — ASPIRIN 81 MG PO CHEW
324.0000 mg | CHEWABLE_TABLET | Freq: Once | ORAL | Status: AC
  Administered 2019-07-09: 324 mg via ORAL
  Filled 2019-07-09: qty 4

## 2019-07-09 MED ORDER — ACETAMINOPHEN 650 MG RE SUPP: 650.0000 mg | RECTAL | Status: DC | PRN

## 2019-07-09 MED ORDER — STROKE: EARLY STAGES OF RECOVERY BOOK: Freq: Once | Status: AC

## 2019-07-09 MED ORDER — EZETIMIBE 10 MG PO TABS
10.0000 mg | ORAL_TABLET | Freq: Every day | ORAL | 0 refills | Status: DC
Start: 2019-07-09 — End: 2019-08-25

## 2019-07-09 MED ORDER — LORATADINE 10 MG PO TABS: 10.0000 mg | ORAL_TABLET | Freq: Every day | ORAL | Status: DC

## 2019-07-09 MED ORDER — ACETAMINOPHEN 160 MG/5ML PO SOLN: 650.0000 mg | ORAL | Status: DC | PRN

## 2019-07-09 MED ORDER — IOHEXOL 350 MG/ML SOLN
75.0000 mL | Freq: Once | INTRAVENOUS | Status: AC | PRN
  Administered 2019-07-09: 75 mL via INTRAVENOUS

## 2019-07-09 MED ORDER — ATORVASTATIN CALCIUM 80 MG PO TABS: 80.0000 mg | ORAL_TABLET | Freq: Every day | ORAL | Status: DC
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
## 2019-07-09 NOTE — ED Notes (Signed)
Patient verbalizes understanding of discharge instructions. Opportunity for questioning and answers were provided. Armband removed by staff, pt discharged from ED.  

## 2019-07-09 NOTE — Discharge Instructions (Signed)
Dear Harold Colon,   Thank you so much for allowing [place] to be part of your care!  You were admitted to [facility] for a brief episode of change in speech and walking, we believe this was likely a TIA (transient ischemic attack) that can resemble a stroke without any lasting symptoms.  It is very important moving forward that you work towards glucose and cholesterol control.  We encourage you to discontinue any use of cocaine, marijuana, or amphetamines in the future.  We have discussed a few medicines that might benefit you in the future with your primary care provider, please make sure you follow-up with him.  POST-HOSPITAL & CARE INSTRUCTIONS 1. Please let PCP/Specialists know of any changes that were made.  2. Please see medications section of this packet for any medication changes.  3. You are to continue aspirin and Plavix together for 3 weeks, and then aspirin alone afterwards.  DOCTOR'S APPOINTMENT & FOLLOW UP CARE INSTRUCTIONS  Future Appointments  Date Time Provider Department Center  07/17/2019  1:55 PM Dana Allan, MD [facility]    RETURN PRECAUTIONS: Please return to the ED if you have any recurrence of symptoms.  Take care and be well!  Family Medicine Teaching Service Inpatient Team [place]  [facility]  [address] [place], [place] [address] [phone]

## 2019-07-09 NOTE — Evaluation (Signed)
Physical Therapy Evaluation and Discharge Patient Details Name: Harold Colon MRN: [id number] DOB: 03-Mar-1955 Today's Date: 07/09/2019   History of Present Illness  Pt is a 64 y/o male admitted secondary to slurred speech and difficulty ambulating. MRI negative. PMH includes substance abuse.   Clinical Impression  Patient evaluated by Physical Therapy with no further acute PT needs identified. All education has been completed and the patient has no further questions. Pt overall steady with no LOB noted. Able to perform DGI tasks without LOB. Educated about "BE FAST" acronym in recognizing CVA symptoms.  See below for any follow-up Physical Therapy or equipment needs. PT is signing off. Thank you for this referral. If needs change, please re-consult.      Follow Up Recommendations No PT follow up    Equipment Recommendations  None recommended by PT    Recommendations for Other Services       Precautions / Restrictions Precautions Precautions: None Restrictions Weight Bearing Restrictions: No      Mobility  Bed Mobility Overal bed mobility: Independent                Transfers Overall transfer level: Independent                  Ambulation/Gait Ambulation/Gait assistance: Modified independent (Device/Increase time) Gait Distance (Feet): 120 Feet Assistive device: None Gait Pattern/deviations: WFL(Within Functional Limits) Gait velocity: Decreased   General Gait Details: Slower gait speed, but could speed up with cues. Able to perform DGI tasks without LOB.   Stairs            Wheelchair Mobility    Modified Rankin (Stroke Patients Only)       Balance Overall balance assessment: Modified Independent                               Standardized Balance Assessment Standardized Balance Assessment : Dynamic Gait Index   Dynamic Gait Index Level Surface: Normal Change in Gait Speed: Normal Gait with Horizontal Head Turns:  Normal Gait with Vertical Head Turns: Normal Gait and Pivot Turn: Normal Step Over Obstacle: Normal Step Around Obstacles: Normal       Pertinent Vitals/Pain Pain Assessment: No/denies pain    Home Living Family/patient expects to be discharged to:: Private residence Living Arrangements: Non-relatives/Friends (roommate) Available Help at Discharge: Friend(s) Type of Home: Apartment Home Access: Level entry     Home Layout: One level Home Equipment: Environmental consultant - 2 wheels      Prior Function Level of Independence: Independent               Hand Dominance   Dominant Hand: Right    Extremity/Trunk Assessment   Upper Extremity Assessment Upper Extremity Assessment: Overall WFL for tasks assessed    Lower Extremity Assessment Lower Extremity Assessment: Overall WFL for tasks assessed    Cervical / Trunk Assessment Cervical / Trunk Assessment: Normal  Communication   Communication: No difficulties  Cognition Arousal/Alertness: Awake/alert Behavior During Therapy: WFL for tasks assessed/performed Overall Cognitive Status: Within Functional Limits for tasks assessed                                        General Comments General comments (skin integrity, edema, etc.): Educated about "BE FAST" symptoms in recognizing CVA symptoms.     Exercises  Assessment/Plan    PT Assessment Patent does not need any further PT services  PT Problem List         PT Treatment Interventions      PT Goals (Current goals can be found in the Care Plan section)  Acute Rehab PT Goals Patient Stated Goal: to go home PT Goal Formulation: All assessment and education complete, DC therapy Time For Goal Achievement: 07/09/19 Potential to Achieve Goals: Good    Frequency     Barriers to discharge        Co-evaluation               AM-PAC PT "6 Clicks" Mobility  Outcome Measure Help needed turning from your back to your side while in a flat bed  without using bedrails?: None Help needed moving from lying on your back to sitting on the side of a flat bed without using bedrails?: None Help needed moving to and from a bed to a chair (including a wheelchair)?: None Help needed standing up from a chair using your arms (e.g., wheelchair or bedside chair)?: None Help needed to walk in hospital room?: None Help needed climbing 3-5 steps with a railing? : A Little 6 Click Score: 23    End of Session Equipment Utilized During Treatment: Gait belt Activity Tolerance: Patient tolerated treatment well Patient left: in bed;with call bell/phone within reach (on stretcher in ED ) Nurse Communication: Mobility status PT Visit Diagnosis: Other abnormalities of gait and mobility (R26.89)    Time: 2841-3244 PT Time Calculation (min) (ACUTE ONLY): 16 min   Charges:   PT Evaluation $PT Eval Low Complexity: 1 Low          Cindee Salt, DPT  Acute Rehabilitation Services  Pager: [phone] Office: [phone]   Lehman Prom 07/09/2019, 3:25 PM

## 2019-07-09 NOTE — ED Notes (Signed)
Followed up with admitting provider about the status of discharge.
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
## 2019-07-09 NOTE — ED Notes (Signed)
Patient transported to MRI 
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
## 2019-07-09 NOTE — ED Notes (Addendum)
Spoke with Dr Annia Friendly about pt d/c.

## 2019-07-09 NOTE — Progress Notes (Signed)
STROKE TEAM PROGRESS NOTE   INTERVAL HISTORY No family at bedside. Pt lying in bed, at baseline. He admitted substance abuse and willing to quit. Stroke work up largely negative.  Vitals:   07/09/19 0530 07/09/19 0545 07/09/19 0756 07/09/19 1010  BP: (!) 120/96 100/66 111/83 109/87  Pulse: 65 65 66 69  Resp: (!) 22 (!) 25 18 16   Temp:   97.6 F (36.4 C)   TempSrc:   Oral   SpO2: 100% 99% 97% 98%  Weight:      Height:        CBC:  Recent Labs  Lab 07/08/19 1620 07/08/19 1620 07/08/19 1643 07/09/19 0354  WBC 9.1  --   --  7.4  NEUTROABS 4.7  --   --   --   HGB 16.5   < > 16.3 15.3  HCT 48.7   < > 48.0 46.1  MCV 94.2  --   --  94.3  PLT 216  --   --  170   < > = values in this interval not displayed.    Basic Metabolic Panel:  Recent Labs  Lab 07/08/19 1620 07/08/19 1620 07/08/19 1643 07/09/19 0354  NA 140   < > 144 139  K 3.5   < > 3.5 3.5  CL 107   < > 105 106  CO2 24  --   --  27  GLUCOSE 108*   < > 102* 108*  BUN 12   < > 14 13  CREATININE 1.01   < > 0.90 0.89  CALCIUM 9.8  --   --  9.4   < > = values in this interval not displayed.   Lipid Panel:     Component Value Date/Time   CHOL 193 07/09/2019 0354   CHOL 193 07/23/2018 1228   TRIG 115 07/09/2019 0354   HDL 47 07/09/2019 0354   HDL 47 07/23/2018 1228   CHOLHDL 4.1 07/09/2019 0354   VLDL 23 07/09/2019 0354   LDLCALC 123 (H) 07/09/2019 0354   LDLCALC 120 (H) 07/23/2018 1228   HgbA1c:  Lab Results  Component Value Date   HGBA1C 6.4 (H) 07/09/2019   Urine Drug Screen:     Component Value Date/Time   LABOPIA NONE DETECTED 07/09/2019 0112   COCAINSCRNUR POSITIVE (A) 07/09/2019 0112   LABBENZ NONE DETECTED 07/09/2019 0112   AMPHETMU POSITIVE (A) 07/09/2019 0112   THCU POSITIVE (A) 07/09/2019 0112   LABBARB NONE DETECTED 07/09/2019 0112    Alcohol Level     Component Value Date/Time   ETH 41 (H) 06/23/2010 0032    IMAGING past 24 hours CT ANGIO HEAD W OR WO CONTRAST  Result Date:  07/09/2019 CLINICAL DATA:  Acute presentation with slurred speech which has resolved. EXAM: CT ANGIOGRAPHY HEAD AND NECK TECHNIQUE: Multidetector CT imaging of the head and neck was performed using the standard protocol during bolus administration of intravenous contrast. Multiplanar CT image reconstructions and MIPs were obtained to evaluate the vascular anatomy. Carotid stenosis measurements (when applicable) are obtained utilizing NASCET criteria, using the distal internal carotid diameter as the denominator. CONTRAST:  9mL OMNIPAQUE IOHEXOL 350 MG/ML SOLN COMPARISON:  Head CT yesterday.  MRI earlier same day. FINDINGS: CTA NECK FINDINGS Aortic arch: Aortic atherosclerosis. No aneurysm or dissection visible. Branching pattern is normal without origin stenosis. Right carotid system: Common carotid artery widely patent to the bifurcation. Carotid bifurcation is normal without soft or calcified plaque. Cervical ICA is normal. Left carotid system: Common  carotid artery widely patent to the bifurcation. Carotid bifurcation is normal without soft or calcified plaque. Cervical ICA is normal. Vertebral arteries: Dominant left vertebral artery origin is widely patent. Non dominant right vertebral artery origin shows 50% stenosis. Beyond that, both vertebral arteries widely patent through the cervical region to the foramen magnum. Skeleton: Ordinary cervical spondylosis. Other neck: No mass or lymphadenopathy. Upper chest: Emphysema.  No active process. Review of the MIP images confirms the above findings CTA HEAD FINDINGS Anterior circulation: Both internal carotid arteries widely patent through the skull base. Atherosclerotic calcification in both carotid siphon regions but without stenosis greater than 30%. The anterior and middle cerebral vessels are patent without proximal stenosis, aneurysm or vascular malformation. No large or medium vessel occlusion. Posterior circulation: Both vertebral arteries widely patent to  the basilar. No basilar stenosis. Posterior circulation branch vessels are normal. Venous sinuses: Patent and normal. Anatomic variants: None significant. Review of the MIP images confirms the above findings IMPRESSION: No acute large or medium vessel occlusion. Aortic Atherosclerosis (ICD10-I70.0) and Emphysema (ICD10-J43.9). No atherosclerotic change at either carotid bifurcation. 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 Dominant left vertebral artery widely patent. Atherosclerotic calcification in both carotid siphon regions but without stenosis greater than 30%. Electronically Signed   By: Paulina Fusi M.D.   On: 07/09/2019 08:09   CT HEAD WO CONTRAST  Result Date: 07/08/2019 CLINICAL DATA:  Acute neuro deficit.  Dizziness and slurred speech. EXAM: CT HEAD WITHOUT CONTRAST TECHNIQUE: Contiguous axial images were obtained from the base of the skull through the vertex without intravenous contrast. COMPARISON:  None. FINDINGS: Brain: No evidence of acute infarction, hemorrhage, hydrocephalus, extra-axial collection or mass lesion/mass effect. Vascular: Arteries are diffusely higher density than usual which appears normal for this patient. No asymmetry or acute vascular thrombosis is suspected. Skull: Negative Sinuses/Orbits: Mild mucosal edema paranasal sinuses. Negative orbit. Other: None IMPRESSION: Negative CT head Sinus mucosal disease. Electronically Signed   By: Marlan Palau M.D.   On: 07/08/2019 17:28   CT ANGIO NECK W OR WO CONTRAST  Result Date: 07/09/2019 CLINICAL DATA:  Acute presentation with slurred speech which has resolved. EXAM: CT ANGIOGRAPHY HEAD AND NECK TECHNIQUE: Multidetector CT imaging of the head and neck was performed using the standard protocol during bolus administration of intravenous contrast. Multiplanar CT image reconstructions and MIPs were obtained to evaluate the vascular anatomy. Carotid stenosis measurements (when applicable) are obtained utilizing NASCET  criteria, using the distal internal carotid diameter as the denominator. CONTRAST:  57mL OMNIPAQUE IOHEXOL 350 MG/ML SOLN COMPARISON:  Head CT yesterday.  MRI earlier same day. FINDINGS: CTA NECK FINDINGS Aortic arch: Aortic atherosclerosis. No aneurysm or dissection visible. Branching pattern is normal without origin stenosis. Right carotid system: Common carotid artery widely patent to the bifurcation. Carotid bifurcation is normal without soft or calcified plaque. Cervical ICA is normal. Left carotid system: Common carotid artery widely patent to the bifurcation. Carotid bifurcation is normal without soft or calcified plaque. Cervical ICA is normal. Vertebral arteries: Dominant left vertebral artery origin is widely patent. Non dominant right vertebral artery origin shows 50% stenosis. Beyond that, both vertebral arteries widely patent through the cervical region to the foramen magnum. Skeleton: Ordinary cervical spondylosis. Other neck: No mass or lymphadenopathy. Upper chest: Emphysema.  No active process. Review of the MIP images confirms the above findings CTA HEAD FINDINGS Anterior circulation: Both internal carotid arteries widely patent through the skull base. Atherosclerotic calcification in both carotid siphon regions but without  stenosis greater than 30%. The anterior and middle cerebral vessels are patent without proximal stenosis, aneurysm or vascular malformation. No large or medium vessel occlusion. Posterior circulation: Both vertebral arteries widely patent to the basilar. No basilar stenosis. Posterior circulation branch vessels are normal. Venous sinuses: Patent and normal. Anatomic variants: None significant. Review of the MIP images confirms the above findings IMPRESSION: No acute large or medium vessel occlusion. Aortic Atherosclerosis (ICD10-I70.0) and Emphysema (ICD10-J43.9). No atherosclerotic change at either carotid bifurcation. 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 Dominant  left vertebral artery widely patent. Atherosclerotic calcification in both carotid siphon regions but without stenosis greater than 30%. Electronically Signed   By: Paulina Fusi M.D.   On: 07/09/2019 08:09   MR BRAIN WO CONTRAST  Result Date: 07/09/2019 CLINICAL DATA:  Initial evaluation for acute dizziness, slurred speech. EXAM: MRI HEAD WITHOUT CONTRAST TECHNIQUE: Multiplanar, multiecho pulse sequences of the brain and surrounding structures were obtained without intravenous contrast. COMPARISON:  Prior head CT from 07/08/2019. FINDINGS: Brain: Cerebral volume within normal limits for patient age. Minimal scattered T2/FLAIR hyperintensities noted involving the supratentorial cerebral white matter, nonspecific, but most commonly related to chronic microvascular ischemic disease, appearance fairly typical and mild for age. No abnormal foci of restricted diffusion to suggest acute or subacute ischemia. Gray-white matter differentiation well maintained. No encephalomalacia to suggest chronic infarction. No foci of susceptibility artifact to suggest acute or chronic intracranial hemorrhage. No mass lesion, midline shift or mass effect. No hydrocephalus. Probable small arachnoid cyst noted at the anterior left middle cranial fossa without associated mass effect. No other extra-axial fluid collection. Major dural sinuses are grossly patent. Pituitary gland and suprasellar region are normal. Midline structures intact and normal. Vascular: Major intracranial vascular flow voids well maintained and normal in appearance. Skull and upper cervical spine: Craniocervical junction normal. Visualized upper cervical spine within normal limits. Bone marrow signal intensity normal. No scalp soft tissue abnormality. Sinuses/Orbits: Globes and orbital soft tissues within normal limits. Scattered mucosal thickening noted within the frontoethmoidal sinuses, sphenoid sinuses, and left maxillary sinus. No air-fluid levels to suggest  acute sinusitis. No mastoid effusion. Inner ear structures grossly normal. Other: None. IMPRESSION: Normal brain MRI for age. No acute intracranial abnormality identified. Electronically Signed   By: Rise Mu M.D.   On: 07/09/2019 02:28    PHYSICAL EXAM  Temp:  [97.6 F (36.4 C)-99.1 F (37.3 C)] 97.6 F (36.4 C) (07/01 0756) Pulse Rate:  [62-103] 69 (07/01 1010) Resp:  [16-32] 16 (07/01 1010) BP: (100-125)/(66-96) 109/87 (07/01 1010) SpO2:  [96 %-100 %] 98 % (07/01 1010) Weight:  [113.4 kg] 113.4 kg (06/30 1603)  General - Well nourished, well developed, in no apparent distress.  Ophthalmologic - fundi not visualized due to noncooperation.  Cardiovascular - Regular rhythm and rate.  Mental Status -  Level of arousal and orientation to time, place, and person were intact. Language including expression, naming, repetition, comprehension was assessed and found intact. Fund of Knowledge was assessed and was intact.  Cranial Nerves II - XII - II - Visual field intact OU. III, IV, VI - Extraocular movements intact. V - Facial sensation intact bilaterally. VII - Facial movement intact bilaterally. VIII - Hearing & vestibular intact bilaterally. X - Palate elevates symmetrically. XI - Chin turning & shoulder shrug intact bilaterally. XII - Tongue protrusion intact.  Motor Strength - The patient's strength was normal in all extremities and pronator drift was absent.  Bulk was normal and fasciculations were absent.  Motor Tone - Muscle tone was assessed at the neck and appendages and was normal.  Reflexes - The patient's reflexes were symmetrical in all extremities and he had no pathological reflexes.  Sensory - Light touch, temperature/pinprick were assessed and were symmetrical.    Coordination - The patient had normal movements in the hands and feet with no ataxia or dysmetria.  Tremor was absent.  Gait and Station - deferred.   ASSESSMENT/PLAN Mr. Fulton MoleMichael A  Krah is a 64 y.o. male with history of sleep apnea, obesity, hyperlipidemia, tobacco abuse and cocaine abuse and recent abuse of "ice", presenting with 20-minute episode of difficulty walking and slurred speech.   TIA likely small vessel disease in setting of drug use    CT head No acute abnormality.   MRI  Unremarkable   CTA head & neck no LVO. Aortic atherosclerosis. Emphysema. R nondominant VA origin 50% stenosis. B ICA siphon atherosclerosis < 30% stenosis.  2D Echo EF 60-65%  LDL 123  HgbA1c 6.4  Lovenox 40 mg sq daily for VTE prophylaxis  No antithrombotic prior to admission, now on aspirin 81 mg daily and clopidogrel 75 mg daily. Continue DAPT for 3 weeks and then ASA alone  Therapy recommendations:  No therapy needs  Disposition:  Return home  Hyperlipidemia  Home meds:  lipitor 80, resumed in hospital  LDL 123, goal < 70  Add zetia 10  Continue statin and zetia at discharge  Substance abuse  UDS + for cocaine, amphetamine, THC  Pt admitted to use   Cessation education provided  He is willing to quit  Tobacco abuse  Current smoker  Smoking cessation counseling provided  Pt is willing to quit  Other Stroke Risk Factors  ETOH use, advised to drink no more than 2 drink(s) a day  Obesity, Body mass index is 32.98 kg/m., recommend weight loss, diet and exercise as appropriate   Obstructive sleep apnea  Other Active Problems  Paronychia R ring finger s/p I&D, on doxy x 5 days  Depression/anxiety on wellbutrin  Chronic LBP  GERD   Hospital day # 0  Neurology will sign off. Please call with questions. [facility]. Thanks for the consult.  Marvel PlanJindong Valentine Kuechle, MD PhD Stroke Neurology 07/09/2019 2:48 PM  To contact Stroke Continuity provider, please refer to [email or web address]. After hours, contact General Neurology

## 2019-07-09 NOTE — Discharge Summary (Addendum)
[facility] Discharge Summary  Patient name: Harold Colon Medical record number: [id number] Date of birth: 1955-05-22 Age: 64 y.o. Gender: male Date of Admission: 07/08/2019  Date of Discharge: 07/09/2019 Admitting Physician: Moses Manners, MD  Primary Care Provider: Sandre Kitty, MD Consultants: Neurology  Indication for Hospitalization: New onset of slurred speech and difficulty ambulating  Discharge Diagnoses/Problem List:  TIA Paronychia Hyperlipidemia Depression/anxiety Low back pain History of GERD History of alcohol use disorder and substance abuse History of GI bleed  Disposition: Home  Discharge Condition: Stable  Discharge Exam:  General: Patient is very pleasant and in no acute distress Cardiovascular: regular rate and rhythm, no murmurs or gallops noted Respiratory: clear to auscultation bilaterally, no wheezing or rhonchi noted Abdomen: soft, nontender upon palpation, bowel sounds present Neurology: CN 2-12 grossly intact, sensation and motor grossly intact, no abnormalities noted on cerebellar testing (finger to nose testing)  Extremities: distal pulses intact, no bilateral lower extremity edema noted Skin: warm and dry to touch  Brief Hospital Course:  TIA  Patient presented with new onset slurred speech and difficulty ambulating which resolved 20 minutes later. In the emergency department initial labsshowednormal CMP, normal CBC,and CT head negative for acute intracranial abnormality. MRI head was negative demonstrating no acute intracranial abnormalities. Neurology was consulted and recommended stroke work-up including echo, CTA head and neck both which were unrevealing. Urine drug screen positive for amphetamines, cocaine, THC. He remained hemodynamically stable for the duration of his stay without any remaining neurological deficits.  Patient advised to continue home Lipitor of 80 mg for hyperlipidemia management, LDL  123 on admit.  A1c 6.4.  CIWA was conducted for patient's history of substance and alcohol abuse. Recommended cessation of cocaine, THC and amphetamine use.  He will continue DAPT for 3 weeks, ASA 81 mg alone thereafter.  Paronychias/p I&D Noted in the emergency department with patient having swelling in his right ring finger for the past several days. Patient denied previous injury to the area. I&D via emergency physician. Patient started on doxycycline. White blood cell count within normal limits, patient without fevers.   Issues for Follow Up:  1.  Ensure follow-up with [facility] for stroke clinic in approximately 4 weeks.  DAPT x 3 weeks, then ASA 81 alone.  2.  Continue to encourage adherence to home Lipitor, reports recent doubling of Lipitor in 04/2019 with reported adherence, however LDL is unchanged.  Added Zetia on discharge for LDL goal <70.  3.  A1c 6.4, consider starting Metformin for prediabetic control.  Counseled on well-balanced diet and exercise. 4.  Continue discussion on complete polysubstance abuse cessation. 5.  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   Significant Procedures:  07/09/2019: Incision and drainage of right ring finger in the ED  Significant Labs and Imaging:  Recent Labs  Lab 07/08/19 1620 07/08/19 1643 07/09/19 0354  WBC 9.1  --  7.4  HGB 16.5 16.3 15.3  HCT 48.7 48.0 46.1  PLT 216  --  170   Recent Labs  Lab 07/08/19 1620 07/08/19 1620 07/08/19 1643 07/09/19 0354  NA 140  --  144 139  K 3.5   < > 3.5 3.5  CL 107  --  105 106  CO2 24  --   --  27  GLUCOSE 108*  --  102* 108*  BUN 12  --  14 13  CREATININE 1.01  --  0.90 0.89  CALCIUM 9.8  --   --  9.4  ALKPHOS 64  --   --   --   AST 22  --   --   --   ALT 24  --   --   --   ALBUMIN 4.1  --   --   --    < > = values in this interval not displayed.   CT ANGIO HEAD W OR WO CONTRAST  Result Date:  07/09/2019 CLINICAL DATA:  Acute presentation with slurred speech which has resolved. EXAM: CT ANGIOGRAPHY HEAD AND NECK TECHNIQUE: Multidetector CT imaging of the head and neck was performed using the standard protocol during bolus administration of intravenous contrast. Multiplanar CT image reconstructions and MIPs were obtained to evaluate the vascular anatomy. Carotid stenosis measurements (when applicable) are obtained utilizing NASCET criteria, using the distal internal carotid diameter as the denominator. CONTRAST:  21mL OMNIPAQUE IOHEXOL 350 MG/ML SOLN COMPARISON:  Head CT yesterday.  MRI earlier same day. FINDINGS: CTA NECK FINDINGS Aortic arch: Aortic atherosclerosis. No aneurysm or dissection visible. Branching pattern is normal without origin stenosis. Right carotid system: Common carotid artery widely patent to the bifurcation. Carotid bifurcation is normal without soft or calcified plaque. Cervical ICA is normal. Left carotid system: Common carotid artery widely patent to the bifurcation. Carotid bifurcation is normal without soft or calcified plaque. Cervical ICA is normal. Vertebral arteries: Dominant left vertebral artery origin is widely patent. Non dominant right vertebral artery origin shows 50% stenosis. Beyond that, both vertebral arteries widely patent through the cervical region to the foramen magnum. Skeleton: Ordinary cervical spondylosis. Other neck: No mass or lymphadenopathy. Upper chest: Emphysema.  No active process. Review of the MIP images confirms the above findings CTA HEAD FINDINGS Anterior circulation: Both internal carotid arteries widely patent through the skull base. Atherosclerotic calcification in both carotid siphon regions but without stenosis greater than 30%. The anterior and middle cerebral vessels are patent without proximal stenosis, aneurysm or vascular malformation. No large or medium vessel occlusion. Posterior circulation: Both vertebral arteries widely patent to  the basilar. No basilar stenosis. Posterior circulation branch vessels are normal. Venous sinuses: Patent and normal. Anatomic variants: None significant. Review of the MIP images confirms the above findings IMPRESSION: No acute large or medium vessel occlusion. Aortic Atherosclerosis (ICD10-I70.0) and Emphysema (ICD10-J43.9). No atherosclerotic change at either carotid bifurcation. 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 Dominant left vertebral artery widely patent. Atherosclerotic calcification in both carotid siphon regions but without stenosis greater than 30%. Electronically Signed   By: Paulina Fusi M.D.   On: 07/09/2019 08:09   CT HEAD WO CONTRAST  Result Date: 07/08/2019 CLINICAL DATA:  Acute neuro deficit.  Dizziness and slurred speech. EXAM: CT HEAD WITHOUT CONTRAST TECHNIQUE: Contiguous axial images were obtained from the base of the skull through the vertex without intravenous contrast. COMPARISON:  None. FINDINGS: Brain: No evidence of acute infarction, hemorrhage, hydrocephalus, extra-axial collection or mass lesion/mass effect. Vascular: Arteries are diffusely higher density than usual which appears normal for this patient. No asymmetry or acute vascular thrombosis is suspected. Skull: Negative Sinuses/Orbits: Mild mucosal edema paranasal sinuses. Negative orbit. Other: None IMPRESSION: Negative CT head Sinus mucosal disease. Electronically Signed   By: Marlan Palau M.D.   On: 07/08/2019 17:28   CT ANGIO NECK W OR WO CONTRAST  Result Date: 07/09/2019 CLINICAL DATA:  Acute presentation with slurred speech which has resolved. EXAM: CT ANGIOGRAPHY HEAD AND NECK TECHNIQUE: Multidetector CT imaging of the  head and neck was performed using the standard protocol during bolus administration of intravenous contrast. Multiplanar CT image reconstructions and MIPs were obtained to evaluate the vascular anatomy. Carotid stenosis measurements (when applicable) are obtained utilizing NASCET  criteria, using the distal internal carotid diameter as the denominator. CONTRAST:  75mL OMNIPAQUE IOHEXOL 350 MG/ML SOLN COMPARISON:  Head CT yesterday.  MRI earlier same day. FINDINGS: CTA NECK FINDINGS Aortic arch: Aortic atherosclerosis. No aneurysm or dissection visible. Branching pattern is normal without origin stenosis. Right carotid system: Common carotid artery widely patent to the bifurcation. Carotid bifurcation is normal without soft or calcified plaque. Cervical ICA is normal. Left carotid system: Common carotid artery widely patent to the bifurcation. Carotid bifurcation is normal without soft or calcified plaque. Cervical ICA is normal. Vertebral arteries: Dominant left vertebral artery origin is widely patent. Non dominant right vertebral artery origin shows 50% stenosis. Beyond that, both vertebral arteries widely patent through the cervical region to the foramen magnum. Skeleton: Ordinary cervical spondylosis. Other neck: No mass or lymphadenopathy. Upper chest: Emphysema.  No active process. Review of the MIP images confirms the above findings CTA HEAD FINDINGS Anterior circulation: Both internal carotid arteries widely patent through the skull base. Atherosclerotic calcification in both carotid siphon regions but without stenosis greater than 30%. The anterior and middle cerebral vessels are patent without proximal stenosis, aneurysm or vascular malformation. No large or medium vessel occlusion. Posterior circulation: Both vertebral arteries widely patent to the basilar. No basilar stenosis. Posterior circulation branch vessels are normal. Venous sinuses: Patent and normal. Anatomic variants: None significant. Review of the MIP images confirms the above findings IMPRESSION: No acute large or medium vessel occlusion. Aortic Atherosclerosis (ICD10-I70.0) and Emphysema (ICD10-J43.9). No atherosclerotic change at either carotid bifurcation. 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 Dominant  left vertebral artery widely patent. Atherosclerotic calcification in both carotid siphon regions but without stenosis greater than 30%. Electronically Signed   By: Paulina Fusi M.D.   On: 07/09/2019 08:09   MR BRAIN WO CONTRAST  Result Date: 07/09/2019 CLINICAL DATA:  Initial evaluation for acute dizziness, slurred speech. EXAM: MRI HEAD WITHOUT CONTRAST TECHNIQUE: Multiplanar, multiecho pulse sequences of the brain and surrounding structures were obtained without intravenous contrast. COMPARISON:  Prior head CT from 07/08/2019. FINDINGS: Brain: Cerebral volume within normal limits for patient age. Minimal scattered T2/FLAIR hyperintensities noted involving the supratentorial cerebral white matter, nonspecific, but most commonly related to chronic microvascular ischemic disease, appearance fairly typical and mild for age. No abnormal foci of restricted diffusion to suggest acute or subacute ischemia. Gray-white matter differentiation well maintained. No encephalomalacia to suggest chronic infarction. No foci of susceptibility artifact to suggest acute or chronic intracranial hemorrhage. No mass lesion, midline shift or mass effect. No hydrocephalus. Probable small arachnoid cyst noted at the anterior left middle cranial fossa without associated mass effect. No other extra-axial fluid collection. Major dural sinuses are grossly patent. Pituitary gland and suprasellar region are normal. Midline structures intact and normal. Vascular: Major intracranial vascular flow voids well maintained and normal in appearance. Skull and upper cervical spine: Craniocervical junction normal. Visualized upper cervical spine within normal limits. Bone marrow signal intensity normal. No scalp soft tissue abnormality. Sinuses/Orbits: Globes and orbital soft tissues within normal limits. Scattered mucosal thickening noted within the frontoethmoidal sinuses, sphenoid sinuses, and left maxillary sinus. No air-fluid levels to suggest  acute sinusitis. No mastoid effusion. Inner ear structures grossly normal. Other: None. IMPRESSION: Normal brain MRI for age. No acute intracranial  abnormality identified. Electronically Signed   By: Rise MuBenjamin  McClintock M.D.   On: 07/09/2019 02:28   ECHOCARDIOGRAM COMPLETE  Result Date: 07/09/2019    ECHOCARDIOGRAM REPORT   Patient Name:   Fulton MoleMICHAEL A Tomasso Date of Exam: 07/09/2019 Medical Rec #:  [id number]       Height:       73.0 in Accession #:    [id number]      Weight:       250.0 lb Date of Birth:  05-Feb-1955      BSA:          2.366 m Patient Age:    63 years        BP:           111/83 mmHg Patient Gender: M               HR:           75 bpm. Exam Location:  Inpatient Procedure: 2D Echo, Cardiac Doppler and Color Doppler Indications:    TIA 435.9 / G45.9  History:        Patient has no prior history of Echocardiogram examinations.                 Risk Factors:Dyslipidemia and Current Smoker.  Sonographer:    Renella CunasJulia Swaim RDCS Referring Phys: Lyndel [id number] Chrissie NoaWILLIAM A HENSEL IMPRESSIONS  1. Left ventricular ejection fraction, by estimation, is 60 to 65%. The left ventricle has normal function. The left ventricle has no regional wall motion abnormalities. Left ventricular diastolic parameters are consistent with Grade I diastolic dysfunction (impaired relaxation).  2. Right ventricular systolic function is normal. The right ventricular size is normal. Tricuspid regurgitation signal is inadequate for assessing PA pressure.  3. The mitral valve is normal in structure. No evidence of mitral valve regurgitation. No evidence of mitral stenosis.  4. The aortic valve is normal in structure. Aortic valve regurgitation is not visualized. No aortic stenosis is present.  5. The inferior vena cava is normal in size with greater than 50% respiratory variability, suggesting right atrial pressure of 3 mmHg. Conclusion(s)/Recommendation(s): No intracardiac source of embolism detected on this transthoracic study. A transesophageal  echocardiogram is recommended to exclude cardiac source of embolism if clinically indicated. FINDINGS  Left Ventricle: Left ventricular ejection fraction, by estimation, is 60 to 65%. The left ventricle has normal function. The left ventricle has no regional wall motion abnormalities. The left ventricular internal cavity size was normal in size. There is  no left ventricular hypertrophy. Left ventricular diastolic parameters are consistent with Grade I diastolic dysfunction (impaired relaxation). Right Ventricle: The right ventricular size is normal. No increase in right ventricular wall thickness. Right ventricular systolic function is normal. Tricuspid regurgitation signal is inadequate for assessing PA pressure. Left Atrium: Left atrial size was normal in size. Right Atrium: Right atrial size was normal in size. Pericardium: There is no evidence of pericardial effusion. Mitral Valve: The mitral valve is normal in structure. Normal mobility of the mitral valve leaflets. No evidence of mitral valve regurgitation. No evidence of mitral valve stenosis. Tricuspid Valve: The tricuspid valve is normal in structure. Tricuspid valve regurgitation is not demonstrated. No evidence of tricuspid stenosis. Aortic Valve: The aortic valve is normal in structure. Aortic valve regurgitation is not visualized. No aortic stenosis is present. Pulmonic Valve: The pulmonic valve was normal in structure. Pulmonic valve regurgitation is not visualized. No evidence of pulmonic stenosis. Aorta: The aortic root is normal in size and structure. Venous:  The inferior vena cava is normal in size with greater than 50% respiratory variability, suggesting right atrial pressure of 3 mmHg. IAS/Shunts: The interatrial septum was not well visualized.  LEFT VENTRICLE PLAX 2D LVIDd:         5.00 cm     Diastology LVIDs:         3.60 cm     LV e' lateral:   7.10 cm/s LV PW:         0.80 cm     LV E/e' lateral: 6.8 LV IVS:        0.80 cm     LV e' medial:     6.31 cm/s LVOT diam:     2.30 cm     LV E/e' medial:  7.7 LV SV:         55 LV SV Index:   23 LVOT Area:     4.15 cm  LV Volumes (MOD) LV vol d, MOD A2C: 93.3 ml LV vol d, MOD A4C: 65.9 ml LV vol s, MOD A2C: 40.7 ml LV vol s, MOD A4C: 32.8 ml LV SV MOD A2C:     52.6 ml LV SV MOD A4C:     65.9 ml LV SV MOD BP:      42.2 ml RIGHT VENTRICLE RV S prime:     8.67 cm/s TAPSE (M-mode): 2.0 cm LEFT ATRIUM           Index       RIGHT ATRIUM           Index LA diam:      2.60 cm 1.10 cm/m  RA Area:     12.90 cm LA Vol (A2C): 26.6 ml 11.24 ml/m RA Volume:   29.00 ml  12.26 ml/m LA Vol (A4C): 37.4 ml 15.81 ml/m  AORTIC VALVE LVOT Vmax:   71.30 cm/s LVOT Vmean:  46.200 cm/s LVOT VTI:    0.132 m  AORTA Ao Root diam: 3.60 cm MITRAL VALVE MV Area (PHT): 2.95 cm    SHUNTS MV Decel Time: 257 msec    Systemic VTI:  0.13 m MV E velocity: 48.30 cm/s  Systemic Diam: 2.30 cm MV A velocity: 85.60 cm/s MV E/A ratio:  0.56 Weston Brass MD Electronically signed by Weston Brass MD Signature Date/Time: 07/09/2019/1:15:36 PM    Final     Results/Tests Pending at Time of Discharge:  Unresulted Labs (From admission, onward) Comment         None      Discharge Medications:  Allergies as of 07/09/2019      Reactions   Latex Itching, Rash   NO POWDERED GLOVES!!      Medication List    STOP taking these medications   chlorhexidine 4 % external liquid Commonly known as: Hibiclens   cyclobenzaprine 10 MG tablet Commonly known as: FLEXERIL   dicyclomine 20 MG tablet Commonly known as: BENTYL   neomycin-polymyxin-hydrocortisone 3.5-10000-1 OTIC suspension Commonly known as: CORTISPORIN   ondansetron 4 MG disintegrating tablet Commonly known as: Zofran ODT   sucralfate 1 g tablet Commonly known as: Carafate   terbinafine 250 MG tablet Commonly known as: LAMISIL   triamcinolone 0.025 % ointment Commonly known as: KENALOG     TAKE these medications   albuterol 108 (90 Base) MCG/ACT inhaler Commonly  known as: VENTOLIN HFA Inhale 1-2 puffs into the lungs every 6 (six) hours as needed for wheezing or shortness of breath.   aspirin 81 MG EC tablet Take 1 tablet (  81 mg total) by mouth daily. Swallow whole.   atorvastatin 80 MG tablet Commonly known as: LIPITOR Take 1 tablet (80 mg total) by mouth daily.   B COMPLEX VITAMINS PO Take 1 tablet by mouth daily.   buPROPion 150 MG 24 hr tablet Commonly known as: WELLBUTRIN XL Take 1 tablet (150 mg total) by mouth daily.   calamine lotion Apply 1 application topically as needed for itching.   cetirizine 10 MG tablet Commonly known as: ZyrTEC Allergy Take 1 tablet (10 mg total) by mouth daily.   clopidogrel 75 MG tablet Commonly known as: PLAVIX Take 1 tablet (75 mg total) by mouth daily.   dextromethorphan-guaiFENesin 30-600 MG 12hr tablet Commonly known as: MUCINEX DM Take 1 tablet by mouth 2 (two) times daily.   diphenhydrAMINE 25 MG tablet Commonly known as: BENADRYL Take 25 mg by mouth every 6 (six) hours as needed for itching or allergies.   ezetimibe 10 MG tablet Commonly known as: Zetia Take 1 tablet (10 mg total) by mouth daily.   hydrocortisone valerate ointment 0.2 % Commonly known as: Westcort Apply 1 application topically 2 (two) times daily.   hydrOXYzine 25 MG tablet Commonly known as: ATARAX/VISTARIL Take 1 tablet (25 mg total) by mouth every 6 (six) hours. What changed:   when to take this  reasons to take this   nicotine 14 mg/24hr patch Commonly known as: NICODERM CQ - dosed in mg/24 hours Place 1 patch (14 mg total) onto the skin daily.   ZINC PO Take 1 tablet by mouth daily.     ASK your doctor about these medications   Wraparound Wrist Support Misc 1 each by Does not apply route as needed.       Discharge Instructions: Please refer to Patient Instructions section of EMR for full details.  Patient was counseled important signs and symptoms that should prompt return to medical care,  changes in medications, dietary instructions, activity restrictions, and follow up appointments.   Follow-Up Appointments:  Follow-up Information    [facility]. Schedule an appointment as soon as possible for a visit in 4 week(s).   Specialty: Neurology Contact information: [address] [place] [address] [phone]       [facility]. Go on 07/17/2019.   Why: Hosp f/u with Dr. Clent Ridges at 1:55pm, arrive 15 minutes early  Contact information: [address] [place] [address] [phone]              Reece Leader, DO  07/11/2019, 2:56 PM PGY-1, [facility]  FPTS Upper-Level Resident Addendum   My edits for correction/addition/clarification are added. Please see also any attending notes.    Allayne Stack, DO  Family Medicine PGY-2

## 2019-07-09 NOTE — Progress Notes (Signed)
FPTS Interim Progress Note  S: Endorses feeling better.   O: BP 111/83 (BP Location: Left Arm)   Pulse 66   Temp 97.6 F (36.4 C) (Oral)   Resp 18   Ht 6\' 1"  (1.854 m)   Wt 113.4 kg   SpO2 97%   BMI 32.98 kg/m    General: Appears tired, no acute distress. Age appropriate. MSK: 5/5 BUE + BLE Neuro: alert and oriented, no focal deficits. CN II-XII grossly intact.   A/P: High suspicion for TIA given negative imaging and patient's return to baseline.  Appreciate H&P for continued plan.   , DO 07/09/2019, 9:11 AM PGY-2, [facility] Family Medicine Service pager [phone]

## 2019-07-09 NOTE — ED Notes (Signed)
CT notified pt ready for transport

## 2019-07-09 NOTE — Progress Notes (Signed)
  Echocardiogram 2D Echocardiogram has been performed.  Harold Colon 07/09/2019, 9:43 AM

## 2019-07-09 NOTE — ED Notes (Signed)
Reviewed follow up appointments and instructions with pt. Educated pt on signs of stroke and to call 911 if he has any symptoms.

## 2019-07-09 NOTE — ED Notes (Signed)
Contacted admitting provider to inquire about possible discharge today.

## 2019-07-09 NOTE — ED Notes (Signed)
OT at bedside. 

## 2019-07-09 NOTE — ED Notes (Signed)
Contacted patient placement to let them know the patient will be discharged.

## 2019-07-09 NOTE — Evaluation (Signed)
Occupational Therapy Evaluation and Discharge Patient Details Name: Harold Colon MRN: [id number] DOB: Jun 12, 1955 Today's Date: 07/09/2019    History of Present Illness Pt is a 64 y/o male admitted secondary to slurred speech and difficulty ambulating. MRI negative. PMH includes substance abuse.    Clinical Impression   Pt demonstrated ability to perform ADL and ambulation to bathroom independently. Educated pt at length in s/s of stroke and importance of obtaining immediately medical attention should he experience symptoms. Pt verbalized understanding. No further OT needs.    Follow Up Recommendations  No OT follow up    Equipment Recommendations  None recommended by OT    Recommendations for Other Services       Precautions / Restrictions Precautions Precautions: None Restrictions Weight Bearing Restrictions: No      Mobility Bed Mobility Overal bed mobility: Independent                Transfers Overall transfer level: Independent                    Balance Overall balance assessment: Modified Independent                               Standardized Balance Assessment Standardized Balance Assessment : Dynamic Gait Index   Dynamic Gait Index Level Surface: Normal Change in Gait Speed: Normal Gait with Horizontal Head Turns: Normal Gait with Vertical Head Turns: Normal Gait and Pivot Turn: Normal Step Over Obstacle: Normal Step Around Obstacles: Normal     ADL either performed or assessed with clinical judgement   ADL Overall ADL's : Independent                                             Vision Baseline Vision/History: No visual deficits Patient Visual Report: No change from baseline       Perception     Praxis      Pertinent Vitals/Pain Pain Assessment: No/denies pain     Hand Dominance Right   Extremity/Trunk Assessment Upper Extremity Assessment Upper Extremity Assessment: Overall WFL for  tasks assessed   Lower Extremity Assessment Lower Extremity Assessment: Defer to PT evaluation   Cervical / Trunk Assessment Cervical / Trunk Assessment: Normal   Communication Communication Communication: No difficulties   Cognition Arousal/Alertness: Awake/alert Behavior During Therapy: WFL for tasks assessed/performed Overall Cognitive Status: Within Functional Limits for tasks assessed                                     General Comments  Educated about "BE FAST" symptoms in recognizing CVA symptoms.     Exercises     Shoulder Instructions      Home Living Family/patient expects to be discharged to:: Private residence Living Arrangements: Non-relatives/Friends Available Help at Discharge: Friend(s) Type of Home: Apartment Home Access: Level entry     Home Layout: One level     Bathroom Shower/Tub: Chief Strategy Officer: Standard     Home Equipment: Environmental consultant - 2 wheels          Prior Functioning/Environment Level of Independence: Independent                 OT Problem List:  OT Treatment/Interventions:      OT Goals(Current goals can be found in the care plan section) Acute Rehab OT Goals Patient Stated Goal: to go home  OT Frequency:     Barriers to D/C:            Co-evaluation              AM-PAC OT "6 Clicks" Daily Activity     Outcome Measure Help from another person eating meals?: None Help from another person taking care of personal grooming?: None Help from another person toileting, which includes using toliet, bedpan, or urinal?: None Help from another person bathing (including washing, rinsing, drying)?: None Help from another person to put on and taking off regular upper body clothing?: None Help from another person to put on and taking off regular lower body clothing?: None 6 Click Score: 24   End of Session    Activity Tolerance: Patient tolerated treatment well Patient left: in  bed;with call bell/phone within reach;with nursing/sitter in room  OT Visit Diagnosis: Muscle weakness (generalized) (M62.81)                Time: 9622-2979 OT Time Calculation (min): 17 min Charges:  OT General Charges $OT Visit: 1 Visit OT Evaluation $OT Eval Low Complexity: 1 Low  Harold Colon, OTR/L Acute Rehabilitation Services Pager: [phone] Office: [phone]  Harold Colon 07/09/2019, 3:38 PM

## 2019-07-17 ENCOUNTER — Other Ambulatory Visit: Payer: Self-pay

## 2019-07-17 ENCOUNTER — Ambulatory Visit (INDEPENDENT_AMBULATORY_CARE_PROVIDER_SITE_OTHER): Payer: Medicaid Other | Admitting: Family Medicine

## 2019-07-17 ENCOUNTER — Encounter: Payer: Self-pay | Admitting: Family Medicine

## 2019-07-17 DIAGNOSIS — G459 Transient cerebral ischemic attack, unspecified: Secondary | ICD-10-CM

## 2019-07-17 NOTE — Progress Notes (Signed)
    SUBJECTIVE:   CHIEF COMPLAINT / HPI: follow up from ED visit  TIA Patient presented to ED on 06/30 with aphasia and TIA symptoms.  MRI/CT head negative at that time.  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  Echo showed LVEF 60-65% grade 1 diastolic dysfunction, no cardiac source for TIA.  Toxicology reports positive for amphetamines cocaine and THC.  Patient medication list started on Lipitor 80 mg, ASA 81 mg Plavix 75 mg Zetia 10 mg Wellbutrin 150 mg daily.  Patient reports speech has improved.  Denies any weakness in upper and lower extremities, no chest pain, shortness of breath, headaches or worsening visual disturbances (patient has history of glaucoma).  He is a current smoker and is requesting assistance with smoking cessation.  Patient reports that he had THC few days ago.  Reports no recent use of cocaine.  PERTINENT  PMH / PSH:  Tobacco use disorder Hyperlipidemia Depression History of GI bleed (2013) Polysubstance abuse. OBJECTIVE:   BP 124/82   Pulse 75   Wt 255 lb 9.6 oz (115.9 kg)   SpO2 96%   BMI 33.72 kg/m    General: Alert and oriented, no apparent distress  Eyes: PEERLA ENTM: No pharyengeal erythema Neck: nontender Cardiovascular: RRR with no murmurs noted Respiratory: CTA bilaterally  MSK: Upper extremity strength 5/5 bilaterally, Lower extremity strength 5/5 bilaterally  Neuro: CN III-XII intact, speech normal, motor and sensory intact, gait intact Psych: Behavior and speech appropriate to situation, no SI or HI  ASSESSMENT/PLAN:   TIA (transient ischemic attack) Resolved.  No focal deficits.  Discussed importance of smoking cessation, weight management and cessation of polysubstance use. -Continue Lipitor 80 mg and Zetia 10 mg daily -Continue dual antiplatelet therapy with Plavix 75 mg and aspirin 81 mg daily x3/52 -Discontinue Plavix after 3 weeks and continue with ASA 81 mg -Encourage patient to make an appointment with Dr. Raymondo Band for smoking  cessation. -Follow-up with PCP in 4 weeks.     Dana Allan, MD [facility]

## 2019-07-17 NOTE — Patient Instructions (Addendum)
Thank you for coming to see me today. It was a pleasure. Today we talked about:   Please schedule an appointment with Dr Raymondo Band to help with smoking cessation.    Continue Plavix and Aspirin for three weeks then you can stop the Plavix and continue on with Aspirin only.  I recommend starting an mild to moderate exercise program.  Walking 15 mins every other day is a good start.  Please follow-up with PCP in 4 weeks  If you have any questions or concerns, please do not hesitate to call the office at [phone].  Best,   Dana Allan, MD Family [facility] Res[facility]     Transient Ischemic Attack  A transient ischemic attack (TIA) is a "warning stroke" that causes stroke-like symptoms that go away quickly. A TIA does not cause lasting damage to the brain. But having a TIA is a sign that you may be at risk for a stroke. Lifestyle changes and medical treatments can help prevent a stroke. It is important to know the symptoms of a TIA and what to do. Get help right away, even if your symptoms go away. The symptoms of a TIA are the same as those of a stroke. They can happen fast, and they usually go away within minutes or hours. They can include:  Weakness or loss of feeling in your face, arm, or leg. This often happens on one side of your body.  Trouble walking.  Trouble moving your arms or legs.  Trouble talking or understanding what people are saying.  Trouble seeing.  Seeing two of one object (double vision).  Feeling dizzy.  Feeling confused.  Loss of balance or coordination.  Feeling sick to your stomach (nauseous) and throwing up (vomiting).  A very bad headache for no reason. What increases the risk? Certain things may make you more likely to have a TIA. Some of these are things that you can change, such as:  Being very overweight (obese).  Using products that contain nicotine or tobacco, such as cigarettes and e-cigarettes.  Taking birth control pills.  Not being  active.  Drinking too much alcohol.  Using drugs. Other risk factors include:  Having an irregular heartbeat (atrial fibrillation).  Being African American or Hispanic.  Having had blood clots, stroke, TIA, or heart attack in the past.  Being a woman with a history of high blood pressure in pregnancy (preeclampsia).  Being over the age of 55.  Being male.  Having family history of stroke.  Having the following diseases or conditions: ? High blood pressure. ? High cholesterol. ? Diabetes. ? Heart disease. ? Sickle cell disease. ? Sleep apnea. ? Migraine headache. ? Long-term (chronic) diseases that cause soreness and swelling (inflammation). ? Disorders that affect how your blood clots. Follow these instructions at home: Medicines   Take over-the-counter and prescription medicines only as told by your doctor.  If you were told to take aspirin or another medicine to thin your blood, take it exactly as told by your doctor. ? Taking too much of the medicine can cause bleeding. ? Taking too little of the medicine may not work to treat the problem. Eating and drinking   Eat 5 or more servings of fruits and vegetables each day.  Follow instructions from your doctor about your diet. You may need to follow a certain diet to help lower your risk of having a stroke. You may need to: ? Eat a diet that is low in fat and salt. ? Eat  foods that contain a lot of fiber. ? Limit the amount of carbohydrates and sugar in your diet.  Limit alcohol intake to 1 drink a day for nonpregnant women and 2 drinks a day for men. One drink equals 12 oz of beer, 5 oz of wine, or 1 oz of hard liquor. General instructions  Keep a healthy weight.  Stay active. Try to get at least 30 minutes of activity on all or most days.  Find out if you have a condition called sleep apnea. Get treatment if needed.  Do not use any products that contain nicotine or tobacco, such as cigarettes and  e-cigarettes. If you need help quitting, ask your doctor.  Do not abuse drugs.  Keep all follow-up visits as told by your doctor. This is important. Get help right away if:  You have any signs of stroke. "BE FAST" is an easy way to remember the main warning signs: ? B - Balance. Signs are dizziness, sudden trouble walking, or loss of balance. ? E - Eyes. Signs are trouble seeing or a sudden change in how you see. ? F - Face. Signs are sudden weakness or loss of feeling of the face, or the face or eyelid drooping on one side. ? A - Arms. Signs are weakness or loss of feeling in an arm. This happens suddenly and usually on one side of the body. ? S - Speech. Signs are sudden trouble speaking, slurred speech, or trouble understanding what people say. ? T - Time. Time to call emergency services. Write down what time symptoms started.  You have other signs of stroke, such as: ? A sudden, very bad headache with no known cause. ? Feeling sick to your stomach (nausea). ? Throwing up (vomiting). ? Jerky movements that you cannot control (seizure). These symptoms may be an emergency. Do not wait to see if the symptoms will go away. Get medical help right away. Call your local emergency services (911 in the [place] Do not drive yourself to the hospital. Summary  A transient ischemic attack (TIA) is a "warning stroke" that causes stroke-like symptoms that go away quickly.  A TIA is a medical emergency. Get help right away, even if your symptoms go away.  A TIA does not cause lasting damage to the brain.  Having a TIA is a sign that you may be at risk for a stroke. Lifestyle changes and medical treatments can help prevent a stroke. This information is not intended to replace advice given to you by your health care provider. Make sure you discuss any questions you have with your health care provider. Document Revised: 09/20/2017 Document Reviewed: 03/28/2016 Elsevier Patient Education  [employer]. Thank you for coming to see me today. It was a pleasure. Today we talked about:

## 2019-07-19 ENCOUNTER — Encounter: Payer: Self-pay | Admitting: Family Medicine

## 2019-07-19 MED ORDER — ALBUTEROL SULFATE HFA 108 (90 BASE) MCG/ACT IN AERS: 1.0000 | INHALATION_SPRAY | Freq: Four times a day (QID) | RESPIRATORY_TRACT | 0 refills | Status: DC | PRN

## 2019-07-19 NOTE — Assessment & Plan Note (Signed)
Resolved.  No focal deficits.  Discussed importance of smoking cessation, weight management and cessation of polysubstance use. -Continue Lipitor 80 mg and Zetia 10 mg daily -Continue dual antiplatelet therapy with Plavix 75 mg and aspirin 81 mg daily x3/52 -Discontinue Plavix after 3 weeks and continue with ASA 81 mg -Encourage patient to make an appointment with Dr. Raymondo Band for smoking cessation. -Follow-up with PCP in 4 weeks.

## 2019-07-27 ENCOUNTER — Ambulatory Visit: Payer: Medicaid Other | Admitting: Pharmacist

## 2019-08-12 ENCOUNTER — Inpatient Hospital Stay: Payer: Medicaid Other | Admitting: Adult Health

## 2019-08-12 ENCOUNTER — Encounter: Payer: Self-pay | Admitting: Adult Health

## 2019-08-25 ENCOUNTER — Ambulatory Visit (INDEPENDENT_AMBULATORY_CARE_PROVIDER_SITE_OTHER): Payer: Medicaid Other | Admitting: Family Medicine

## 2019-08-25 ENCOUNTER — Encounter: Payer: Self-pay | Admitting: Family Medicine

## 2019-08-25 ENCOUNTER — Other Ambulatory Visit: Payer: Self-pay

## 2019-08-25 DIAGNOSIS — F1510 Other stimulant abuse, uncomplicated: Secondary | ICD-10-CM | POA: Insufficient documentation

## 2019-08-25 DIAGNOSIS — G459 Transient cerebral ischemic attack, unspecified: Secondary | ICD-10-CM

## 2019-08-25 DIAGNOSIS — F1410 Cocaine abuse, uncomplicated: Secondary | ICD-10-CM | POA: Insufficient documentation

## 2019-08-25 HISTORY — DX: Other stimulant abuse, uncomplicated: F15.10

## 2019-08-25 MED ORDER — EZETIMIBE 10 MG PO TABS: 10.0000 mg | ORAL_TABLET | Freq: Every day | ORAL | 1 refills | Status: AC

## 2019-08-25 MED ORDER — NICOTINE POLACRILEX 4 MG MT GUM: 4.0000 mg | CHEWING_GUM | OROMUCOSAL | 0 refills | Status: DC | PRN

## 2019-08-25 NOTE — Patient Instructions (Addendum)
It was nice to see you again today,  I have sent in a prescription for Nicorette gum as well as refilling your Zetia.  Quitting smoking is the best thing you can do for your health.  Next best thing is starting an exercise program and focusing on losing weight.  Even a 20 pound weight loss will be beneficial for your health.  I will resend the referral to the neurologist.  Have a great day,  Frederic Jericho, MD

## 2019-08-25 NOTE — Progress Notes (Signed)
    SUBJECTIVE:   CHIEF COMPLAINT / HPI:   TIA: Patient is taking his aspirin, Lipitor, and Zetia as prescribed.  States he takes it daily.  He has not followed up with the stroke clinic referral.  He states he recently got a new phone number after losing his last phone and this may be why he never received a call about the referral.  He states he would like to try and start losing weight and exercising.  He is interested in starting a gym membership.  He also would like to stop smoking.  He smokes about 5 to 10 cigarettes a day currently.  He has tried patch and gum in the past.  He likes the gum, but did not think the patch helped him.  PERTINENT  PMH / PSH: TIA, tobacco use, HLD  OBJECTIVE:   BP 124/64   Pulse 75   Ht 6\' 1"  (1.854 m)   Wt 254 lb (115.2 kg)   SpO2 98%   BMI 33.51 kg/m   General: Alert and oriented, no acute distress. CV: Regular rate and rhythm, no murmurs Pulmonary: Lungs clear to auscultation bilaterally, no wheezes or crackles  ASSESSMENT/PLAN:   TIA (transient ischemic attack) Patient currently without recurrent or residual neuro deficits.  Patient is adherent to his medications.  Blood pressure good today.  Discussed with patient the benefits of weight loss, diet and exercise and encouraged him to follow through with starting an exercise program.  Prescribed Nicorette gum and gave patient information on 1 800 quit now line.  Encouraged total cessation of tobacco use.     , MD [facility]

## 2019-08-27 NOTE — Assessment & Plan Note (Signed)
Patient currently without recurrent or residual neuro deficits.  Patient is adherent to his medications.  Blood pressure good today.  Discussed with patient the benefits of weight loss, diet and exercise and encouraged him to follow through with starting an exercise program.  Prescribed Nicorette gum and gave patient information on 1 800 quit now line.  Encouraged total cessation of tobacco use.

## 2019-10-30 ENCOUNTER — Encounter (HOSPITAL_COMMUNITY): Payer: Self-pay | Admitting: Emergency Medicine

## 2019-10-30 ENCOUNTER — Ambulatory Visit (HOSPITAL_COMMUNITY)
Admission: EM | Admit: 2019-10-30 | Discharge: 2019-10-30 | Disposition: A | Discharge status: Home / Self Care | Payer: Medicaid Other | Attending: Urgent Care | Admitting: Urgent Care

## 2019-10-30 ENCOUNTER — Other Ambulatory Visit: Payer: Self-pay

## 2019-10-30 DIAGNOSIS — L739 Follicular disorder, unspecified: Secondary | ICD-10-CM | POA: Diagnosis not present

## 2019-10-30 DIAGNOSIS — R21 Rash and other nonspecific skin eruption: Secondary | ICD-10-CM

## 2019-10-30 MED ORDER — CEPHALEXIN 500 MG PO CAPS
500.0000 mg | ORAL_CAPSULE | Freq: Three times a day (TID) | ORAL | 0 refills | Status: DC
Start: 2019-10-30 — End: 2020-04-05
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
## 2019-10-30 NOTE — ED Triage Notes (Signed)
Pt presents with rash xs 1 week. States recently gotten worse and itchy.

## 2020-04-05 ENCOUNTER — Other Ambulatory Visit: Payer: Self-pay

## 2020-04-05 ENCOUNTER — Ambulatory Visit (HOSPITAL_COMMUNITY)
Admission: EM | Admit: 2020-04-05 | Discharge: 2020-04-05 | Disposition: A | Discharge status: Home / Self Care | Payer: Medicaid Other | Attending: Urgent Care | Admitting: Urgent Care

## 2020-04-05 ENCOUNTER — Encounter (HOSPITAL_COMMUNITY): Payer: Self-pay | Admitting: Emergency Medicine

## 2020-04-05 DIAGNOSIS — L42 Pityriasis rosea: Secondary | ICD-10-CM

## 2020-04-05 DIAGNOSIS — R21 Rash and other nonspecific skin eruption: Secondary | ICD-10-CM

## 2020-04-05 DIAGNOSIS — L299 Pruritus, unspecified: Secondary | ICD-10-CM

## 2020-04-05 MED ORDER — CEPHALEXIN 500 MG PO CAPS
500.0000 mg | ORAL_CAPSULE | Freq: Three times a day (TID) | ORAL | 0 refills | Status: DC
Start: 2020-04-05 — End: 2020-06-20

## 2020-04-05 MED ORDER — HYDROXYZINE HCL 25 MG PO TABS: 12.5000 mg | ORAL_TABLET | Freq: Three times a day (TID) | ORAL | 0 refills | Status: AC | PRN
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
## 2020-04-05 NOTE — ED Triage Notes (Signed)
Pt presents with rash of left side of abdomin xs 1 week.

## 2020-06-20 ENCOUNTER — Ambulatory Visit (HOSPITAL_COMMUNITY)
Admission: EM | Admit: 2020-06-20 | Discharge: 2020-06-20 | Disposition: A | Discharge status: Home / Self Care | Payer: Medicaid Other

## 2020-06-20 ENCOUNTER — Encounter (HOSPITAL_COMMUNITY): Payer: Self-pay

## 2020-06-20 ENCOUNTER — Other Ambulatory Visit: Payer: Self-pay

## 2020-06-20 DIAGNOSIS — L089 Local infection of the skin and subcutaneous tissue, unspecified: Secondary | ICD-10-CM

## 2020-06-20 DIAGNOSIS — W57XXXA Bitten or stung by nonvenomous insect and other nonvenomous arthropods, initial encounter: Secondary | ICD-10-CM | POA: Diagnosis not present

## 2020-06-20 HISTORY — DX: Cerebral infarction, unspecified: I63.9

## 2020-06-20 MED ORDER — DOXYCYCLINE HYCLATE 100 MG PO CAPS
100.0000 mg | ORAL_CAPSULE | Freq: Two times a day (BID) | ORAL | 0 refills | Status: DC
Start: 2020-06-20 — End: 2021-01-24

## 2020-06-20 NOTE — ED Triage Notes (Addendum)
Pt c/o possible bug bites to back of neck. Reports has had previous bed bug bites on other parts of body in past.   Several welts noted on back of neck and upper abdomen, one on back. Reports bumps itch and hurt. Pt has put alcohol on bumps and washed with soap and water. Has been noticing about one month.   Pt reports being out of a lot of his home medications.
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
## 2020-06-20 NOTE — Discharge Instructions (Signed)
Take doxycycline twice daily for 10 days to cover for infection.  Keep area clean with warm water and soap.  Monitor area and if this continues to spread or worsens despite use of medication you need to be reevaluated.  If your symptoms do not improve I would recommend following up with your PCP and/or dermatologist for evaluation and management.  If you develop any additional symptoms including fever, nausea, vomiting, spread of lesions you need to be reevaluated.

## 2020-08-19 ENCOUNTER — Other Ambulatory Visit: Payer: Self-pay

## 2020-08-19 ENCOUNTER — Ambulatory Visit (INDEPENDENT_AMBULATORY_CARE_PROVIDER_SITE_OTHER): Payer: Medicaid Other | Admitting: Student

## 2020-08-19 ENCOUNTER — Encounter: Payer: Self-pay | Admitting: Student

## 2020-08-19 VITALS — BP 136/80 | HR 63 | Ht 73.0 in | Wt 248.0 lb

## 2020-08-19 DIAGNOSIS — N200 Calculus of kidney: Secondary | ICD-10-CM | POA: Diagnosis not present

## 2020-08-19 DIAGNOSIS — E785 Hyperlipidemia, unspecified: Secondary | ICD-10-CM

## 2020-08-19 DIAGNOSIS — R109 Unspecified abdominal pain: Secondary | ICD-10-CM | POA: Diagnosis not present

## 2020-08-19 DIAGNOSIS — R300 Dysuria: Secondary | ICD-10-CM | POA: Diagnosis present

## 2020-08-19 DIAGNOSIS — J302 Other seasonal allergic rhinitis: Secondary | ICD-10-CM | POA: Diagnosis not present

## 2020-08-19 LAB — POCT URINALYSIS DIP (MANUAL ENTRY)
Bilirubin, UA: NEGATIVE
Blood, UA: NEGATIVE
Glucose, UA: NEGATIVE mg/dL
Ketones, POC UA: NEGATIVE mg/dL
Leukocytes, UA: NEGATIVE
Nitrite, UA: NEGATIVE
Protein Ur, POC: NEGATIVE mg/dL
Spec Grav, UA: >=1.03 — AB (ref 1.010–1.025)
Urobilinogen, UA: 0.2 E.U./dL
pH, UA: 5.5 (ref 5.0–8.0)

## 2020-08-19 NOTE — Assessment & Plan Note (Signed)
Patient's last lipid panel was a year ago. Ordered Lipid panel and BMP. Patient follow up in 4 weeks with his PCP

## 2020-08-19 NOTE — Progress Notes (Signed)
    SUBJECTIVE:   CHIEF COMPLAINT / HPI:   65 year old male presents with right posterior lower back pain which he said started 4 weeks ago and lasted a day and half. Patient described the pain as a sharp constant pain which he rates 8/10. He said the pain does not radiate to any location, nothing aggravate or alleviates the pain. Patient denies any nausea, vomiting, fever or chills.  PERTINENT  PMH / PSH: Pancreatitis, TIA, HLD, tobacco use, substance abuse disorder.  Past Surgical History:  Procedure Laterality Date   COLONOSCOPY  06/10/2011   Procedure: COLONOSCOPY;  Surgeon: Shirley Friar, MD;  Location: [facility] ENDOSCOPY;  Service: Endoscopy;  Laterality: N/A;   CYST REMOVAL NECK     ESOPHAGOGASTRODUODENOSCOPY  06/10/2011   Procedure: ESOPHAGOGASTRODUODENOSCOPY (EGD);  Surgeon: Shirley Friar, MD;  Location: [facility] ENDOSCOPY;  Service: Endoscopy;  Laterality: N/A;   HAND SURGERY Left 01/09/2007   ring finger- tendon repair    Review of Systems  Constitutional:  Negative for chills, fever and weight loss.  Respiratory:  Negative for cough, hemoptysis and shortness of breath.   Cardiovascular:  Negative for chest pain, palpitations and leg swelling.  Gastrointestinal:  Negative for abdominal pain, constipation, diarrhea, heartburn, nausea and vomiting.  Genitourinary:  Negative for dysuria, flank pain and hematuria.   OBJECTIVE:   BP 136/80   Pulse 63   Ht 6\' 1"  (1.854 m)   Wt 248 lb (112.5 kg)   SpO2 97%   BMI 32.72 kg/m    Physical Exam General: Alert, well appearing, NAD, Oriented x4 Cardiovascular: RRR, No Murmurs, Normal S2/S2 Respiratory: CTAB, No wheezing or Rales Abdomen: No distension or tenderness Extremities: No edema on extremities   Renal: No CVA tenderness Skin: Warm and dry  ASSESSMENT/PLAN:   Right nephrolithiasis Patient reported sharp transient right lower back pain lasting a day and half. Pain self resolved; no CVA tenderness, fever, chills,  nausea or vomiting. Concerning for passed kidney stone -Ordered UA; result was negative -Encouraged appropriate hydration -educated patient on ways to avoid recurrent kidney stone which includes; reduced alcoholl and meat consumption, and avoid excess vitamin D supplement  HLD (hyperlipidemia) Patient's last lipid panel was a year ago. Ordered Lipid panel and BMP. Patient follow up in 4 weeks with his PCP    , MD [facility]

## 2020-08-19 NOTE — Assessment & Plan Note (Signed)
Patient reported sharp transient right lower back pain lasting a day and half. Pain self resolved; no CVA tenderness, fever, chills, nausea or vomiting. Concerning for passed kidney stone -Ordered UA; result was negative -Encouraged appropriate hydration -educated patient on ways to avoid recurrent kidney stone which includes; reduced alcoholl and meat consumption, and avoid excess vitamin D supplement

## 2020-08-19 NOTE — Patient Instructions (Addendum)
Harold Colon, It was wonderful to meet you today. Thank you for allowing me to be a part of your care. Below is a short summary of what we discussed at your visit today:  The lower right back pain was most likely a kidney stone which resolved from passing the stone. You denied having fever or chills. At this time there are no other concerns.  Your urine analysis came back negative.  Recommendations on ways to avoid future kidney stone include proper hydration, decreased alcohol and meat consumption, and Vitamin D  We will schedule a BMP test today and I recommend you follow up with your PCP in 4 weeks.  If you have any questions or concerns, please do not hesitate to contact us via phone or MyChart message.   Jerre Simon, MD [name]

## 2020-08-20 LAB — LIPID PANEL
Chol/HDL Ratio: 3.3 ratio (ref 0.0–5.0)
Cholesterol, Total: 199 mg/dL (ref 100–199)
HDL: 60 mg/dL (ref >39)
LDL Chol Calc (NIH): 126 mg/dL — ABNORMAL HIGH (ref 0–99)
Triglycerides: 73 mg/dL (ref 0–149)
VLDL Cholesterol Cal: 13 mg/dL (ref 5–40)

## 2020-08-20 LAB — BASIC METABOLIC PANEL
BUN/Creatinine Ratio: 12 (ref 10–24)
BUN: 11 mg/dL (ref 8–27)
CO2: 22 mmol/L (ref 20–29)
Calcium: 9.8 mg/dL (ref 8.6–10.2)
Chloride: 106 mmol/L (ref 96–106)
Creatinine, Ser: 0.89 mg/dL (ref 0.76–1.27)
Glucose: 90 mg/dL (ref 65–99)
Potassium: 4.3 mmol/L (ref 3.5–5.2)
Sodium: 144 mmol/L (ref 134–144)
eGFR: 96 mL/min/{1.73_m2} (ref >59)

## 2020-09-20 ENCOUNTER — Other Ambulatory Visit: Payer: Self-pay | Admitting: Family Medicine

## 2020-11-03 ENCOUNTER — Other Ambulatory Visit: Payer: Self-pay

## 2020-11-03 ENCOUNTER — Encounter (HOSPITAL_COMMUNITY): Payer: Self-pay

## 2020-11-03 ENCOUNTER — Ambulatory Visit (HOSPITAL_COMMUNITY)
Admission: EM | Admit: 2020-11-03 | Discharge: 2020-11-03 | Disposition: A | Discharge status: Home / Self Care | Payer: Medicaid Other | Attending: Family Medicine | Admitting: Family Medicine

## 2020-11-03 DIAGNOSIS — R590 Localized enlarged lymph nodes: Secondary | ICD-10-CM

## 2020-11-03 MED ORDER — CLINDAMYCIN HCL 300 MG PO CAPS
300.0000 mg | ORAL_CAPSULE | Freq: Three times a day (TID) | ORAL | 0 refills | Status: DC
Start: 2020-11-03 — End: 2022-01-26

## 2020-11-03 NOTE — ED Triage Notes (Signed)
Pt reports swelling and pain in the left sided neck x 1 day. States the glands in his neck are swollen.
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
## 2021-01-24 ENCOUNTER — Encounter (HOSPITAL_COMMUNITY): Payer: Self-pay

## 2021-01-24 ENCOUNTER — Other Ambulatory Visit: Payer: Self-pay

## 2021-01-24 ENCOUNTER — Ambulatory Visit (HOSPITAL_COMMUNITY)
Admission: EM | Admit: 2021-01-24 | Discharge: 2021-01-24 | Disposition: A | Discharge status: Home / Self Care | Payer: Medicaid Other | Attending: Family Medicine | Admitting: Family Medicine

## 2021-01-24 DIAGNOSIS — L0100 Impetigo, unspecified: Secondary | ICD-10-CM

## 2021-01-24 DIAGNOSIS — R21 Rash and other nonspecific skin eruption: Secondary | ICD-10-CM

## 2021-01-24 MED ORDER — DOXYCYCLINE HYCLATE 100 MG PO CAPS: 100.0000 mg | ORAL_CAPSULE | Freq: Two times a day (BID) | ORAL | 0 refills | Status: AC

## 2021-01-24 MED ORDER — PREDNISONE 20 MG PO TABS: 20.0000 mg | ORAL_TABLET | Freq: Every day | ORAL | 0 refills | Status: AC
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
## 2021-01-24 NOTE — ED Triage Notes (Signed)
Pt presents with rash under left arm on rib area X 1 week that is itchy and sore.

## 2021-01-24 NOTE — Discharge Instructions (Signed)
Take all medication as prescribed. For acute itching, you may take benadryl or Zrytec with the prednisone. Return if symptoms do not resolve following completion of treatment.

## 2021-02-13 ENCOUNTER — Encounter (HOSPITAL_COMMUNITY): Payer: Self-pay | Admitting: *Deleted

## 2021-02-13 ENCOUNTER — Ambulatory Visit (HOSPITAL_COMMUNITY)
Admission: EM | Admit: 2021-02-13 | Discharge: 2021-02-13 | Disposition: A | Discharge status: Home / Self Care | Payer: Medicaid Other | Attending: Urgent Care | Admitting: Urgent Care

## 2021-02-13 ENCOUNTER — Other Ambulatory Visit: Payer: Self-pay

## 2021-02-13 DIAGNOSIS — L249 Irritant contact dermatitis, unspecified cause: Secondary | ICD-10-CM

## 2021-02-13 DIAGNOSIS — R21 Rash and other nonspecific skin eruption: Secondary | ICD-10-CM

## 2021-02-13 MED ORDER — TRIAMCINOLONE ACETONIDE 0.1 % EX CREA: 1.0000 "application " | TOPICAL_CREAM | Freq: Two times a day (BID) | CUTANEOUS | 0 refills | Status: DC

## 2021-02-13 NOTE — ED Triage Notes (Signed)
Pt reports a rash around his Lt ear after using his brothers headphones.
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
## 2021-03-06 ENCOUNTER — Other Ambulatory Visit: Payer: Self-pay

## 2021-03-06 ENCOUNTER — Ambulatory Visit (INDEPENDENT_AMBULATORY_CARE_PROVIDER_SITE_OTHER): Payer: Medicaid Other | Admitting: Family Medicine

## 2021-03-06 VITALS — BP 124/76 | HR 82 | Ht 73.0 in | Wt 248.2 lb

## 2021-03-06 DIAGNOSIS — F1410 Cocaine abuse, uncomplicated: Secondary | ICD-10-CM | POA: Diagnosis not present

## 2021-03-06 DIAGNOSIS — Z114 Encounter for screening for human immunodeficiency virus [HIV]: Secondary | ICD-10-CM

## 2021-03-06 DIAGNOSIS — L739 Follicular disorder, unspecified: Secondary | ICD-10-CM | POA: Diagnosis not present

## 2021-03-06 DIAGNOSIS — Z1159 Encounter for screening for other viral diseases: Secondary | ICD-10-CM

## 2021-03-06 DIAGNOSIS — Z113 Encounter for screening for infections with a predominantly sexual mode of transmission: Secondary | ICD-10-CM

## 2021-03-06 MED ORDER — DOXYCYCLINE HYCLATE 100 MG PO TABS: 100.0000 mg | ORAL_TABLET | Freq: Two times a day (BID) | ORAL | 0 refills | Status: AC

## 2021-03-06 NOTE — Progress Notes (Signed)
° ° °  SUBJECTIVE:   CHIEF COMPLAINT / HPI:   Rash Patient reports he has a rash that started in his left axilla in the middle of January.  He was evaluated in the emergency department and given an antibiotic for 7 days which did help with the rash.  He reports that it then got worse again and covered both axillas.  Reports that he then went to an urgent care where he was told he needed follow-up with a dermatologist but they did not feel he needed antibiotics at that time.  Denies any fever, chills, weight loss.  Reports that he is sexually active and would like to be tested for HIV and other blood-borne STDs.  Denies any injection drug use but acknowledges smoking cocaine.   OBJECTIVE:   BP 124/76    Pulse 82    Ht 6\' 1"  (1.854 m)    Wt 248 lb 3.2 oz (112.6 kg)    SpO2 98%    BMI 32.75 kg/m   General: Pleasant 66 year old male HEENT: Extremely poor dentition with caries on almost all of his teeth.  No erythema around the gums or active drainage appreciated Cardiac: Regular rate and rhythm, no murmurs appreciated Respiratory: Normal work of breathing, speaking in full sentences MSK: No gross abnormalities Derm: Erythematous macules and pustules appreciated concentrated around axilla this which then spread out down his torso, nonpainful, not itching, no excoriations, see image below       ASSESSMENT/PLAN:   Folliculitis Patient's dermatologic findings concerning for folliculitis.  Will prescribe a longer dose of doxycycline.  Prescription sent for 100 mg twice daily for 14 days.  Discussed that if did not improve/resolved he needs to return for biopsy.  Discussed strict ED precautions  Screening for STD (sexually transmitted disease) Patient reports that he is sexually active and would like to be checked for STDs.  Denies any symptoms such as burning with urination or lesions on his genitalia. -HIV and RPR test ordered  [facility]oc[facility] Patient reports smoking cocaine.  Discussed  the importance of cessation.     [facility], MD [facility]

## 2021-03-06 NOTE — Patient Instructions (Signed)
It was a pleasure meeting you today.  The rash under your arms looks like it may be what is called a folliculitis.  I want to prescribe an antibiotic called doxycycline for 14 days.  You will take it twice daily.  I am also going to check you for your blood work for HIV as well as syphilis.  I will call you with these results but please activate your MyChart so I can send you a message if everything looks normal.  If it does not get better in the next 2 weeks to 1 month please be reevaluated for possible biopsy.  If you have any questions or concerns call the clinic.  I hope you have a wonderful afternoon!

## 2021-03-07 LAB — RPR: RPR Ser Ql: NONREACTIVE

## 2021-03-07 LAB — HIV ANTIBODY (ROUTINE TESTING W REFLEX): HIV Screen 4th Generation wRfx: NONREACTIVE

## 2021-03-07 NOTE — Assessment & Plan Note (Addendum)
Patient reports that he is sexually active and would like to be checked for STDs.  Denies any symptoms such as burning with urination or lesions on his genitalia. -HIV and RPR test ordered

## 2021-03-07 NOTE — Assessment & Plan Note (Signed)
Patient's dermatologic findings concerning for folliculitis.  Will prescribe a longer dose of doxycycline.  Prescription sent for 100 mg twice daily for 14 days.  Discussed that if did not improve/resolved he needs to return for biopsy.  Discussed strict ED precautions

## 2021-03-07 NOTE — Assessment & Plan Note (Signed)
Patient reports smoking cocaine.  Discussed the importance of cessation.

## 2021-04-27 IMAGING — CT CT ANGIO HEAD
4 of 13 series · 15 of 47 positions shown · IV contrast (APPLIED)
Comparison: Head CT yesterday.  MRI earlier same day.

CLINICAL DATA: Acute presentation with slurred speech which has
resolved.

EXAM:
CT ANGIOGRAPHY HEAD AND NECK
TECHNIQUE: Multidetector CT imaging of the head and neck was performed using
the standard protocol during bolus administration of intravenous
contrast. Multiplanar CT image reconstructions and MIPs were
obtained to evaluate the vascular anatomy. Carotid stenosis
measurements (when applicable) are obtained utilizing NASCET
criteria, using the distal internal carotid diameter as the
denominator.
CONTRAST:  75mL OMNIPAQUE IOHEXOL 350 MG/ML SOLN

[Series 9: arterial 1 thin · axial · arterial · 0.48mm/px · z∈[-229,+64]mm · 8 of 675 slices shown]
[im 45/675  brain]
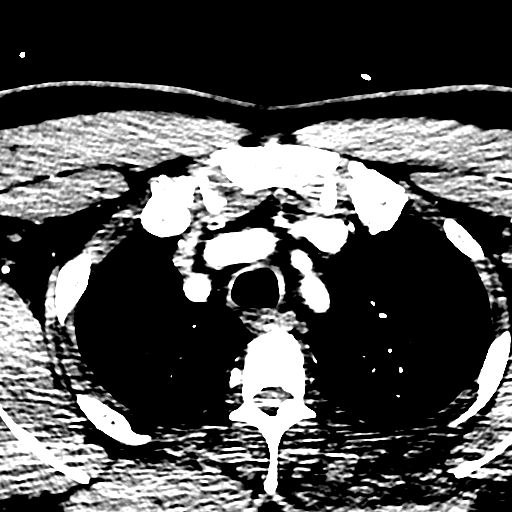
[im 135/675  brain]
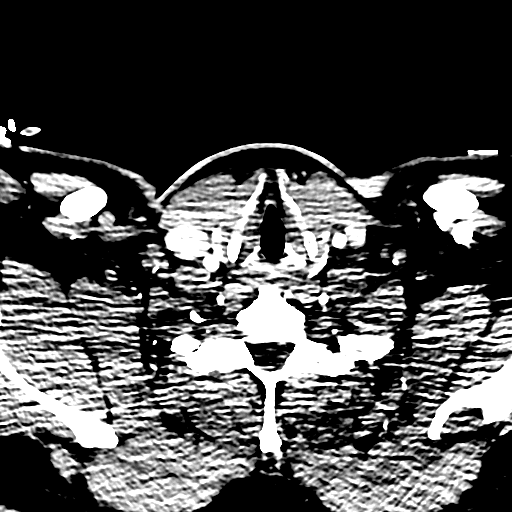
[im 225/675  brain]
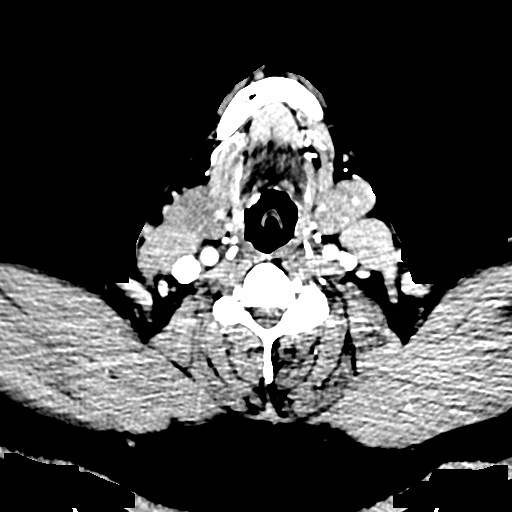
[im 315/675  brain]
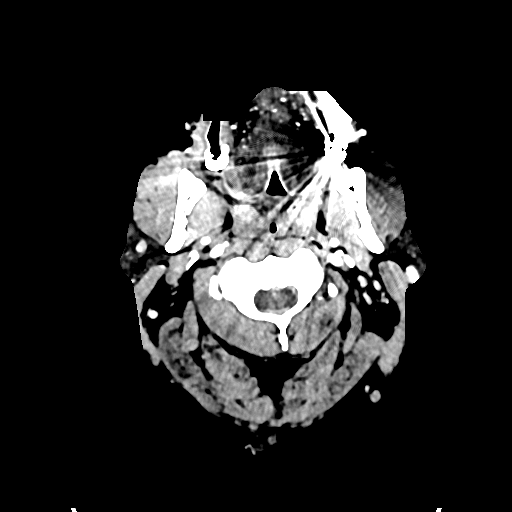
[im 360/675  brain]
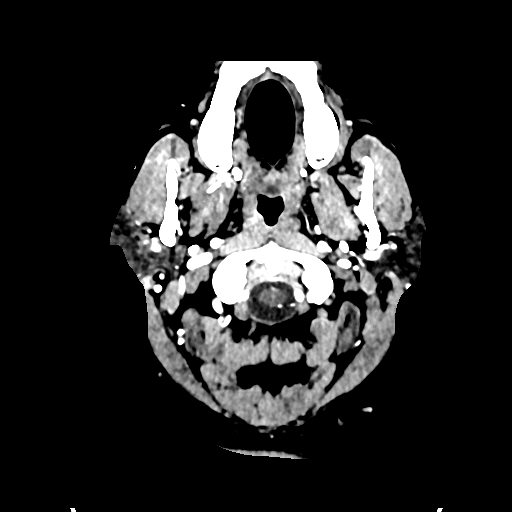
[im 450/675  brain]
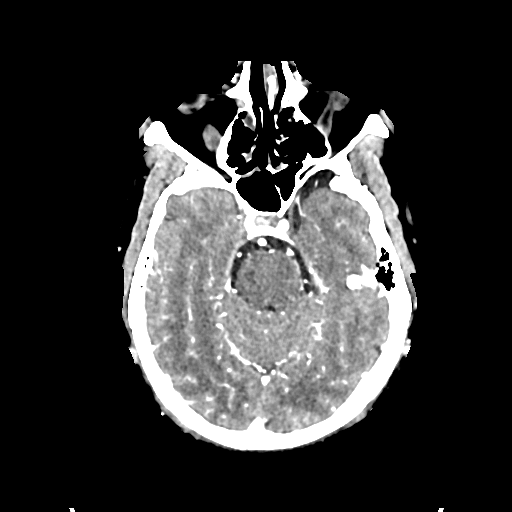
[im 540/675  brain]
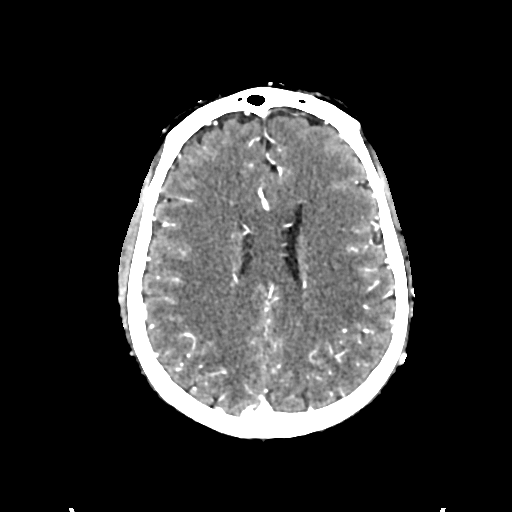
[im 630/675  brain]
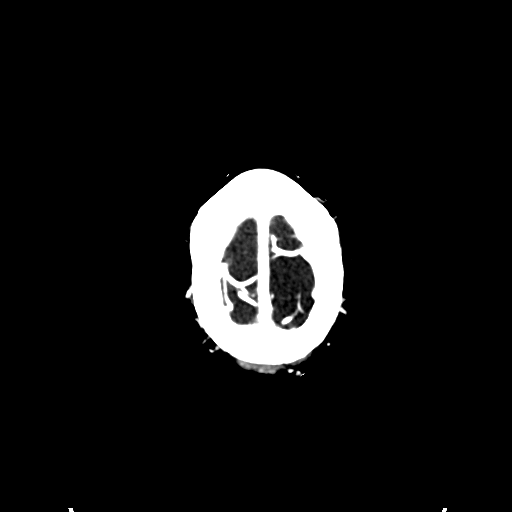

[Series 10: ax thick · axial · 0.47mm/px · z∈[-256,+86]mm · 3 of 115 slices shown]
[im 1/115  brain]
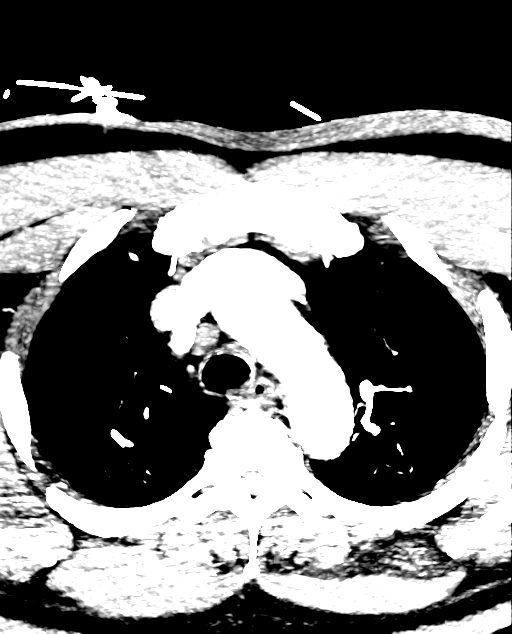
[im 58/115  bone]
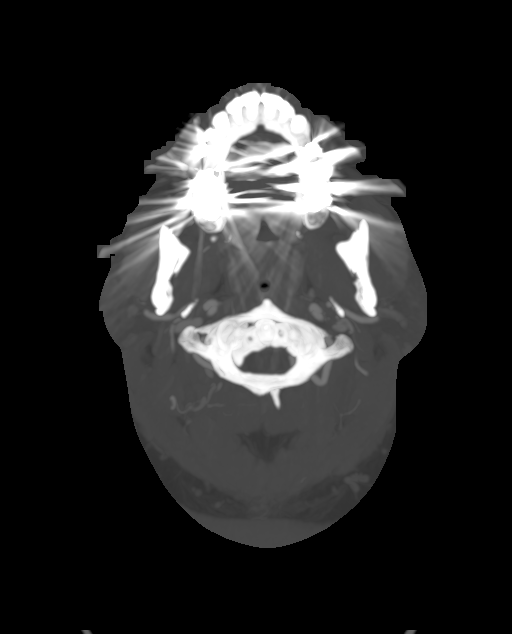
[im 115/115  brain]
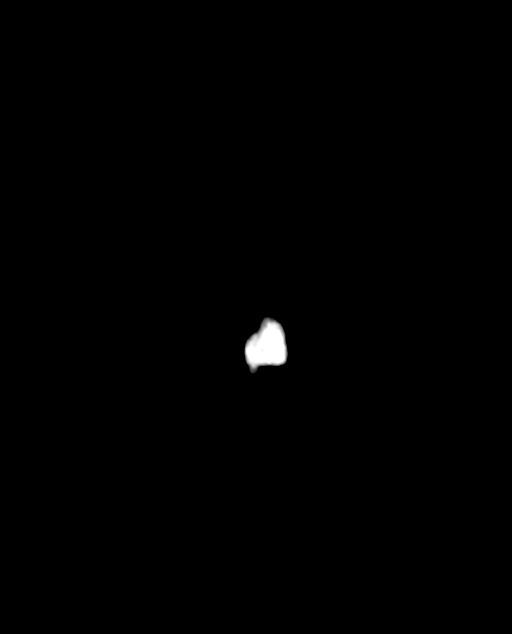

[Series 14: early cor thick · coronal · arterial · 0.33mm/px · 2 of 74 slices shown]
[im 25/74  brain]
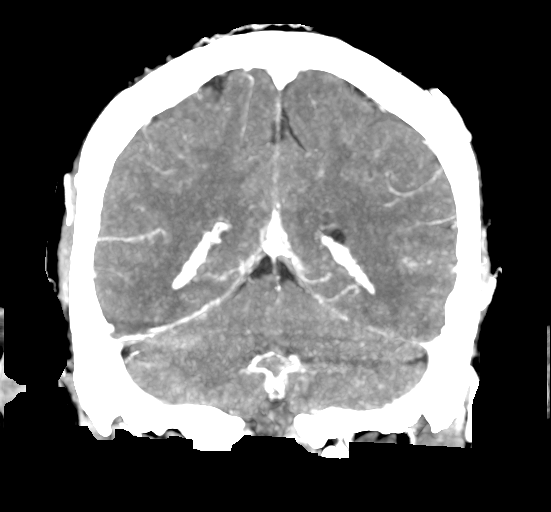
[im 49/74  brain]
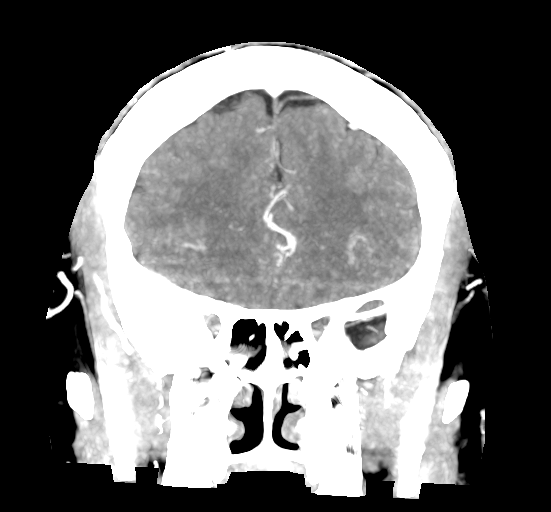

[Series 15: early sag thick · sagittal · arterial · 0.32mm/px · 2 of 62 slices shown]
[im 24/62  brain]
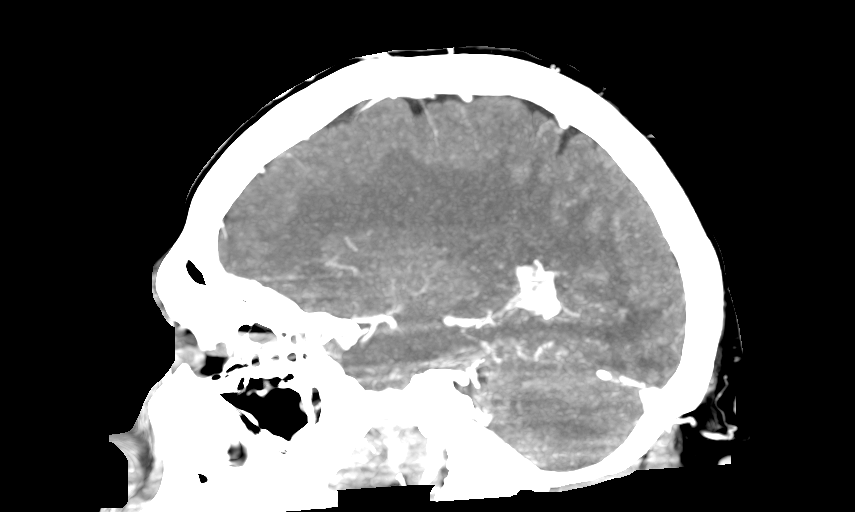
[im 48/62  brain]
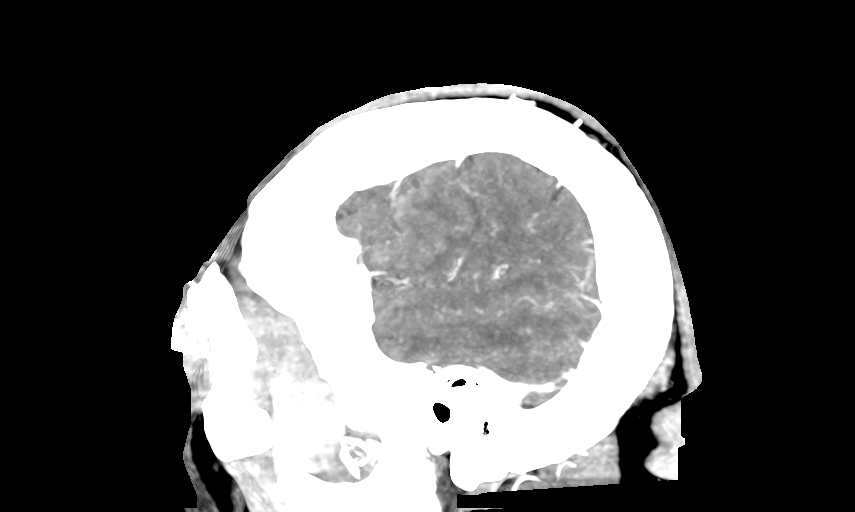

[15 of 47 positions shown; findings below may reference images not displayed]

FINDINGS: CTA NECK FINDINGS

Aortic arch: Aortic atherosclerosis. No aneurysm or dissection
visible. Branching pattern is normal without origin stenosis.

Right carotid system: Common carotid artery widely patent to the
bifurcation. Carotid bifurcation is normal without soft or calcified
plaque. Cervical ICA is normal.

Left carotid system: Common carotid artery widely patent to the
bifurcation. Carotid bifurcation is normal without soft or calcified
plaque. Cervical ICA is normal.

Vertebral arteries: Dominant left vertebral artery origin is widely
patent. Non dominant right vertebral artery origin shows 50%
stenosis. Beyond that, both vertebral arteries widely patent through
the cervical region to the foramen magnum.

Skeleton: Ordinary cervical spondylosis.

Other neck: No mass or lymphadenopathy.

Upper chest: Emphysema.  No active process.

Review of the MIP images confirms the above findings

CTA HEAD FINDINGS

Anterior circulation: Both internal carotid arteries widely patent
through the skull base. Atherosclerotic calcification in both
carotid siphon regions but without stenosis greater than 30%. The
anterior and middle cerebral vessels are patent without proximal
stenosis, aneurysm or vascular malformation. No large or medium
vessel occlusion.

Posterior circulation: Both vertebral arteries widely patent to the
basilar. No basilar stenosis. Posterior circulation branch vessels
are normal.

Venous sinuses: Patent and normal.

Anatomic variants: None significant.

Review of the MIP images confirms the above findings
IMPRESSION: No acute large or medium vessel occlusion.

Aortic Atherosclerosis (E13IU-W1I.I) and Emphysema (E13IU-VG0.I).

No atherosclerotic change at either carotid bifurcation.

50% stenosis of the right vertebral artery origin. Dominant left
vertebral artery widely patent.

Atherosclerotic calcification in both carotid siphon regions but
without stenosis greater than 30%.

## 2021-04-27 IMAGING — MR MR HEAD W/O CM
10 of 11 series · 43 of 48 positions shown · non-contrast
Comparison: Prior head CT from 07/08/2019.

CLINICAL DATA: Initial evaluation for acute dizziness, slurred
speech.

EXAM:
MRI HEAD WITHOUT CONTRAST
TECHNIQUE: Multiplanar, multiecho pulse sequences of the brain and surrounding
structures were obtained without intravenous contrast.

[Series 5: DWI · axial · 3.0mm · 0.88mm/px · z∈[-58,+87]mm · 10 of 100 slices shown (1 of 4)]
[im 1/100]
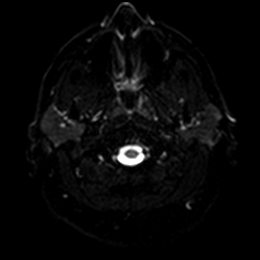
[im 12/100]
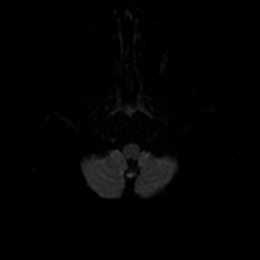
[im 23/100]
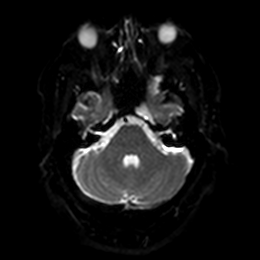
[im 34/100]
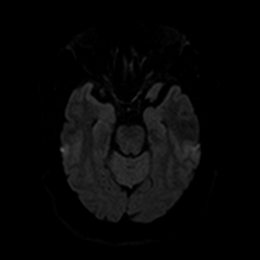
[im 45/100]
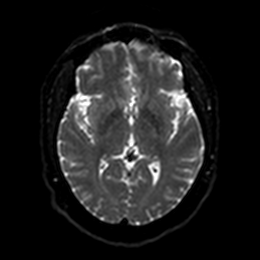
[im 56/100]
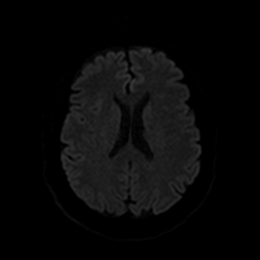
[im 67/100]
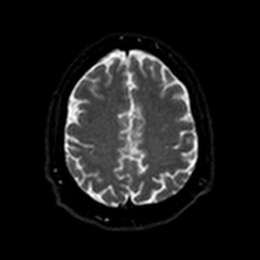
[im 78/100]
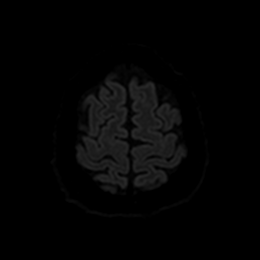
[im 89/100]
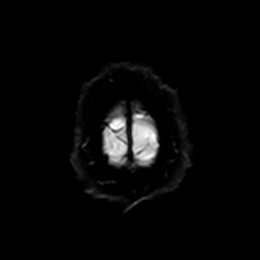
[im 100/100]
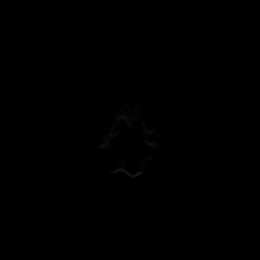

[Series 6: DWI · axial · 3.0mm · 0.88mm/px · z∈[-58,+87]mm · 5 of 50 slices shown (2 of 4)]
[im 1/50]
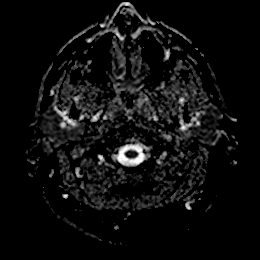
[im 13/50]
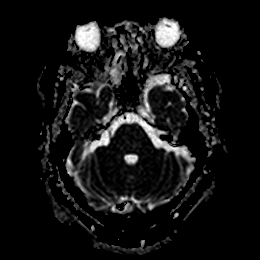
[im 25/50]
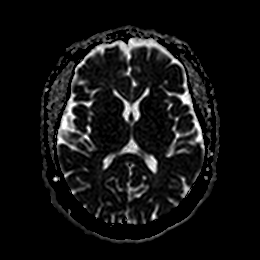
[im 37/50]
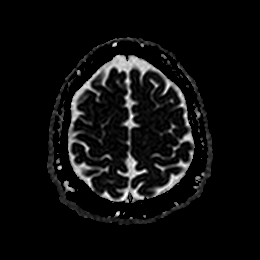
[im 50/50]
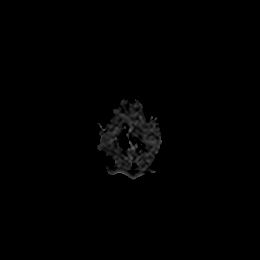

[Series 7: DWI · coronal · 4.0mm · 0.88mm/px · 6 of 66 slices shown (3 of 4)]
[im 1/66]
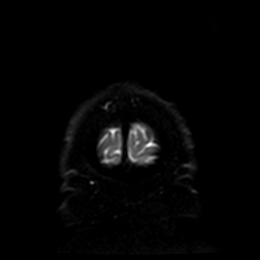
[im 14/66]
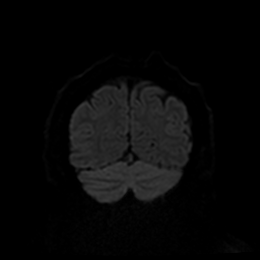
[im 27/66]
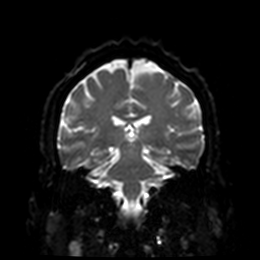
[im 40/66]
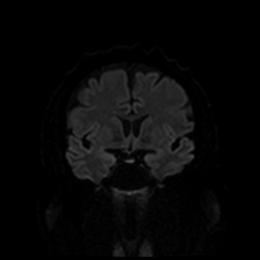
[im 53/66]
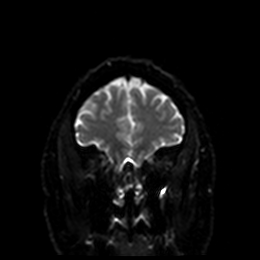
[im 66/66]
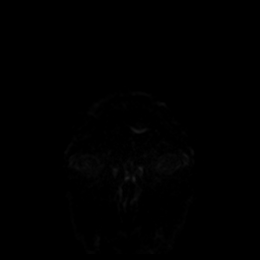

[Series 8: DWI · coronal · 4.0mm · 0.88mm/px · 3 of 33 slices shown (4 of 4)]
[im 1/33]
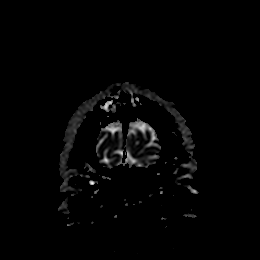
[im 17/33]
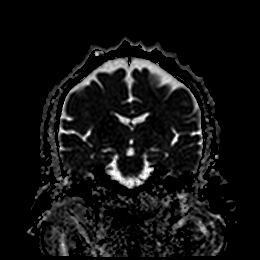
[im 33/33]
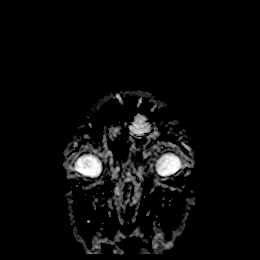

[Series 9: T1 · sagittal · 5.0mm · 0.75mm/px · 2 of 23 slices shown]
[im 1/23]
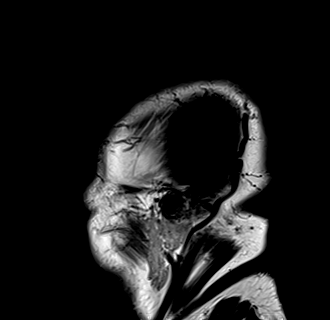
[im 23/23]
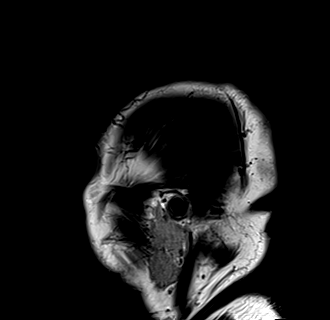

[Series 10: T2 · axial · 5.0mm · 0.72mm/px · z∈[-56,+86]mm · 2 of 25 slices shown (1 of 2)]
[im 1/25]
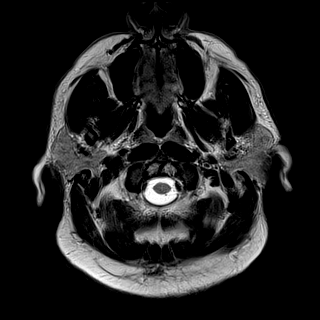
[im 25/25]
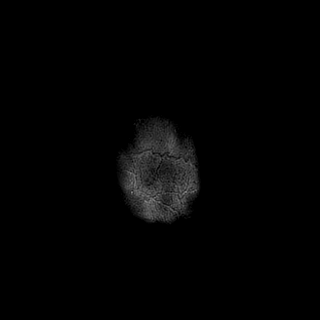

[Series 11: FLAIR · axial · 5.0mm · 0.45mm/px · z∈[-57,+85]mm · 2 of 25 slices shown]
[im 1/25]
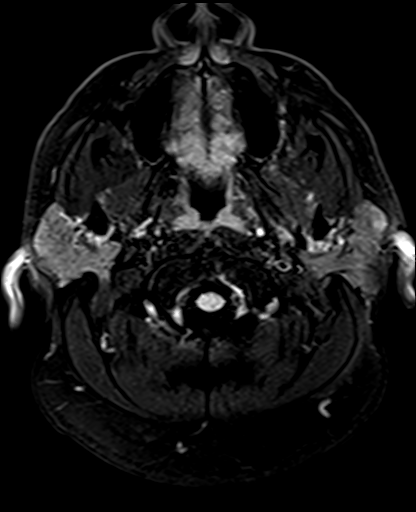
[im 25/25]
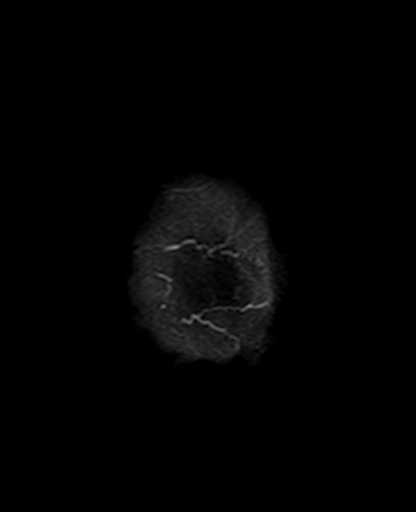

[Series 13: pha_images · axial · 3.0mm · 0.90mm/px · z∈[-74,+98]mm · 5 of 58 slices shown]
[im 1/58]
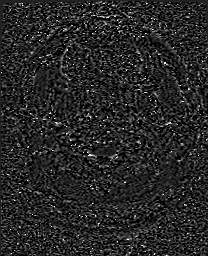
[im 15/58]
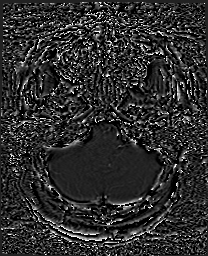
[im 29/58]
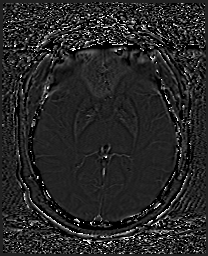
[im 43/58]
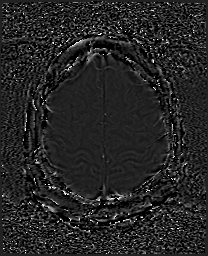
[im 58/58]
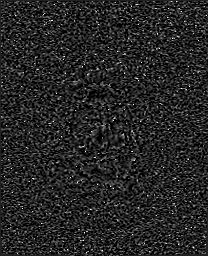

[Series 14: swi_images · axial · 3.0mm · 0.90mm/px · z∈[-74,+101]mm · 5 of 60 slices shown]
[im 1/60]
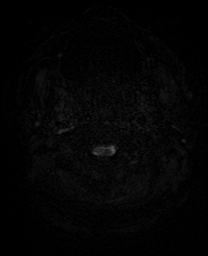
[im 15/60]
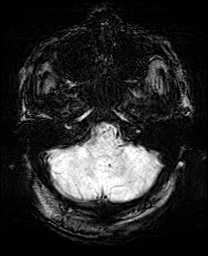
[im 30/60]
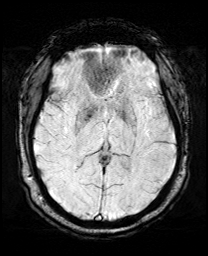
[im 45/60]
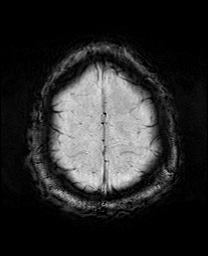
[im 60/60]
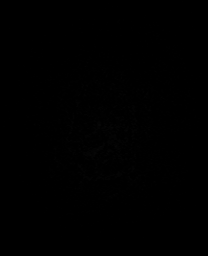

[Series 17: T2 · coronal · 5.0mm · 0.34mm/px · 3 of 29 slices shown (2 of 2)]
[im 1/29]
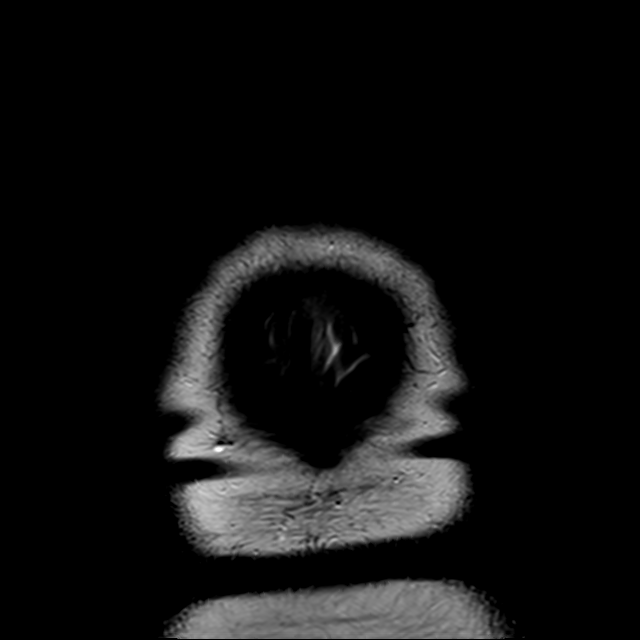
[im 15/29]
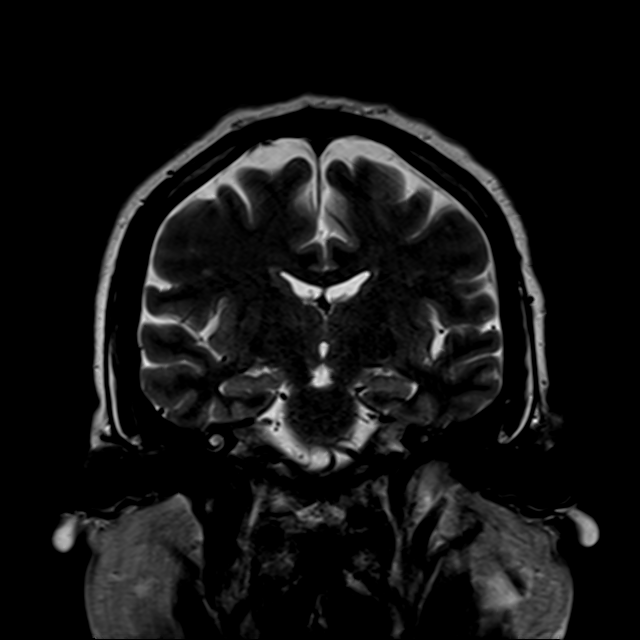
[im 29/29]
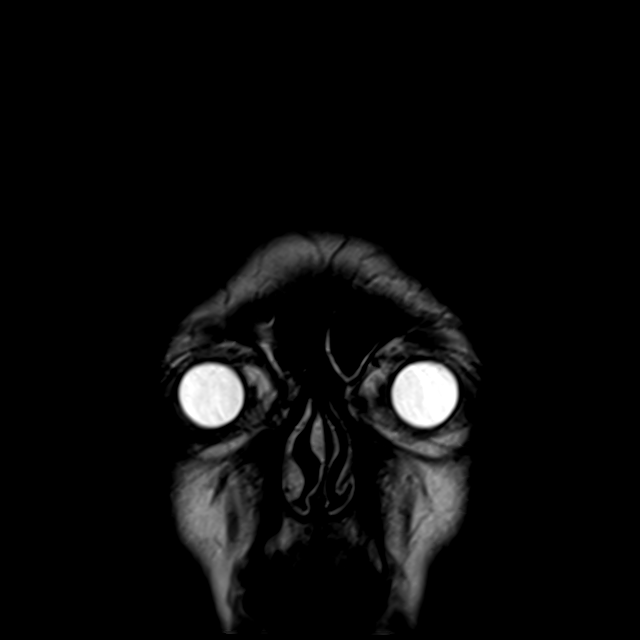

[43 of 48 positions shown; findings below may reference images not displayed]

FINDINGS: Brain: Cerebral volume within normal limits for patient age. Minimal
scattered T2/FLAIR hyperintensities noted involving the
supratentorial cerebral white matter, nonspecific, but most commonly
related to chronic microvascular ischemic disease, appearance fairly
typical and mild for age.

No abnormal foci of restricted diffusion to suggest acute or
subacute ischemia. Gray-white matter differentiation well
maintained. No encephalomalacia to suggest chronic infarction. No
foci of susceptibility artifact to suggest acute or chronic
intracranial hemorrhage.

No mass lesion, midline shift or mass effect. No hydrocephalus.
Probable small arachnoid cyst noted at the anterior left middle
cranial fossa without associated mass effect. No other extra-axial
fluid collection. Major dural sinuses are grossly patent.

Pituitary gland and suprasellar region are normal. Midline
structures intact and normal.

Vascular: Major intracranial vascular flow voids well maintained and
normal in appearance.

Skull and upper cervical spine: Craniocervical junction normal.
Visualized upper cervical spine within normal limits. Bone marrow
signal intensity normal. No scalp soft tissue abnormality.

Sinuses/Orbits: Globes and orbital soft tissues within normal
limits.

Scattered mucosal thickening noted within the frontoethmoidal
sinuses, sphenoid sinuses, and left maxillary sinus. No air-fluid
levels to suggest acute sinusitis. No mastoid effusion. Inner ear
structures grossly normal.

Other: None.
IMPRESSION: Normal brain MRI for age. No acute intracranial abnormality
identified.

## 2021-08-09 ENCOUNTER — Emergency Department (HOSPITAL_COMMUNITY): Payer: Medicaid Other

## 2021-08-09 ENCOUNTER — Encounter (HOSPITAL_COMMUNITY): Payer: Self-pay | Admitting: Emergency Medicine

## 2021-08-09 ENCOUNTER — Emergency Department (HOSPITAL_COMMUNITY)
Admission: EM | Admit: 2021-08-09 | Discharge: 2021-08-10 | Disposition: A | Discharge status: Home / Self Care | Payer: Medicaid Other | Attending: Emergency Medicine | Admitting: Emergency Medicine

## 2021-08-09 DIAGNOSIS — Y990 Civilian activity done for income or pay: Secondary | ICD-10-CM | POA: Insufficient documentation

## 2021-08-09 DIAGNOSIS — Z91040 Latex allergy status: Secondary | ICD-10-CM | POA: Diagnosis not present

## 2021-08-09 DIAGNOSIS — M5442 Lumbago with sciatica, left side: Secondary | ICD-10-CM | POA: Diagnosis not present

## 2021-08-09 DIAGNOSIS — X500XXA Overexertion from strenuous movement or load, initial encounter: Secondary | ICD-10-CM | POA: Insufficient documentation

## 2021-08-09 DIAGNOSIS — Z7982 Long term (current) use of aspirin: Secondary | ICD-10-CM | POA: Insufficient documentation

## 2021-08-09 DIAGNOSIS — Z7901 Long term (current) use of anticoagulants: Secondary | ICD-10-CM | POA: Diagnosis not present

## 2021-08-09 DIAGNOSIS — M545 Low back pain, unspecified: Secondary | ICD-10-CM | POA: Diagnosis present

## 2021-08-09 NOTE — ED Provider Triage Note (Signed)
Emergency Medicine Provider Triage Evaluation Note  ABDALRAHMAN CLEMENTSON , a 66 y.o. male  was evaluated in triage.  Pt complains of Back pain for 1 week.  History of 2 bulging disc.  No new traumas or any falls.  Recently been trying to move into a new residence and requires and picking up heavy objects and boxes.  No over-the-counter medications trialed prior to arrival.  Denies any saddle anesthesia, urinary incontinence, fecal incontinence, or urinary retention.  Has any history of IV drug use.  Review of Systems  Positive:  Negative:   Physical Exam  There were no vitals taken for this visit. Gen:   Awake, no distress   Resp:  Normal effort  MSK:   Moves extremities without difficulty  Other:  Bilateral lower paraspinal tenderness. No midline tenderness.   Medical Decision Making  Medically screening exam initiated at 2:54 PM.  Appropriate orders placed.  MAKYLE ESLICK was informed that the remainder of the evaluation will be completed by another provider, this initial triage assessment does not replace that evaluation, and the importance of remaining in the ED until their evaluation is complete.  We will order plain film of lumbar.   Achille Rich, [place] 08/09/21 1458

## 2021-08-09 NOTE — ED Triage Notes (Signed)
Patient BIB [facility] from home with complaint of back pain for the last week, history of two bulging discs, has recently been lifting heavy objects as he moves to a new home. Denies urine and stool incontinence. Patient is alert, oriented, and in no apparent distress at this time.

## 2021-08-10 LAB — URINALYSIS, ROUTINE W REFLEX MICROSCOPIC
Bacteria, UA: NONE SEEN
Bilirubin Urine: NEGATIVE
Glucose, UA: NEGATIVE mg/dL
Hgb urine dipstick: NEGATIVE
Ketones, ur: NEGATIVE mg/dL
Leukocytes,Ua: NEGATIVE
Nitrite: NEGATIVE
Protein, ur: 30 mg/dL — AB
Specific Gravity, Urine: 1.03 (ref 1.005–1.030)
pH: 5 (ref 5.0–8.0)

## 2021-08-10 MED ORDER — KETOROLAC TROMETHAMINE 60 MG/2ML IM SOLN
20.0000 mg | Freq: Once | INTRAMUSCULAR | Status: AC
  Administered 2021-08-10: 20 mg via INTRAMUSCULAR
  Filled 2021-08-10: qty 2

## 2021-08-10 MED ORDER — OXYCODONE-ACETAMINOPHEN 5-325 MG PO TABS
1.0000 | ORAL_TABLET | Freq: Once | ORAL | Status: AC
  Administered 2021-08-10: 1 via ORAL
  Filled 2021-08-10: qty 1

## 2021-08-10 MED ORDER — PREDNISONE 20 MG PO TABS: 40.0000 mg | ORAL_TABLET | Freq: Every day | ORAL | 0 refills | Status: AC

## 2021-08-10 MED ORDER — CYCLOBENZAPRINE HCL 10 MG PO TABS: 10.0000 mg | ORAL_TABLET | Freq: Two times a day (BID) | ORAL | 0 refills | Status: DC | PRN

## 2021-08-10 NOTE — Discharge Instructions (Signed)
You have been seen here for back pain, I recommend taking over-the-counter pain medications like ibuprofen and/or Tylenol every 6 as needed.  Please follow dosage and on the back of bottle.  I also recommend applying heat to the area and stretching out the muscles as this will help decrease stiffness and pain.  I have given you information on exercises please follow.  Given you a muscle relaxer as well as steroids take as prescribed  Follow-up with neurosurgery for further eval.  Come back to the emergency department if you develop chest pain, shortness of breath, severe abdominal pain, uncontrolled nausea, vomiting, diarrhea.

## 2021-08-10 NOTE — ED Provider Notes (Signed)
[facility] EMERGENCY DEPARTMENT Provider Note   CSN: [id number] Arrival date & time: 08/09/21  1454     History  Chief Complaint  Patient presents with   Back Pain    Harold Colon is a 66 y.o. male.  HPI  Medical history including polysubstance dependency, chronic pain, pancreatitis presents with complaints of left back pain.  Patient states that started about 1 week ago, started after Harold Colon was lifting up some objects while doing yard work.  Patient has pain is mainly in his left lower back will occasionally get under his left lower leg, denies saddle paresthesias urinary or bowel incontinence any.  States pain is worse with movement improved with rest, has had nothing for pain.  Notes that Harold Colon has a history of bulging disks and is concerned that Harold Colon worsen them.  Home Medications Prior to Admission medications   Medication Sig Start Date End Date Taking? Authorizing Provider  cyclobenzaprine (FLEXERIL) 10 MG tablet Take 1 tablet (10 mg total) by mouth 2 (two) times daily as needed for muscle spasms. 08/10/21  Yes Carroll Sage, PA-C  predniSONE (DELTASONE) 20 MG tablet Take 2 tablets (40 mg total) by mouth daily for 5 days. 08/10/21 08/15/21 Yes Carroll Sage, PA-C  albuterol (VENTOLIN HFA) 108 (90 Base) MCG/ACT inhaler INHALE 1 TO 2 PUFFS INTO THE LUNGS EVERY 6 HOURS AS NEEDED FOR WHEEZING OR SHORTNESS OF BREATH 09/20/20   Shelby Mattocks, DO  aspirin EC 81 MG EC tablet Take 1 tablet (81 mg total) by mouth daily. Swallow whole. 07/10/19   Allayne Stack, DO  atorvastatin (LIPITOR) 80 MG tablet Take 1 tablet (80 mg total) by mouth daily. 02/02/19   Sandre Kitty, MD  B COMPLEX VITAMINS PO Take 1 tablet by mouth daily.    [provider]  buPROPion (WELLBUTRIN XL) 150 MG 24 hr tablet Take 1 tablet (150 mg total) by mouth daily. 11/28/18   Sandre Kitty, MD  calamine lotion Apply 1 application topically as needed for itching. 04/27/19   Lamptey,  Britta Mccreedy, MD  clindamycin (CLEOCIN) 300 MG capsule Take 1 capsule (300 mg total) by mouth 3 (three) times daily. 11/03/20   Mardella Layman, MD  clopidogrel (PLAVIX) 75 MG tablet Take 1 tablet (75 mg total) by mouth daily. 07/10/19   Allayne Stack, DO  dextromethorphan-guaiFENesin (MUCINEX DM) 30-600 MG 12hr tablet Take 1 tablet by mouth 2 (two) times daily. 06/19/19   Wieters, Hallie C, PA-C  diphenhydrAMINE (BENADRYL) 25 MG tablet Take 25 mg by mouth every 6 (six) hours as needed for itching or allergies.    [provider]  Elastic Bandages & Supports (WRAPAROUND WRIST SUPPORT) MISC 1 each by Does not apply route as needed. 09/16/17   Garnette Gunner, MD  ezetimibe (ZETIA) 10 MG tablet Take 1 tablet (10 mg total) by mouth daily. 08/25/19 02/21/20  Sandre Kitty, MD  hydrocortisone valerate ointment (WESTCORT) 0.2 % Apply 1 application topically 2 (two) times daily. 09/05/15   Marquette Saa, MD  hydrOXYzine (ATARAX/VISTARIL) 25 MG tablet Take 0.5-1 tablets (12.5-25 mg total) by mouth every 8 (eight) hours as needed for itching. 04/05/20   Wallis Bamberg, PA-C  Multiple Vitamin (MULTIVITAMIN ADULT PO) Take by mouth.    [provider]  Multiple Vitamins-Minerals (ZINC PO) Take 1 tablet by mouth daily.    [provider]  nicotine (NICODERM CQ - DOSED IN MG/24 HOURS) 14 mg/24hr patch Place 1  patch (14 mg total) onto the skin daily. 01/30/19   Sandre Kitty, MD  nicotine polacrilex (NICORETTE) 4 MG gum Take 1 each (4 mg total) by mouth as needed for smoking cessation. 08/25/19   Sandre Kitty, MD  triamcinolone cream (KENALOG) 0.1 % Apply 1 application topically 2 (two) times daily. 02/13/21   Wallis Bamberg, PA-C      Allergies    Latex    Review of Systems   Review of Systems  Constitutional:  Negative for chills and fever.  Respiratory:  Negative for shortness of breath.   Cardiovascular:  Negative for chest pain.  Gastrointestinal:  Negative for abdominal  pain.  Musculoskeletal:  Positive for back pain.  Neurological:  Negative for headaches.    Physical Exam Updated Vital Signs BP (!) 133/95   Pulse 72   Temp 99.6 F (37.6 C) (Oral)   Resp 16   SpO2 99%  Physical Exam Vitals and nursing note reviewed.  Constitutional:      General: Harold Colon is not in acute distress.    Appearance: Harold Colon is not ill-appearing.  HENT:     Head: Normocephalic and atraumatic.     Nose: No congestion.  Eyes:     Conjunctiva/sclera: Conjunctivae normal.  Cardiovascular:     Rate and Rhythm: Normal rate and regular rhythm.     Pulses: Normal pulses.     Heart sounds: No murmur heard.    No friction rub. No gallop.  Pulmonary:     Effort: Pulmonary effort is normal.  Abdominal:     Palpations: Abdomen is soft.     Tenderness: There is no abdominal tenderness. There is no right CVA tenderness or left CVA tenderness.  Musculoskeletal:     Comments: Spine was palpated was nontender to palpation no step-off or deformities noted, patient had noted tenderness within his left gluteus, Harold Colon had positive straight leg raise on the left side, neurovascularly intact in lower extremities bilaterally.  Skin:    General: Skin is warm and dry.  Neurological:     Mental Status: Harold Colon is alert.  Psychiatric:        Mood and Affect: Mood normal.     ED Results / Procedures / Treatments   Labs (all labs ordered are listed, but only abnormal results are displayed) Labs Reviewed  URINALYSIS, ROUTINE W REFLEX MICROSCOPIC - Abnormal; Notable for the following components:      Result Value   Color, Urine AMBER (*)    APPearance HAZY (*)    Protein, ur 30 (*)    All other components within normal limits    EKG None  Radiology DG Lumbar Spine Complete  Result Date: 08/09/2021 CLINICAL DATA:  Back pain EXAM: LUMBAR SPINE - COMPLETE 4+ VIEW COMPARISON:  CT 11/09/2016 FINDINGS: There is a transitional lumbosacral vertebrae with partially lumbarized S1 and rudimentary disc at  S1-S2. There is no evidence of lumbar spine fracture. There is moderate degenerative disc disease at L3-L4 and L4-L5. Moderate lower lumbar predominant facet arthropathy, worst at L5-S1. IMPRESSION: Transitional lumbosacral vertebrae with lumbarized S1. Moderate degenerative disc disease at L3-L4 and L4-L5. Moderate lower lumbar predominant facet arthropathy worst at L5-S1. No evidence of lumbar spine fracture. Electronically Signed   By: Caprice Renshaw M.D.   On: 08/09/2021 15:53    Procedures Procedures    Medications Ordered in ED Medications  oxyCODONE-acetaminophen (PERCOCET/ROXICET) 5-325 MG per tablet 1 tablet (has no administration in time range)  ketorolac (TORADOL) injection 20 mg (  has no administration in time range)    ED Course/ Medical Decision Making/ A&P                           Medical Decision Making Risk Prescription drug management.   This patient presents to the ED for concern of back pain, this involves an extensive number of treatment options, and is a complaint that carries with it a high risk of complications and morbidity.  The differential diagnosis includes spina equina, dissection, aneurysm, kidney stone    Additional history obtained:  Additional history obtained from N/A External records from outside source obtained and reviewed including devious ED notes   Co morbidities that complicate the patient evaluation  Polysubstance dependency  Social Determinants of Health:  N/A    Lab Tests:  I Ordered, and personally interpreted labs.  The pertinent results include: UA unremarkable   Imaging Studies ordered:  I ordered imaging studies including DG lumbar spine I independently visualized and interpreted imaging which showed no evidence of acute fracture I agree with the radiologist interpretation   Cardiac Monitoring:  The patient was maintained on a cardiac monitor.  I personally viewed and interpreted the cardiac monitored which showed an  underlying rhythm of: N/A   Medicines ordered and prescription drug management:  I ordered medication including oxycodone I have reviewed the patients home medicines and have made adjustments as needed  Critical Interventions:  N/A   Reevaluation:  Presents with back pain, triage obtain lab work and imaging which I personally reviewed they are unremarkable, benign physical exam, likely muscular in nature, patient is in agreement plan discharge at this time.   Consultations Obtained:  N/A   Test Considered:  N/A    Rule out I have low suspicion for spinal fracture or spinal cord abnormality as patient denies urinary incontinency, retention, difficulty with bowel movements, denies saddle paresthesias.  Spine was palpated there is no step-off, crepitus or gross deformities felt,  full range of motion, neurovascular fully intact in the lower extremities.  Imaging negative for fractures or dislocation.  Low suspicion for dissection or aneurysm as presentation atypical, pain is focalized easily reproducible more consistent with muscular strain.  I doubt UTI [place] or kidney stone as UA is negative for signs of infection or hematuria and no CVA tenderness no urinary symptoms.    Dispostion and problem list  After consideration of the diagnostic results and the patients response to treatment, I feel that the patent would benefit from discharge   Back pain-likely this is multifactorial mainly sciatica with possible flareup of his bulging disks in his lumbar spine.  We will start him on steroids, muscle relaxers, follow-up with neurosurgery for further evaluation and strict return precautions.              Final Clinical Impression(s) / ED Diagnoses Final diagnoses:  Acute left-sided low back pain with left-sided sciatica    Rx / DC Orders ED Discharge Orders          Ordered    predniSONE (DELTASONE) 20 MG tablet  Daily        08/10/21 0255    cyclobenzaprine  (FLEXERIL) 10 MG tablet  2 times daily PRN        08/10/21 0255              Carroll Sage, PA-C 08/10/21 0258    Zadie Rhine, MD 08/10/21 [phone]

## 2021-08-10 NOTE — ED Notes (Signed)
Patient verbalizes understanding of discharge instructions. Opportunity for questioning and answers were provided. Armband removed by staff, pt discharged from ED. Wheeled out to lobby  

## 2021-08-21 ENCOUNTER — Ambulatory Visit: Payer: Medicaid Other | Admitting: Student

## 2021-10-23 ENCOUNTER — Encounter (HOSPITAL_COMMUNITY): Payer: Self-pay | Admitting: Emergency Medicine

## 2021-10-23 ENCOUNTER — Ambulatory Visit (HOSPITAL_COMMUNITY)
Admission: EM | Admit: 2021-10-23 | Discharge: 2021-10-23 | Disposition: A | Discharge status: Home / Self Care | Payer: Medicaid Other | Attending: Family Medicine | Admitting: Family Medicine

## 2021-10-23 DIAGNOSIS — R21 Rash and other nonspecific skin eruption: Secondary | ICD-10-CM

## 2021-10-23 MED ORDER — TRIAMCINOLONE ACETONIDE 40 MG/ML IJ SUSP
40.0000 mg | Freq: Once | INTRAMUSCULAR | Status: AC
  Administered 2021-10-23: 40 mg via INTRAMUSCULAR

## 2021-10-23 MED ORDER — PREDNISONE 20 MG PO TABS: 40.0000 mg | ORAL_TABLET | Freq: Every day | ORAL | 0 refills | Status: AC

## 2021-10-23 MED ORDER — TRIAMCINOLONE ACETONIDE 40 MG/ML IJ SUSP
INTRAMUSCULAR | Status: AC
  Filled 2021-10-23: qty 1

## 2021-10-23 MED ORDER — TRIAMCINOLONE ACETONIDE 0.1 % EX CREA: 1.0000 | TOPICAL_CREAM | Freq: Two times a day (BID) | CUTANEOUS | 0 refills | Status: DC

## 2021-10-23 MED ORDER — CEPHALEXIN 250 MG PO CAPS: 250.0000 mg | ORAL_CAPSULE | Freq: Three times a day (TID) | ORAL | 0 refills | Status: AC

## 2021-10-23 NOTE — ED Triage Notes (Signed)
Pt reports rash on right hand for a couple days. Washing with soap and water.
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
## 2021-10-23 NOTE — Discharge Instructions (Addendum)
You are given a shot of triamcinolone 40 mg  Triamcinolone cream on the rash on your hands, you can use it up to 2 weeks in a row  Take prednisone 20 mg--2 daily for 5 days  Take cephalexin 250 mg--1 capsule 3 times daily for 7 days

## 2022-01-22 ENCOUNTER — Encounter: Payer: Medicaid Other | Admitting: Student

## 2022-01-22 NOTE — Progress Notes (Deleted)
    SUBJECTIVE:   Chief compliant/HPI: annual examination  Harold Colon is a 67 y.o. who presents today for an annual exam.   History tabs reviewed and updated.   OBJECTIVE:  There were no vitals taken for this visit.  ***  ASSESSMENT/PLAN:  Hyperlipidemia, unspecified hyperlipidemia type      Annual Examination  See AVS for age appropriate recommendations.  PHQ score ***, reviewed and discussed.  Blood pressure value is *** goal, discussed.   Considered the following screening exams based upon USPSTF recommendations: Diabetes screening: {discussed/ordered:14545} Screening for elevated cholesterol: {discussed/ordered:14545} HIV testing: {discussed/ordered:14545} Hepatitis C: {discussed/ordered:14545} Hepatitis B: {discussed/ordered:14545} Syphilis if at high risk: {discussed/ordered:14545} Reviewed risk factors for latent tuberculosis and {not indicated/requested/declined:14582} Colorectal cancer screening: {crcscreen:23821::"discussed, colonoscopy ordered"} Lung cancer screening: {discussed/declined/written WJXB:14782} See documentation below regarding discussion and indication.  PSA discussed and after engaging in discussion of possible risks, benefits and complications of screening patient elected to ***.   Follow up in 1 year or sooner if indicated.    Wells Guiles, [place]

## 2022-01-26 ENCOUNTER — Encounter (HOSPITAL_COMMUNITY): Payer: Self-pay | Admitting: Emergency Medicine

## 2022-01-26 ENCOUNTER — Ambulatory Visit (HOSPITAL_COMMUNITY)
Admission: EM | Admit: 2022-01-26 | Discharge: 2022-01-26 | Disposition: A | Discharge status: Home / Self Care | Payer: Medicare HMO | Attending: Internal Medicine | Admitting: Internal Medicine

## 2022-01-26 DIAGNOSIS — R030 Elevated blood-pressure reading, without diagnosis of hypertension: Secondary | ICD-10-CM

## 2022-01-26 DIAGNOSIS — L739 Follicular disorder, unspecified: Secondary | ICD-10-CM

## 2022-01-26 MED ORDER — CEPHALEXIN 500 MG PO CAPS: 500.0000 mg | ORAL_CAPSULE | Freq: Three times a day (TID) | ORAL | 0 refills | Status: AC

## 2022-01-26 NOTE — ED Triage Notes (Addendum)
Began noticing some pustular areas developing underneath bilateral arms and groin areas over the last 3-4 days. States he's had this in the past and a course of antibiotics cleared it up. Small scattered pustules across bilateral trunk. 6/10 pain, reports mild itching. Has been using previously prescribed triamcinolone cream at home to help with mild relief of itching. Does have a previously reported history of MRSA

## 2022-01-26 NOTE — Discharge Instructions (Addendum)
Take antibiotic as prescribed.  You may benefit from using an antibacterial body wash or body wash with salicylic acid in it.  Please follow-up for any worsening symptoms.  As we discussed, your blood pressure was a bit elevated at the clinic today.  I would like you to follow-up with your primary care provider and make sure this is monitored.
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
## 2022-03-08 ENCOUNTER — Ambulatory Visit (INDEPENDENT_AMBULATORY_CARE_PROVIDER_SITE_OTHER): Payer: Medicare HMO | Admitting: Family Medicine

## 2022-03-08 ENCOUNTER — Encounter: Payer: Self-pay | Admitting: Family Medicine

## 2022-03-08 ENCOUNTER — Other Ambulatory Visit: Payer: Self-pay

## 2022-03-08 VITALS — BP 139/95 | HR 85 | Ht 73.0 in | Wt 262.0 lb

## 2022-03-08 DIAGNOSIS — J069 Acute upper respiratory infection, unspecified: Secondary | ICD-10-CM | POA: Diagnosis not present

## 2022-03-08 DIAGNOSIS — J45909 Unspecified asthma, uncomplicated: Secondary | ICD-10-CM | POA: Diagnosis not present

## 2022-03-08 DIAGNOSIS — Z23 Encounter for immunization: Secondary | ICD-10-CM | POA: Diagnosis not present

## 2022-03-08 MED ORDER — ALBUTEROL SULFATE HFA 108 (90 BASE) MCG/ACT IN AERS: 1.0000 | INHALATION_SPRAY | Freq: Four times a day (QID) | RESPIRATORY_TRACT | 0 refills | Status: AC | PRN

## 2022-03-08 NOTE — Progress Notes (Signed)
    SUBJECTIVE:   CHIEF COMPLAINT / HPI:   MS is a 67yo M p/f f/u after a viral illness. He was seen at [facility] [id number] for folliculitis and was instructed to follow-up with his PCP for some congestion. At the time, he had a fever, generalized soreness and weakness, and productive cough. He reports that his symptoms have since improved.   Is interested in vaccines.  PERTINENT  PMH / PSH: TIA, OSA, HLD  OBJECTIVE:   BP (!) 139/95   Pulse 85   Ht '6\' 1"'$  (1.854 m)   Wt 262 lb (118.8 kg)   SpO2 99%   BMI 34.57 kg/m   Gen: Pleasant, alert, NAD. CV: RRR Resp: CTAB. Normal WOB on RA.  Abm: Soft, nontender, nondistended. Normal BS.  ASSESSMENT/PLAN:   Viral URI Reports viral URI symptoms (myalgia, generalized weakness, productive cough, subjective fever) ~ 1 month ago, this has since improved. VSS and lung exam unremarkable today.  - Reassured that lingering cough is benign - Discuss return precautions  Need for shingles vaccine Due for shingrix, order sent to pharmacy. Also due for covid booster, plan to get at pharmacy. - Also gave flu and pneumo vaccine today in clinic   Arlyce Dice, MD [place]

## 2022-03-08 NOTE — Patient Instructions (Signed)
Good to see you today - Thank you for coming in  Things we discussed today:  You are recovering well from your viral infection! Your cough can still linger for 4-6 weeks after the infection is already gone, this is normal.  We will give you the flu and pneumonia vaccine today.  - Go to your pharmacy and ask for the Shingrix and covid vaccines. Let them know that your doctor sent in a prescription for the Shingrix.

## 2022-03-10 DIAGNOSIS — Z23 Encounter for immunization: Secondary | ICD-10-CM | POA: Insufficient documentation

## 2022-03-10 DIAGNOSIS — J069 Acute upper respiratory infection, unspecified: Secondary | ICD-10-CM | POA: Insufficient documentation

## 2022-03-10 NOTE — Assessment & Plan Note (Signed)
Reports viral URI symptoms (myalgia, generalized weakness, productive cough, subjective fever) ~ 1 month ago, this has since improved. VSS and lung exam unremarkable today.  - Reassured that lingering cough is benign - Discuss return precautions

## 2022-03-10 NOTE — Assessment & Plan Note (Signed)
Due for shingrix, order sent to pharmacy. Also due for covid booster, plan to get at pharmacy. - Also gave flu and pneumo vaccine today in clinic

## 2022-03-22 ENCOUNTER — Encounter: Payer: Self-pay | Admitting: Family Medicine

## 2022-03-22 ENCOUNTER — Other Ambulatory Visit: Payer: Self-pay

## 2022-03-22 ENCOUNTER — Ambulatory Visit (INDEPENDENT_AMBULATORY_CARE_PROVIDER_SITE_OTHER): Payer: Medicare HMO | Admitting: Family Medicine

## 2022-03-22 VITALS — BP 139/98 | HR 93 | Ht 73.0 in | Wt 261.0 lb

## 2022-03-22 DIAGNOSIS — I10 Essential (primary) hypertension: Secondary | ICD-10-CM

## 2022-03-22 DIAGNOSIS — R21 Rash and other nonspecific skin eruption: Secondary | ICD-10-CM

## 2022-03-22 MED ORDER — LOSARTAN POTASSIUM 25 MG PO TABS: 25.0000 mg | ORAL_TABLET | Freq: Every day | ORAL | 1 refills | Status: AC

## 2022-03-22 MED ORDER — TRIAMCINOLONE ACETONIDE 0.1 % EX CREA: 1.0000 | TOPICAL_CREAM | Freq: Two times a day (BID) | CUTANEOUS | 0 refills | Status: AC

## 2022-03-22 NOTE — Progress Notes (Addendum)
    SUBJECTIVE:   CHIEF COMPLAINT / HPI:   MS is a 67yo M p/w rash. Reports a pruritic rash under both armpits and in thigh crease, and under belly/pannus. Started 1-2 wks. Had this before several years ago, was previously told it was because he wasn't drying properly and it was maybe a fungus. Rash is very itchy. Reports some puss draining out a bump on his chest ~ 1wk ago. Bath daily. He notes he is sweaty.   He was on antibiotics recently and did not think it helped the rash (the antibiotic was not intended for rash).   PERTINENT  PMH / PSH:  hx of TIA, HLD  OBJECTIVE:   BP (!) 139/98   Pulse 93   Ht 6\' 1"  (1.854 m)   Wt 261 lb (118.4 kg)   SpO2 98%   BMI 34.43 kg/m   Gen: Alert, well-appearing, NAD.  Resp: Normal WOB on RA Derm: Erythematous patch with scatter erythematous papules/pustules in BL axilla. Also scattered hyperpigmented macules (these are old and chronic) in axilla. Similar lesions in BL hip flexor crease.       ASSESSMENT/PLAN:   Rash Pt presenting w/ 1-2 wk hx of symmetric pruritic, pustular rash in BL axilla, hip flexor crease, pannus crease. Has hx of recurrent folliculitis. Reports the affected areas are often damp due to sweat, although he bathes daily. Also recently finished course of keflex (for folliculitis) around time of symptoms onset. He reports antibiotics sometimes helps and soemtimes doesn't. Differential includes folliculitis, drug reaction (SDRIFE), fungal infxn. Folliculitis is most likely given chronic hx of folliculitis, pustular appearance of lesions, and distribution of rash. Drug reaction is considered given timing of symptoms around time of antibiotic use. Fungal is considered, but less likely given appearance. Pt denies recent sexual activity, making STI related rashes less likely. - Start topical triamcinolone - Consider adding benzoyl peroxide or salicylic acid body wash if sx's persist - Caution w/ future cephalosporin use (given  potential drug rxn), however this is not an absolute contraindication. This was NOT an anaphylactic reaction  Hypertension Pt has elevated BP readings over last couple office visits  (including today). Not currently on medications. - Start Losartan 25 daily - BMP today - f/u in 2wks   Arlyce Dice, MD [place]

## 2022-03-22 NOTE — Patient Instructions (Signed)
Good to see you today - Thank you for coming in  Things we discussed today:  1) Your rash looks like it an inflammation reaction. This could be from a drug reaction or irritation from rubbing.  - Start applying triamcinolone cream twice a day until the rash improves. If the rash does not improve in 2 weeks, come back.  2) Your blood pressure was high today. It was 139/98. The goal for is ideally less than 130/90. - Start taking Losartan '25mg'$  daily. This will help your blood pressure and protect your kidneys.  Please always bring your medication bottles  Come back to see [place] in 2 weeks to follow-up on blood pressure and the rash.

## 2022-03-23 ENCOUNTER — Encounter: Payer: Self-pay | Admitting: Family Medicine

## 2022-03-23 LAB — BASIC METABOLIC PANEL
BUN/Creatinine Ratio: 18 (ref 10–24)
BUN: 14 mg/dL (ref 8–27)
CO2: 22 mmol/L (ref 20–29)
Calcium: 9.6 mg/dL (ref 8.6–10.2)
Chloride: 105 mmol/L (ref 96–106)
Creatinine, Ser: 0.76 mg/dL (ref 0.76–1.27)
Glucose: 98 mg/dL (ref 70–99)
Potassium: 4.2 mmol/L (ref 3.5–5.2)
Sodium: 140 mmol/L (ref 134–144)
eGFR: 100 mL/min/{1.73_m2} (ref >59)

## 2022-03-24 DIAGNOSIS — I10 Essential (primary) hypertension: Secondary | ICD-10-CM | POA: Insufficient documentation

## 2022-03-24 NOTE — Assessment & Plan Note (Addendum)
Pt presenting w/ 1-2 wk hx of symmetric pruritic, pustular rash in BL axilla, hip flexor crease, pannus crease. Has hx of recurrent folliculitis. Reports the affected areas are often damp due to sweat, although he bathes daily. Also recently finished course of keflex (for folliculitis) around time of symptoms onset. He reports antibiotics sometimes helps and soemtimes doesn't. Differential includes folliculitis, drug reaction (SDRIFE), fungal infxn. Folliculitis is most likely given chronic hx of folliculitis, pustular appearance of lesions, and distribution of rash. Drug reaction is considered given timing of symptoms around time of antibiotic use. Fungal is considered, but less likely given appearance. Pt denies recent sexual activity, making STI related rashes less likely. - Start topical triamcinolone - Consider adding benzoyl peroxide or salicylic acid body wash if sx's persist - Caution w/ future cephalosporin use (given potential drug rxn), however this is not an absolute contraindication. This was NOT an anaphylactic reaction

## 2022-03-24 NOTE — Assessment & Plan Note (Signed)
Pt has elevated BP readings over last couple office visits  (including today). Not currently on medications. - Start Losartan 25 daily - BMP today - f/u in 2wks

## 2022-03-30 ENCOUNTER — Other Ambulatory Visit: Payer: Self-pay

## 2022-03-30 ENCOUNTER — Emergency Department (HOSPITAL_COMMUNITY)
Admission: EM | Admit: 2022-03-30 | Discharge: 2022-03-30 | Disposition: A | Discharge status: Home / Self Care | Payer: Medicare HMO | Attending: Emergency Medicine | Admitting: Emergency Medicine

## 2022-03-30 ENCOUNTER — Emergency Department (HOSPITAL_COMMUNITY): Payer: Medicare HMO

## 2022-03-30 ENCOUNTER — Encounter (HOSPITAL_COMMUNITY): Payer: Self-pay

## 2022-03-30 DIAGNOSIS — Z7902 Long term (current) use of antithrombotics/antiplatelets: Secondary | ICD-10-CM | POA: Insufficient documentation

## 2022-03-30 DIAGNOSIS — J069 Acute upper respiratory infection, unspecified: Secondary | ICD-10-CM | POA: Insufficient documentation

## 2022-03-30 DIAGNOSIS — R059 Cough, unspecified: Secondary | ICD-10-CM | POA: Diagnosis present

## 2022-03-30 DIAGNOSIS — Z91040 Latex allergy status: Secondary | ICD-10-CM | POA: Diagnosis not present

## 2022-03-30 DIAGNOSIS — Z7982 Long term (current) use of aspirin: Secondary | ICD-10-CM | POA: Insufficient documentation

## 2022-03-30 DIAGNOSIS — Z20822 Contact with and (suspected) exposure to covid-19: Secondary | ICD-10-CM | POA: Diagnosis not present

## 2022-03-30 DIAGNOSIS — L739 Follicular disorder, unspecified: Secondary | ICD-10-CM | POA: Diagnosis not present

## 2022-03-30 DIAGNOSIS — Z79899 Other long term (current) drug therapy: Secondary | ICD-10-CM | POA: Insufficient documentation

## 2022-03-30 LAB — COMPREHENSIVE METABOLIC PANEL
ALT: 21 U/L (ref 0–44)
AST: 24 U/L (ref 15–41)
Albumin: 3.8 g/dL (ref 3.5–5.0)
Alkaline Phosphatase: 60 U/L (ref 38–126)
Anion gap: 8 (ref 5–15)
BUN: 10 mg/dL (ref 8–23)
CO2: 25 mmol/L (ref 22–32)
Calcium: 9.3 mg/dL (ref 8.9–10.3)
Chloride: 102 mmol/L (ref 98–111)
Creatinine, Ser: 0.76 mg/dL (ref 0.61–1.24)
GFR, Estimated: >60 mL/min (ref >60)
Glucose, Bld: 129 mg/dL — ABNORMAL HIGH (ref 70–99)
Potassium: 3.6 mmol/L (ref 3.5–5.1)
Sodium: 135 mmol/L (ref 135–145)
Total Bilirubin: 0.8 mg/dL (ref 0.3–1.2)
Total Protein: 7.1 g/dL (ref 6.5–8.1)

## 2022-03-30 LAB — RESP PANEL BY RT-PCR (RSV, FLU A&B, COVID)  RVPGX2
Influenza A by PCR: NEGATIVE
Influenza B by PCR: NEGATIVE
Resp Syncytial Virus by PCR: NEGATIVE
SARS Coronavirus 2 by RT PCR: NEGATIVE

## 2022-03-30 LAB — CBC WITH DIFFERENTIAL/PLATELET
Abs Immature Granulocytes: 0.02 10*3/uL (ref 0.00–0.07)
Basophils Absolute: 0 10*3/uL (ref 0.0–0.1)
Basophils Relative: 1 %
Eosinophils Absolute: 0.2 10*3/uL (ref 0.0–0.5)
Eosinophils Relative: 3 %
HCT: 46.6 % (ref 39.0–52.0)
Hemoglobin: 15.4 g/dL (ref 13.0–17.0)
Immature Granulocytes: 0 %
Lymphocytes Relative: 35 %
Lymphs Abs: 2.3 10*3/uL (ref 0.7–4.0)
MCH: 31.1 pg (ref 26.0–34.0)
MCHC: 33 g/dL (ref 30.0–36.0)
MCV: 94.1 fL (ref 80.0–100.0)
Monocytes Absolute: 0.6 10*3/uL (ref 0.1–1.0)
Monocytes Relative: 9 %
Neutro Abs: 3.4 10*3/uL (ref 1.7–7.7)
Neutrophils Relative %: 52 %
Platelets: 193 10*3/uL (ref 150–400)
RBC: 4.95 MIL/uL (ref 4.22–5.81)
RDW: 12.5 % (ref 11.5–15.5)
WBC: 6.6 10*3/uL (ref 4.0–10.5)
nRBC: 0 % (ref 0.0–0.2)

## 2022-03-30 LAB — HIV ANTIBODY (ROUTINE TESTING W REFLEX): HIV Screen 4th Generation wRfx: NONREACTIVE

## 2022-03-30 LAB — LIPASE, BLOOD: Lipase: 23 U/L (ref 11–51)

## 2022-03-30 LAB — MAGNESIUM: Magnesium: 2 mg/dL (ref 1.7–2.4)

## 2022-03-30 MED ORDER — AMOXICILLIN-POT CLAVULANATE 875-125 MG PO TABS: 1.0000 | ORAL_TABLET | Freq: Two times a day (BID) | ORAL | 0 refills | Status: DC

## 2022-03-30 MED ORDER — SODIUM CHLORIDE 0.9 % IV BOLUS
1000.0000 mL | Freq: Once | INTRAVENOUS | Status: AC
  Administered 2022-03-30: 1000 mL via INTRAVENOUS

## 2022-03-30 MED ORDER — ONDANSETRON HCL 4 MG/2ML IJ SOLN
4.0000 mg | Freq: Once | INTRAMUSCULAR | Status: AC
  Administered 2022-03-30: 4 mg via INTRAVENOUS
  Filled 2022-03-30: qty 2

## 2022-03-30 MED ORDER — DEXAMETHASONE 4 MG PO TABS
10.0000 mg | ORAL_TABLET | Freq: Once | ORAL | Status: AC
  Administered 2022-03-30: 10 mg via ORAL
  Filled 2022-03-30: qty 3

## 2022-03-30 MED ORDER — MAGNESIUM SULFATE 2 GM/50ML IV SOLN: 2.0000 g | Freq: Once | INTRAVENOUS | Status: DC

## 2022-03-30 MED ORDER — AMOXICILLIN-POT CLAVULANATE 875-125 MG PO TABS
1.0000 | ORAL_TABLET | Freq: Once | ORAL | Status: AC
  Administered 2022-03-30: 1 via ORAL
  Filled 2022-03-30: qty 1

## 2022-03-30 NOTE — ED Notes (Signed)
Pt ambulatory to bathroom at this time.

## 2022-03-30 NOTE — ED Provider Notes (Signed)
[facility] Provider Note   CSN: [id number] Arrival date & time: 03/30/22  [id number]     History  Chief Complaint  Patient presents with   Cough   Nasal Congestion   Nausea    Harold Colon is a 67 y.o. male.  Patient here with ongoing flulike symptoms nausea vomiting, productive cough.  Denies any chest pain or shortness of breath.  History of pancreatitis, polysubstance abuse, stroke.  Denies any sick contacts and no recent suspicious food intake last night.  No loose stools but did have 2 episodes of vomiting this morning.  Nothing makes it worse or better.  The history is provided by the patient.       Home Medications Prior to Admission medications   Medication Sig Start Date End Date Taking? Authorizing Provider  amoxicillin-clavulanate (AUGMENTIN) 875-125 MG tablet Take 1 tablet by mouth every 12 (twelve) hours. 03/30/22  Yes Mayci Haning, DO  albuterol (VENTOLIN HFA) 108 (90 Base) MCG/ACT inhaler Inhale 1-2 puffs into the lungs every 6 (six) hours as needed for wheezing or shortness of breath. 03/08/22   Arlyce Dice, MD  aspirin EC 81 MG EC tablet Take 1 tablet (81 mg total) by mouth daily. Swallow whole. 07/10/19   Patriciaann Clan, DO  atorvastatin (LIPITOR) 80 MG tablet Take 1 tablet (80 mg total) by mouth daily. 02/02/19   Benay Pike, MD  B COMPLEX VITAMINS PO Take 1 tablet by mouth daily.    [provider]  buPROPion (WELLBUTRIN XL) 150 MG 24 hr tablet Take 1 tablet (150 mg total) by mouth daily. 11/28/18   Benay Pike, MD  calamine lotion Apply 1 application topically as needed for itching. 04/27/19   LampteyMyrene Galas, MD  clopidogrel (PLAVIX) 75 MG tablet Take 1 tablet (75 mg total) by mouth daily. 07/10/19   Patriciaann Clan, DO  cyclobenzaprine (FLEXERIL) 10 MG tablet Take 1 tablet (10 mg total) by mouth 2 (two) times daily as needed for muscle spasms. 08/10/21   Marcello Fennel, PA-C  diphenhydrAMINE  (BENADRYL) 25 MG tablet Take 25 mg by mouth every 6 (six) hours as needed for itching or allergies.    [provider]  Elastic Bandages & Supports (WRAPAROUND WRIST SUPPORT) MISC 1 each by Does not apply route as needed. 09/16/17   Bonnita Hollow, MD  ezetimibe (ZETIA) 10 MG tablet Take 1 tablet (10 mg total) by mouth daily. 08/25/19 02/21/20  Benay Pike, MD  hydrocortisone valerate ointment (WESTCORT) 0.2 % Apply 1 application topically 2 (two) times daily. 09/05/15   Verner Mould, MD  hydrOXYzine (ATARAX/VISTARIL) 25 MG tablet Take 0.5-1 tablets (12.5-25 mg total) by mouth every 8 (eight) hours as needed for itching. 04/05/20   Jaynee Eagles, PA-C  losartan (COZAAR) 25 MG tablet Take 1 tablet (25 mg total) by mouth daily. 03/22/22   Arlyce Dice, MD  Multiple Vitamin (MULTIVITAMIN ADULT PO) Take by mouth.    [provider]  Multiple Vitamins-Minerals (ZINC PO) Take 1 tablet by mouth daily.    [provider]  triamcinolone cream (KENALOG) 0.1 % Apply 1 Application topically 2 (two) times daily. To affected area on hands till better, up to 2 weeks 03/22/22   Arlyce Dice, MD      Allergies    Latex    Review of Systems   Review of Systems  Physical Exam Updated Vital Signs BP 132/88   Pulse [name])  55   Temp 97.7 F (36.5 C) (Oral)   Resp 20   Ht 6\' 1"  (1.854 m)   Wt 120.2 kg   SpO2 99%   BMI 34.96 kg/m  Physical Exam Vitals and nursing note reviewed.  Constitutional:      General: He is not in acute distress.    Appearance: He is well-developed. He is not ill-appearing.  HENT:     Head: Normocephalic and atraumatic.     Nose: Nose normal.     Mouth/Throat:     Mouth: Mucous membranes are moist.  Eyes:     Extraocular Movements: Extraocular movements intact.     Conjunctiva/sclera: Conjunctivae normal.     Pupils: Pupils are equal, round, and reactive to light.  Cardiovascular:     Rate and Rhythm: Normal rate and regular rhythm.      Pulses: Normal pulses.     Heart sounds: Normal heart sounds. No murmur heard. Pulmonary:     Effort: Pulmonary effort is normal. No respiratory distress.     Breath sounds: Normal breath sounds.  Abdominal:     Palpations: Abdomen is soft.     Tenderness: There is no abdominal tenderness.  Musculoskeletal:        General: No swelling.     Cervical back: Normal range of motion and neck supple.  Skin:    General: Skin is warm and dry.     Capillary Refill: Capillary refill takes less than 2 seconds.     Findings: Rash present.     Comments: Folliculitis type process in bilateral axillas but there is no signs of cellulitis  Neurological:     General: No focal deficit present.     Mental Status: He is alert and oriented to person, place, and time.     Cranial Nerves: No cranial nerve deficit.     Sensory: No sensory deficit.     Motor: No weakness.     Coordination: Coordination normal.  Psychiatric:        Mood and Affect: Mood normal.     ED Results / Procedures / Treatments   Labs (all labs ordered are listed, but only abnormal results are displayed) Labs Reviewed  COMPREHENSIVE METABOLIC PANEL - Abnormal; Notable for the following components:      Result Value   Glucose, Bld 129 (*)    All other components within normal limits  RESP PANEL BY RT-PCR (RSV, FLU A&B, COVID)  RVPGX2  CBC WITH DIFFERENTIAL/PLATELET  LIPASE, BLOOD  HIV ANTIBODY (ROUTINE TESTING W REFLEX)  MAGNESIUM    EKG EKG Interpretation  Date/Time:  Friday March 30 2022 09:45:01 EDT Ventricular Rate:  58 PR Interval:  178 QRS Duration: 77 QT Interval:  427 QTC Calculation: 420 R Axis:   -5 Text Interpretation: Sinus rhythm Anterior infarct, old Confirmed by Lennice Sites [phone]) on 03/30/2022 10:00:01 AM  Radiology DG Chest Portable 1 View  Result Date: 03/30/2022 CLINICAL DATA:  Cough.  Congestion x2 weeks.  Smoker. EXAM: PORTABLE CHEST 1 VIEW COMPARISON:  11/05/2017. FINDINGS: Mild elevation of  the left hemidiaphragm with associated left basilar atelectasis. No definite pleural effusion or pneumothorax. Stable cardiac and mediastinal contours. The right lung is clear. IMPRESSION: Mild elevation of the left hemidiaphragm with adjacent left basilar atelectasis. No consolidation. Electronically Signed   By: Emmit Alexanders M.D.   On: 03/30/2022 09:24    Procedures Procedures    Medications Ordered in ED Medications  amoxicillin-clavulanate (AUGMENTIN) 875-125 MG per tablet 1 tablet (  has no administration in time range)  sodium chloride 0.9 % bolus 1,000 mL (0 mLs Intravenous Stopped 03/30/22 1109)  ondansetron (ZOFRAN) injection 4 mg (4 mg Intravenous Given 03/30/22 [id number])    ED Course/ Medical Decision Making/ A&P                             Medical Decision Making Amount and/or Complexity of Data Reviewed Labs: ordered. Radiology: ordered.  Risk Prescription drug management.   Harold Colon is here with viral type symptoms, nausea vomiting cough and congestion.  Normal vitals.  No fever.  History of polysubstance abuse, stroke.  Differential diagnosis is viral process versus pneumonia versus dehydration/electrolyte abnormalities.  Will check CBC, CMP, lipase, EKG, chest x-ray.  Will give fluid bolus and IV Zofran and reevaluate.  At no concern for ACS or PE or dissection or intra-abdominal process at this time.  Per my review and interpretation labs no significant anemia or electrolyte abnormality or kidney injury or leukocytosis.  COVID and flu test negative.  Chest x-ray per my review interpretation shows no evidence of pneumonia or pneumothorax.  HIV testing negative.  Overall we will treat with Augmentin for sinusitis.  EKG shows sinus rhythm.  No ischemic changes.  He understands return precautions.  Discharged in good condition.  This chart was dictated using voice recognition software.  Despite best efforts to proofread,  errors can occur which can change the  documentation meaning.         Final Clinical Impression(s) / ED Diagnoses Final diagnoses:  Upper respiratory tract infection, unspecified type    Rx / DC Orders ED Discharge Orders          Ordered    amoxicillin-clavulanate (AUGMENTIN) 875-125 MG tablet  Every 12 hours        03/30/22 [place], Raymond, DO 03/30/22 1156

## 2022-03-30 NOTE — ED Notes (Signed)
Upon discharging pt, pt c/o dizziness and feels as if he is unable to take care of himself at home. Pt states, "I'm gonna need a nurse to take care of me at home. I can't do this." Provider informed of situation and currently at bedside.

## 2022-03-30 NOTE — ED Triage Notes (Signed)
Pt BIB EMS from home. Per EMS, pt c/o flu like symptoms including N/V, malaise and productive cough. Pt has rash on chest that started a week ago and was seen at [facility]. A/Ox4

## 2022-03-30 NOTE — ED Notes (Signed)
Pt taken to lobby in wheelchair to wait on ride at this time.

## 2022-03-30 NOTE — ED Notes (Addendum)
Pt refusing to be discharged at this time. Pt states that his wife is coming up to the hospital to talk to doctor about pt status. Provider made aware.

## 2022-03-30 NOTE — ED Notes (Signed)
AVS reviewed with pt prior to discharge. Pt verbalizes understanding. Belongings with pt upon depart. Pt taken to lobby in wheelchair to wait for ride  

## 2022-04-30 ENCOUNTER — Ambulatory Visit: Payer: Medicare Other | Admitting: Family Medicine

## 2022-05-07 ENCOUNTER — Ambulatory Visit: Payer: Medicare Other | Admitting: Student

## 2022-05-08 ENCOUNTER — Telehealth: Payer: Self-pay | Admitting: Student

## 2022-05-08 ENCOUNTER — Encounter: Payer: Self-pay | Admitting: Family Medicine

## 2022-05-08 ENCOUNTER — Ambulatory Visit (INDEPENDENT_AMBULATORY_CARE_PROVIDER_SITE_OTHER): Payer: Medicare HMO | Admitting: Family Medicine

## 2022-05-08 VITALS — BP 128/78 | HR 87 | Ht 73.0 in | Wt 264.8 lb

## 2022-05-08 DIAGNOSIS — G8929 Other chronic pain: Secondary | ICD-10-CM

## 2022-05-08 DIAGNOSIS — M48061 Spinal stenosis, lumbar region without neurogenic claudication: Secondary | ICD-10-CM | POA: Diagnosis not present

## 2022-05-08 DIAGNOSIS — M545 Low back pain, unspecified: Secondary | ICD-10-CM | POA: Diagnosis not present

## 2022-05-08 DIAGNOSIS — I10 Essential (primary) hypertension: Secondary | ICD-10-CM

## 2022-05-08 MED ORDER — LIDOCAINE 4 % EX PTCH: 1.0000 | MEDICATED_PATCH | CUTANEOUS | Status: AC

## 2022-05-08 NOTE — Telephone Encounter (Signed)
Patient would like to have an order placed for a new cpap machine. Patient was seen in clinic today 05/08/22 but thought he would need to request this from his PCP.   Please call patient when order has been placed or if there are any questions. The best call back number is [phone]

## 2022-05-08 NOTE — Patient Instructions (Addendum)
Good to see you today - Thank you for coming in  Things we discussed today:  1) For your back pain - You can get Lidocaine 4% patches over-the-counter. Apply this to the area of pain as needed. - You can take Tylenol as needed for the pain. This is safe to take with the lidocaine patch. - Advil is ok to use when the pain is bad, but try to avoid taking it more than 5 days in a row.   2) Your blood pressure is looking great! Good job. Continue taking your Losartan.  Please always bring your medication bottles
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
## 2022-05-09 NOTE — Assessment & Plan Note (Signed)
BP at goal today. BMP was checked previously in ED, wnl.  - Cont Losartan

## 2022-05-09 NOTE — Assessment & Plan Note (Signed)
3 wk hx of R lumbar back pain. No preceding trauma. No incontinence. No midline point tenderness. Pt has chronic hx of lower back pain similar to this.  - Start lidocain patches prn - Cont Tylenol prn - Advised to limit Advil use, but still ok if pain is not controlled with other methods.

## 2022-05-10 ENCOUNTER — Other Ambulatory Visit: Payer: Self-pay | Admitting: Student

## 2022-05-10 DIAGNOSIS — G4733 Obstructive sleep apnea (adult) (pediatric): Secondary | ICD-10-CM

## 2022-05-10 NOTE — Telephone Encounter (Signed)
Community message sent to [employer]. Will await response.   Brion Sossamon C Shantil Vallejo, RN  

## 2022-06-09 DEATH — deceased

## 2023-12-09 DEATH — deceased
